# Patient Record
Sex: Female | Born: 1940
Health system: Southern US, Community
[De-identification: ages and names within clinical notes are randomized; demographics above are authoritative.]

## PROBLEM LIST (undated history)

## (undated) DIAGNOSIS — I10 Essential (primary) hypertension: Secondary | ICD-10-CM

## (undated) DIAGNOSIS — N200 Calculus of kidney: Secondary | ICD-10-CM

## (undated) DIAGNOSIS — E119 Type 2 diabetes mellitus without complications: Secondary | ICD-10-CM

## (undated) HISTORY — PX: APPENDECTOMY: SHX54

## (undated) HISTORY — PX: CHOLECYSTECTOMY: SHX55

## (undated) HISTORY — PX: ABDOMINAL HYSTERECTOMY: SHX81

---

## 2000-04-23 ENCOUNTER — Encounter: Payer: Self-pay | Admitting: Family Medicine

## 2000-04-23 ENCOUNTER — Ambulatory Visit (HOSPITAL_COMMUNITY): Admission: RE | Admit: 2000-04-23 | Discharge: 2000-04-23 | Payer: Self-pay | Admitting: Family Medicine

## 2000-12-10 ENCOUNTER — Encounter: Payer: Self-pay | Admitting: Emergency Medicine

## 2000-12-10 ENCOUNTER — Inpatient Hospital Stay (HOSPITAL_COMMUNITY): Admission: EM | Admit: 2000-12-10 | Discharge: 2000-12-12 | Payer: Self-pay | Admitting: Emergency Medicine

## 2000-12-22 ENCOUNTER — Encounter: Payer: Self-pay | Admitting: General Surgery

## 2000-12-28 ENCOUNTER — Encounter (INDEPENDENT_AMBULATORY_CARE_PROVIDER_SITE_OTHER): Payer: Self-pay

## 2000-12-28 ENCOUNTER — Inpatient Hospital Stay (HOSPITAL_COMMUNITY): Admission: RE | Admit: 2000-12-28 | Discharge: 2001-01-06 | Payer: Self-pay | Admitting: General Surgery

## 2001-02-11 ENCOUNTER — Ambulatory Visit (HOSPITAL_COMMUNITY): Admission: RE | Admit: 2001-02-11 | Discharge: 2001-02-11 | Payer: Self-pay | Admitting: *Deleted

## 2001-02-11 ENCOUNTER — Encounter: Payer: Self-pay | Admitting: *Deleted

## 2001-03-23 ENCOUNTER — Encounter: Payer: Self-pay | Admitting: *Deleted

## 2001-03-23 ENCOUNTER — Ambulatory Visit (HOSPITAL_COMMUNITY): Admission: RE | Admit: 2001-03-23 | Discharge: 2001-03-23 | Payer: Self-pay | Admitting: Oncology

## 2001-05-11 ENCOUNTER — Other Ambulatory Visit: Admission: RE | Admit: 2001-05-11 | Discharge: 2001-05-11 | Payer: Self-pay | Admitting: Family Medicine

## 2001-06-01 ENCOUNTER — Encounter: Payer: Self-pay | Admitting: Family Medicine

## 2001-06-01 ENCOUNTER — Ambulatory Visit (HOSPITAL_COMMUNITY): Admission: RE | Admit: 2001-06-01 | Discharge: 2001-06-01 | Payer: Self-pay | Admitting: Family Medicine

## 2001-07-09 ENCOUNTER — Ambulatory Visit (HOSPITAL_COMMUNITY): Admission: RE | Admit: 2001-07-09 | Discharge: 2001-07-09 | Payer: Self-pay | Admitting: *Deleted

## 2001-07-09 ENCOUNTER — Encounter: Payer: Self-pay | Admitting: *Deleted

## 2001-08-31 ENCOUNTER — Encounter: Payer: Self-pay | Admitting: *Deleted

## 2001-08-31 ENCOUNTER — Ambulatory Visit (HOSPITAL_COMMUNITY): Admission: RE | Admit: 2001-08-31 | Discharge: 2001-08-31 | Payer: Self-pay | Admitting: *Deleted

## 2001-10-26 ENCOUNTER — Ambulatory Visit (HOSPITAL_COMMUNITY): Admission: RE | Admit: 2001-10-26 | Discharge: 2001-10-26 | Payer: Self-pay | Admitting: *Deleted

## 2001-10-26 ENCOUNTER — Encounter: Payer: Self-pay | Admitting: *Deleted

## 2001-11-22 ENCOUNTER — Ambulatory Visit (HOSPITAL_COMMUNITY): Admission: RE | Admit: 2001-11-22 | Discharge: 2001-11-22 | Payer: Self-pay | Admitting: Family Medicine

## 2001-11-22 ENCOUNTER — Encounter: Payer: Self-pay | Admitting: Family Medicine

## 2002-01-20 ENCOUNTER — Encounter: Payer: Self-pay | Admitting: General Surgery

## 2002-01-28 ENCOUNTER — Inpatient Hospital Stay (HOSPITAL_COMMUNITY): Admission: RE | Admit: 2002-01-28 | Discharge: 2002-01-30 | Payer: Self-pay | Admitting: General Surgery

## 2002-02-22 ENCOUNTER — Encounter: Payer: Self-pay | Admitting: General Surgery

## 2002-02-22 ENCOUNTER — Ambulatory Visit (HOSPITAL_COMMUNITY): Admission: RE | Admit: 2002-02-22 | Discharge: 2002-02-22 | Payer: Self-pay | Admitting: General Surgery

## 2002-03-28 ENCOUNTER — Encounter: Payer: Self-pay | Admitting: Thoracic Surgery

## 2002-03-30 ENCOUNTER — Ambulatory Visit (HOSPITAL_COMMUNITY): Admission: RE | Admit: 2002-03-30 | Discharge: 2002-03-30 | Payer: Self-pay | Admitting: Thoracic Surgery

## 2002-03-30 ENCOUNTER — Encounter (INDEPENDENT_AMBULATORY_CARE_PROVIDER_SITE_OTHER): Payer: Self-pay | Admitting: Specialist

## 2002-03-30 ENCOUNTER — Encounter (INDEPENDENT_AMBULATORY_CARE_PROVIDER_SITE_OTHER): Payer: Self-pay | Admitting: *Deleted

## 2002-08-17 ENCOUNTER — Encounter: Payer: Self-pay | Admitting: *Deleted

## 2002-08-17 ENCOUNTER — Encounter: Payer: Self-pay | Admitting: Emergency Medicine

## 2002-08-17 ENCOUNTER — Inpatient Hospital Stay (HOSPITAL_COMMUNITY): Admission: EM | Admit: 2002-08-17 | Discharge: 2002-08-19 | Payer: Self-pay | Admitting: Emergency Medicine

## 2002-11-10 ENCOUNTER — Ambulatory Visit (HOSPITAL_BASED_OUTPATIENT_CLINIC_OR_DEPARTMENT_OTHER): Admission: RE | Admit: 2002-11-10 | Discharge: 2002-11-10 | Payer: Self-pay | Admitting: Family Medicine

## 2003-02-23 ENCOUNTER — Ambulatory Visit (HOSPITAL_COMMUNITY): Admission: RE | Admit: 2003-02-23 | Discharge: 2003-02-23 | Payer: Self-pay | Admitting: Oncology

## 2003-02-23 ENCOUNTER — Encounter: Payer: Self-pay | Admitting: Oncology

## 2003-04-18 ENCOUNTER — Ambulatory Visit (HOSPITAL_COMMUNITY): Admission: RE | Admit: 2003-04-18 | Discharge: 2003-04-18 | Payer: Self-pay | Admitting: Family Medicine

## 2003-05-09 ENCOUNTER — Ambulatory Visit (HOSPITAL_COMMUNITY): Admission: RE | Admit: 2003-05-09 | Discharge: 2003-05-09 | Payer: Self-pay | Admitting: Unknown Physician Specialty

## 2003-08-05 ENCOUNTER — Inpatient Hospital Stay (HOSPITAL_COMMUNITY): Admission: RE | Admit: 2003-08-05 | Discharge: 2003-08-07 | Payer: Self-pay | Admitting: General Surgery

## 2003-09-28 ENCOUNTER — Ambulatory Visit (HOSPITAL_COMMUNITY): Admission: RE | Admit: 2003-09-28 | Discharge: 2003-09-28 | Payer: Self-pay | Admitting: Oncology

## 2004-03-28 ENCOUNTER — Ambulatory Visit (HOSPITAL_COMMUNITY): Admission: RE | Admit: 2004-03-28 | Discharge: 2004-03-28 | Payer: Self-pay | Admitting: Oncology

## 2004-08-09 ENCOUNTER — Ambulatory Visit (HOSPITAL_COMMUNITY): Admission: RE | Admit: 2004-08-09 | Discharge: 2004-08-09 | Payer: Self-pay | Admitting: Neurology

## 2004-09-25 ENCOUNTER — Ambulatory Visit: Payer: Self-pay | Admitting: Oncology

## 2004-09-30 ENCOUNTER — Ambulatory Visit (HOSPITAL_COMMUNITY): Admission: RE | Admit: 2004-09-30 | Discharge: 2004-09-30 | Payer: Self-pay | Admitting: Oncology

## 2005-02-02 IMAGING — CT CT ABDOMEN W/ CM
2 of 3 series · 12 of 32 positions shown, 17 images · IV contrast (agent unspecified)
Comparison: 10/26/01.

FINDINGS
CLINICAL DATA: EPIGASTRIC PAIN.
CT ABDOMEN AND PELVIS WITH CONTRAST

[Series 2: chest/pe 3.0 b10f · axial · 0.61mm/px · z∈[-89,+79]mm · 8 of 70 slices shown]
[im 7/70  soft-tissue]
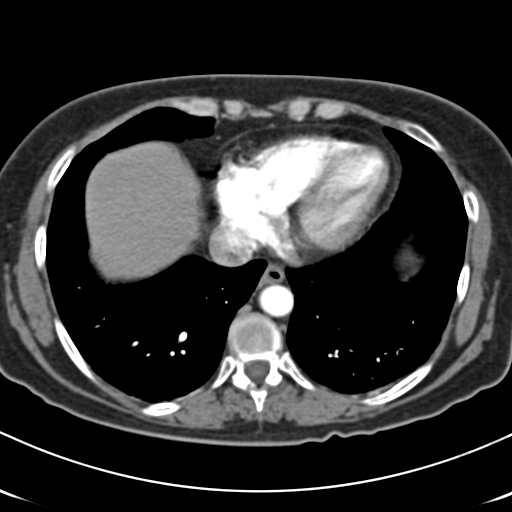
[im 13/70  soft-tissue]
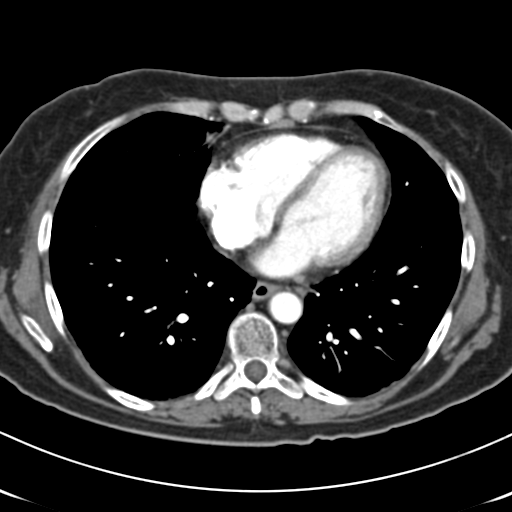
[im 22/70  soft-tissue]
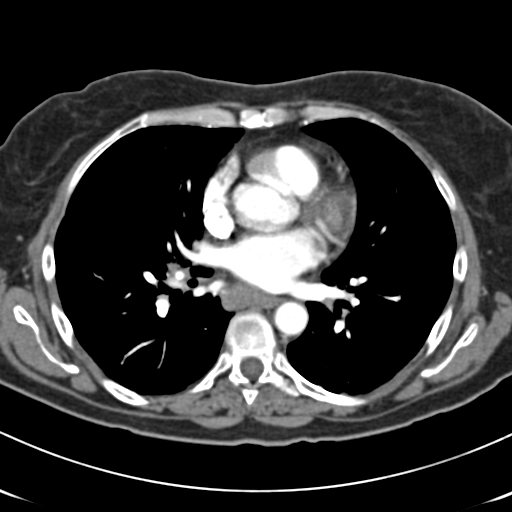
[im 32/70  soft-tissue]
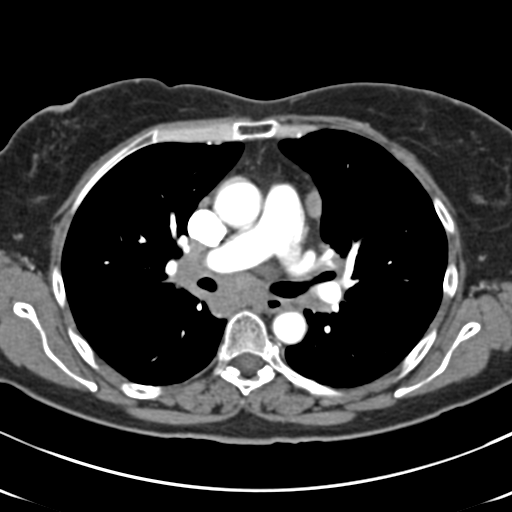
[im 38/70  soft-tissue]
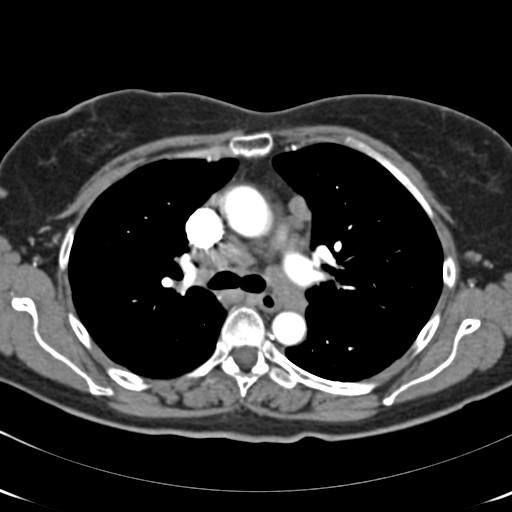
[im 48/70  soft-tissue]
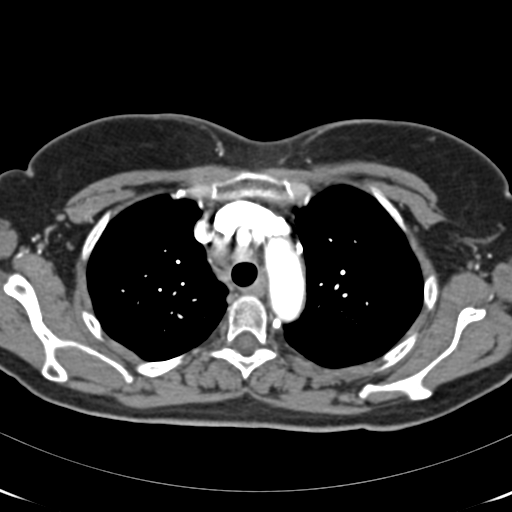
[im 57/70  soft-tissue]
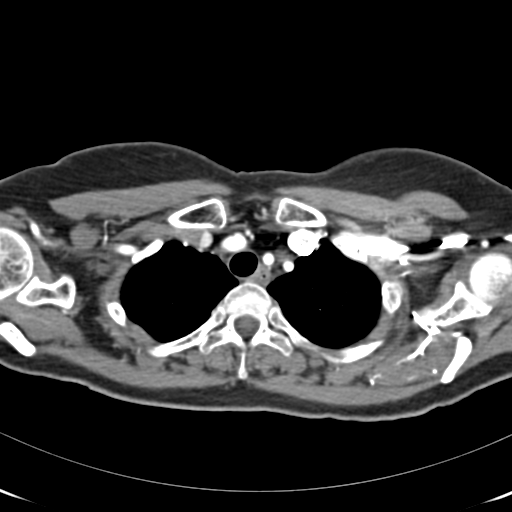
[im 63/70  soft-tissue]
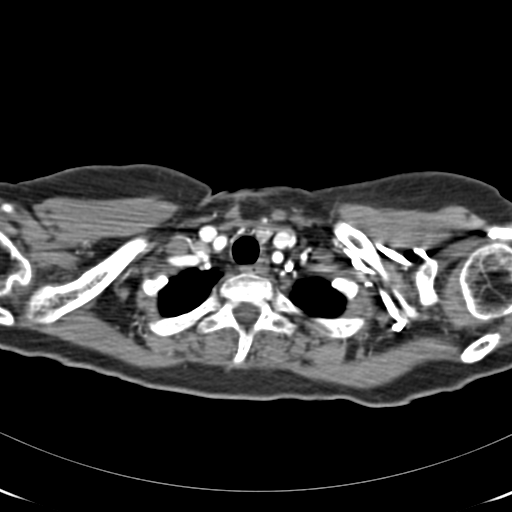

[Series 5: liv to kidn 5.0 b10f · axial · 0.61mm/px · z∈[-182,-127]mm · 4 of 19 slices shown, 9 images]
[im 4/19  soft-tissue]
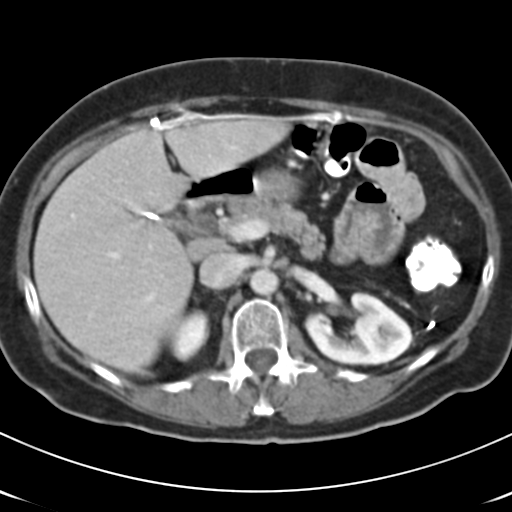
[im 4/19  lung]
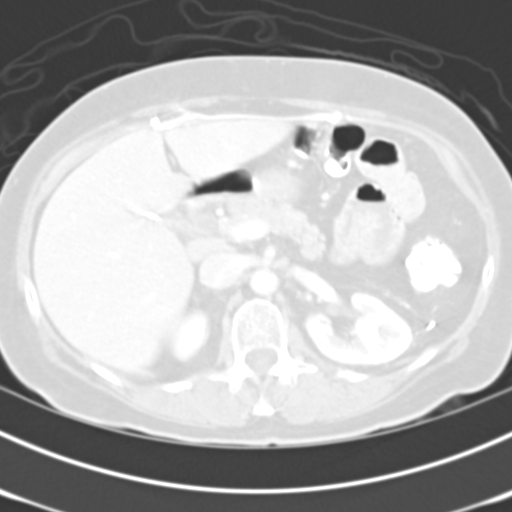
[im 4/19  bone]
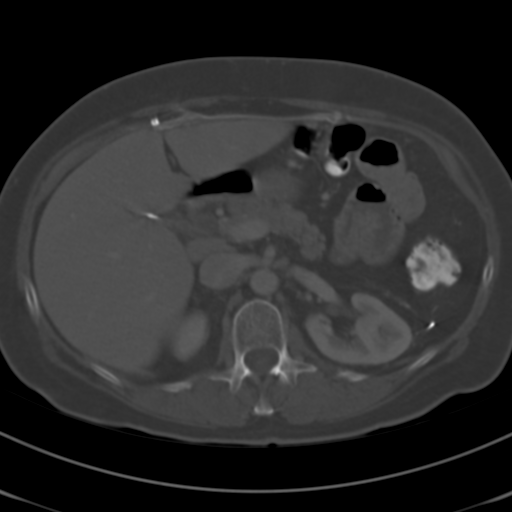
[im 8/19  soft-tissue]
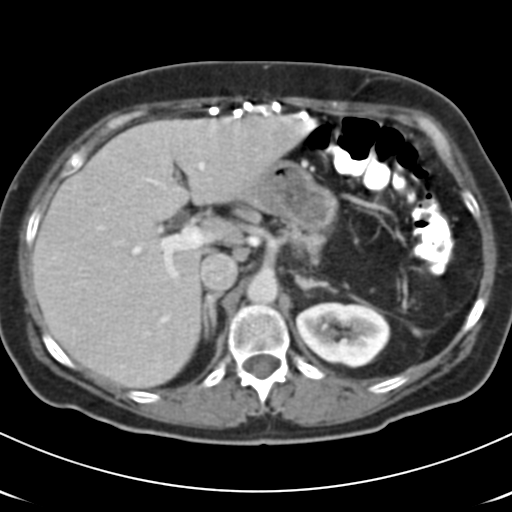
[im 8/19  lung]
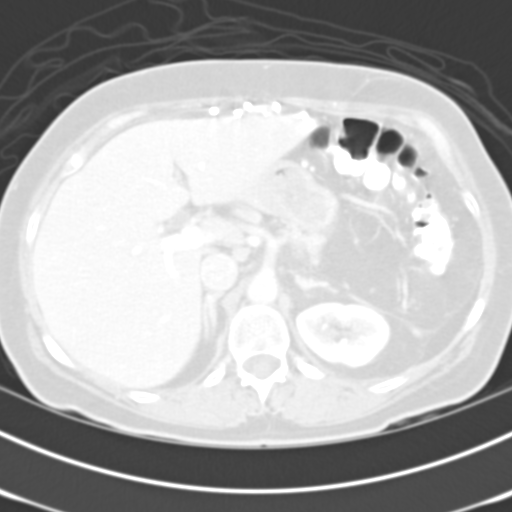
[im 11/19  soft-tissue]
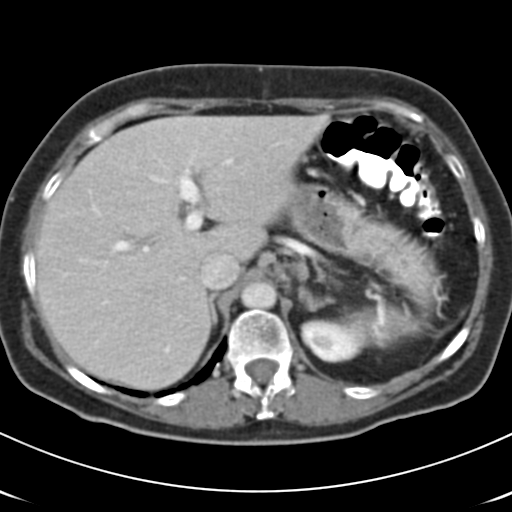
[im 11/19  lung]
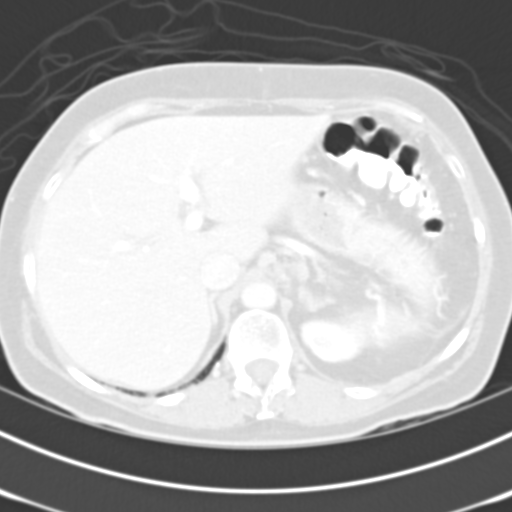
[im 15/19  soft-tissue]
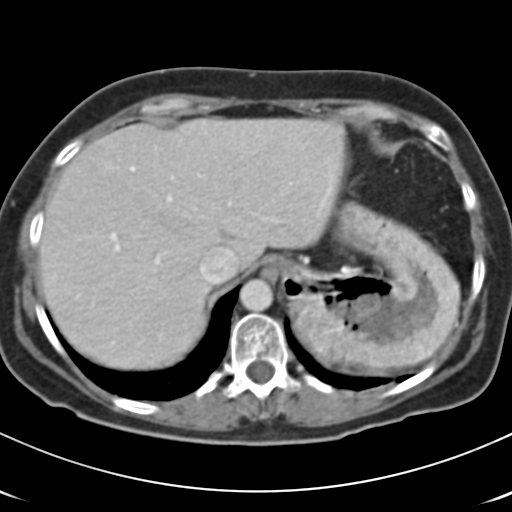
[im 15/19  lung]
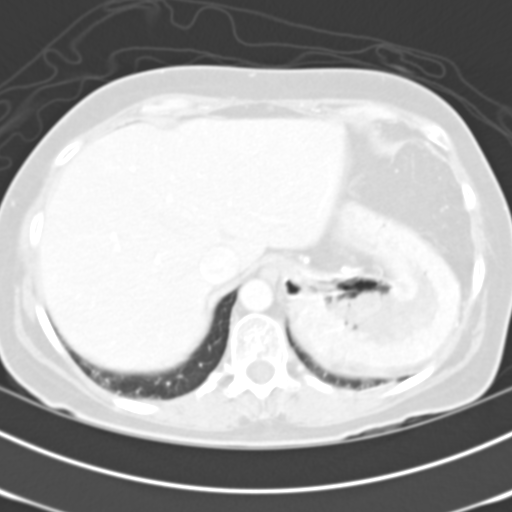

[12 of 32 positions shown; findings below may reference images not displayed]

AFTER THE INTRAVENOUS INJECTION OF 150 CC OMNIPAQUE 300, 5 MM SPIRAL IMAGES WERE OBTAINED THROUGH
THE ABDOMEN AND PELVIS.
CT ABDOMEN
ONCE AGAIN, THE GALLBLADDER, PANCREATIC TAIL AND SPLEEN ARE SURGICALLY ABSENT.  PERIPORTAL
ADENOPATHY IS STABLE.  ON IMAGE 21 THERE IS A 9 MM SHORT-AXIS DIAMETER LYMPH NODE IN THE PERIPORTAL
REGION.  ON IMAGE 24 THERE IS A 1.2 CM SHORT-AXIS DIAMETER PORTOCAVAL LYMPH NODE.  PARA-AORTIC VERY
SMALL LYMPH NODES ARE NOTED, AND ARE ALSO STABLE.  THE LARGEST IS ON IMAGE 32 AND MEASURES
APPROXIMATELY 8 MM IN SHORT-AXIS DIAMETER.  THE GALLBLADDER IS AGAIN NOTED TO BE SURGICALLY ABSENT,
WITH PROMINENCE OF THE COMMON BILE DUCT SECONDARY TO CHOLECYSTECTOMY.  THE ANTERIOR ABDOMINAL WALL
HERNIA HAS BEEN SURGICALLY FIXED WITH MESH.  THE KIDNEYS AND ADRENAL GLANDS ARE WITHIN NORMAL
LIMITS.  THERE IS NO EVIDENCE OF FREE FLUID.
IMPRESSION
1.  POST-SURGICAL CHANGE, AS DESCRIBED, WITH RESECTION OF THE PANCREATIC TAIL AND SPLEEN.
2.  STABLE PERIPORTAL ADENOPATHY.
3.  VENTRAL ABDOMINAL WALL HERNIORRHAPHY.
4.  EXAM OTHERWISE STABLE.
CT PELVIS
IN THE PELVIS THE BLADDER IS DISTENDED.  THERE IS NO EVIDENCE OF FLUID.  THERE IS NO EVIDENCE OF
ADENOPATHY. THE UTERUS IS SURGICALLY ABSENT.
IMPRESSION
NO EVIDENCE OF ACUTE INTRAPELVIC DISEASE.

## 2005-03-18 ENCOUNTER — Observation Stay (HOSPITAL_COMMUNITY): Admission: EM | Admit: 2005-03-18 | Discharge: 2005-03-19 | Payer: Self-pay | Admitting: Emergency Medicine

## 2005-04-04 ENCOUNTER — Ambulatory Visit: Payer: Self-pay | Admitting: Oncology

## 2005-04-09 ENCOUNTER — Ambulatory Visit (HOSPITAL_COMMUNITY): Admission: RE | Admit: 2005-04-09 | Discharge: 2005-04-09 | Payer: Self-pay | Admitting: Pediatrics

## 2005-06-02 ENCOUNTER — Ambulatory Visit (HOSPITAL_COMMUNITY): Admission: RE | Admit: 2005-06-02 | Discharge: 2005-06-02 | Payer: Self-pay | Admitting: Oncology

## 2005-10-14 ENCOUNTER — Ambulatory Visit: Payer: Self-pay | Admitting: Oncology

## 2005-10-15 LAB — COMPREHENSIVE METABOLIC PANEL
ALT: 19 U/L (ref 0–40)
AST: 31 U/L (ref 0–37)
Albumin: 4.4 g/dL (ref 3.5–5.2)
Alkaline Phosphatase: 39 U/L (ref 39–117)
Potassium: 4.7 mEq/L (ref 3.5–5.3)
Sodium: 141 mEq/L (ref 135–145)
Total Protein: 7 g/dL (ref 6.0–8.3)

## 2005-10-15 LAB — CBC WITH DIFFERENTIAL/PLATELET
BASO%: 1.6 % (ref 0.0–2.0)
EOS%: 1.6 % (ref 0.0–7.0)
MCH: 32.3 pg (ref 26.0–34.0)
MCV: 94.5 fL (ref 81.0–101.0)
MONO%: 7.5 % (ref 0.0–13.0)
RBC: 4.64 10*6/uL (ref 3.70–5.32)
RDW: 12.8 % (ref 11.3–14.5)
lymph#: 2.2 10*3/uL (ref 0.9–3.3)

## 2005-10-17 ENCOUNTER — Ambulatory Visit (HOSPITAL_COMMUNITY): Admission: RE | Admit: 2005-10-17 | Discharge: 2005-10-17 | Payer: Self-pay | Admitting: Oncology

## 2006-10-09 ENCOUNTER — Ambulatory Visit: Payer: Self-pay | Admitting: Oncology

## 2006-10-14 LAB — CBC WITH DIFFERENTIAL/PLATELET
BASO%: 1.3 % (ref 0.0–2.0)
HCT: 46.5 % (ref 34.8–46.6)
LYMPH%: 33.1 % (ref 14.0–48.0)
MCH: 30.7 pg (ref 26.0–34.0)
MCHC: 33.6 g/dL (ref 32.0–36.0)
MCV: 91.6 fL (ref 81.0–101.0)
MONO#: 0.5 10*3/uL (ref 0.1–0.9)
NEUT%: 57.3 % (ref 39.6–76.8)
Platelets: 409 10*3/uL — ABNORMAL HIGH (ref 145–400)
WBC: 8.6 10*3/uL (ref 3.9–10.0)

## 2006-10-14 LAB — COMPREHENSIVE METABOLIC PANEL
ALT: 20 U/L (ref 0–35)
CO2: 22 mEq/L (ref 19–32)
Creatinine, Ser: 0.69 mg/dL (ref 0.40–1.20)
Glucose, Bld: 134 mg/dL — ABNORMAL HIGH (ref 70–99)
Total Bilirubin: 0.4 mg/dL (ref 0.3–1.2)

## 2006-10-14 LAB — CANCER ANTIGEN 19-9: CA 19-9: 3.7 U/mL (ref <35.0)

## 2006-10-14 LAB — LACTATE DEHYDROGENASE: LDH: 140 U/L (ref 94–250)

## 2006-10-15 ENCOUNTER — Ambulatory Visit (HOSPITAL_COMMUNITY): Admission: RE | Admit: 2006-10-15 | Discharge: 2006-10-15 | Payer: Self-pay | Admitting: Oncology

## 2007-10-23 ENCOUNTER — Inpatient Hospital Stay (HOSPITAL_COMMUNITY): Admission: EM | Admit: 2007-10-23 | Discharge: 2007-10-25 | Payer: Self-pay | Admitting: Emergency Medicine

## 2007-10-25 ENCOUNTER — Encounter (INDEPENDENT_AMBULATORY_CARE_PROVIDER_SITE_OTHER): Payer: Self-pay | Admitting: Internal Medicine

## 2007-10-25 ENCOUNTER — Ambulatory Visit: Payer: Self-pay | Admitting: Vascular Surgery

## 2007-11-12 ENCOUNTER — Ambulatory Visit (HOSPITAL_COMMUNITY): Admission: RE | Admit: 2007-11-12 | Discharge: 2007-11-12 | Payer: Self-pay | Admitting: Family Medicine

## 2008-12-21 ENCOUNTER — Encounter: Admission: RE | Admit: 2008-12-21 | Discharge: 2008-12-21 | Payer: Self-pay | Admitting: Family Medicine

## 2009-06-20 ENCOUNTER — Encounter: Admission: RE | Admit: 2009-06-20 | Discharge: 2009-06-20 | Payer: Self-pay | Admitting: Gastroenterology

## 2009-07-10 ENCOUNTER — Encounter: Admission: RE | Admit: 2009-07-10 | Discharge: 2009-07-10 | Payer: Self-pay | Admitting: Gastroenterology

## 2009-08-13 ENCOUNTER — Ambulatory Visit (HOSPITAL_COMMUNITY): Admission: RE | Admit: 2009-08-13 | Discharge: 2009-08-13 | Payer: Self-pay | Admitting: Gastroenterology

## 2009-08-27 ENCOUNTER — Telehealth: Payer: Self-pay | Admitting: Gastroenterology

## 2010-02-19 ENCOUNTER — Encounter: Payer: Self-pay | Admitting: Gastroenterology

## 2010-06-16 DIAGNOSIS — D869 Sarcoidosis, unspecified: Secondary | ICD-10-CM

## 2010-06-16 DIAGNOSIS — C259 Malignant neoplasm of pancreas, unspecified: Secondary | ICD-10-CM

## 2010-06-16 DIAGNOSIS — Z9081 Acquired absence of spleen: Secondary | ICD-10-CM

## 2010-06-16 HISTORY — DX: Malignant neoplasm of pancreas, unspecified: C25.9

## 2010-06-16 HISTORY — PX: SPLENECTOMY: SUR1306

## 2010-06-16 HISTORY — DX: Acquired absence of spleen: Z90.81

## 2010-06-16 HISTORY — PX: PARTIAL COLECTOMY: SHX5273

## 2010-06-16 HISTORY — PX: PANCREATECTOMY: SHX1019

## 2010-06-16 HISTORY — DX: Sarcoidosis, unspecified: D86.9

## 2010-07-06 ENCOUNTER — Encounter: Payer: Self-pay | Admitting: Oncology

## 2010-07-07 ENCOUNTER — Encounter: Payer: Self-pay | Admitting: Gastroenterology

## 2010-07-07 ENCOUNTER — Encounter: Payer: Self-pay | Admitting: Oncology

## 2010-07-18 NOTE — Progress Notes (Signed)
Summary: triage  Phone Note From Other Clinic Call back at [phone]   Caller: Talbert Forest from [place] at [facility] [address] Laur[facility] Montana Call For: Nicole Montgomery Reason for Call: Schedule Patient Appt Summary of Call: Dr Cliffton Asters would like this patient seen before firat available appt 4-12 do to dysphagia Initial call taken by: Tawni Levy,  August 27, 2009 4:38 PM  Follow-up for Phone Call        Per Talbert Forest,  Dr. Cliffton Asters would like pt to be seen this week.   Appt sch with Rozetta Nunnery, NP for 08/30/09.  Records will be faxed to [place]. Follow-up by: Ashok Cordia RN,  August 27, 2009 4:52 PM     Appended Document: triage After reviewing records, pt does not need OV with NP.  Pt notified.  Will have Dr. Jarold Montgomery review records and advise pt of his opinion.  Appt with Rozetta Nunnery cancelled.  Appended Document: triage Per Dr. Jarold Montgomery.  No need for OV here.  Pt has had complete work up.  Pt notified and Talbert Forest at [place] notified.

## 2010-07-18 NOTE — Miscellaneous (Signed)
Summary: Ikran Patman DECLINED TO ACCEPT  Dr. Jarold Motto reviewed the patients records and declined to see that patient, I found the referring records on my desk.

## 2010-08-28 ENCOUNTER — Other Ambulatory Visit: Payer: Self-pay | Admitting: Family Medicine

## 2010-08-28 DIAGNOSIS — M545 Low back pain, unspecified: Secondary | ICD-10-CM

## 2010-08-28 DIAGNOSIS — M79604 Pain in right leg: Secondary | ICD-10-CM

## 2010-08-30 ENCOUNTER — Ambulatory Visit
Admission: RE | Admit: 2010-08-30 | Discharge: 2010-08-30 | Disposition: A | Discharge status: Home / Self Care | Payer: Medicare Other | Source: Ambulatory Visit | Attending: Family Medicine | Admitting: Family Medicine

## 2010-08-30 DIAGNOSIS — M545 Low back pain, unspecified: Secondary | ICD-10-CM

## 2010-08-30 DIAGNOSIS — M79604 Pain in right leg: Secondary | ICD-10-CM

## 2010-10-17 ENCOUNTER — Other Ambulatory Visit: Payer: Self-pay | Admitting: Family Medicine

## 2010-10-18 ENCOUNTER — Ambulatory Visit
Admission: RE | Admit: 2010-10-18 | Discharge: 2010-10-18 | Disposition: A | Discharge status: Home / Self Care | Payer: Medicare Other | Source: Ambulatory Visit | Attending: Family Medicine | Admitting: Family Medicine

## 2010-10-18 MED ORDER — IOHEXOL 300 MG/ML  SOLN
100.0000 mL | Freq: Once | INTRAMUSCULAR | Status: AC | PRN
  Administered 2010-10-18: 100 mL via INTRAVENOUS

## 2010-10-23 ENCOUNTER — Other Ambulatory Visit: Payer: Self-pay | Admitting: Neurosurgery

## 2010-10-23 DIAGNOSIS — M533 Sacrococcygeal disorders, not elsewhere classified: Secondary | ICD-10-CM

## 2010-10-24 ENCOUNTER — Ambulatory Visit
Admission: RE | Admit: 2010-10-24 | Discharge: 2010-10-24 | Disposition: A | Discharge status: Home / Self Care | Payer: MEDICARE | Source: Ambulatory Visit | Attending: Neurosurgery | Admitting: Neurosurgery

## 2010-10-24 DIAGNOSIS — M533 Sacrococcygeal disorders, not elsewhere classified: Secondary | ICD-10-CM

## 2010-10-29 NOTE — H&P (Signed)
NAME:  Nicole Montgomery, Nicole Montgomery                ACCOUNT NO.:  [id number]   MEDICAL RECORD NO.:  [id number]          PATIENT TYPE:  EMS   LOCATION:  ED                           FACILITY:  [facility]   PHYSICIAN:  Kela Millin, M.D.DATE OF BIRTH:  1940-07-02   DATE OF ADMISSION:  10/23/2007  DATE OF DISCHARGE:                              HISTORY & PHYSICAL   PRIMARY CARE PHYSICIAN:  Dr. Laurann Montana.   CHIEF COMPLAINT:  Slurred speech with lack of coordination on right  side/right-sided weakness.   HISTORY OF PRESENT ILLNESS:  The patient is a 70 year old white female  with a past medical history significant for hypertension, sarcoidosis,  recurrent migraine headaches, multiple DRUG ALLERGIES and a history of  adenocarcinoma of the pancreas status post splenectomy, pancreatectomy  and partial colectomy who presents with the above complaints.  She  reports that she was in her usual state of health until yesterday at  3:30 p.m. when she woke up from her nap and noticed that she was having  difficulty with coordination in her right arm and leg with difficulty  walking and she felt weak on the right side.  She also states that her  speech was slurred and her husband presents with her and states that she  still has some slurred speech present, but it is improved from  yesterday.  She admits to sinus headaches, felt a headache over her  frontal sinus area.  She denies blurry vision, dysphagia, fevers,  dysuria, diarrhea, melena and no hematochezia.  She also denies chest  pain.  Ms. Suen states that she was previously on blood pressure  medications, as well as Tricor, but stopped the medications about a year  ago and has not been taking any because she was having a choking  feeling and so she proceeded to stop one medication after the other  until she had stopped all of them and her symptoms resolved and so she  has just not taken any of them since then.   She was seen in the ER and a CT scan of  her brain was done which was  negative for bleed and no other acute intracranial process noted.  She  is admitted for further evaluation and management.   PAST MEDICAL HISTORY:  1. As above.  2. History of nephrolithiasis.   PAST MEDICAL HISTORY:  History of ventral hernia repair, status post  hysterectomy, a history of cholecystectomy, a history of appendectomy.   MEDICATIONS:  None.  (Per her last discharge summary in 2006, she was on  Procardia XL, Tricor, Maxzide and baclofen.)   ALLERGIES:  CODEINE, DILANTIN, LYRICA, NEURONTIN, PERCOCET, TOPAMAX AND  TEGRETOL.   SOCIAL HISTORY:  Denies tobacco, also denies alcohol.   FAMILY HISTORY:  Her brother and sister have had strokes and she has 2  sisters with hypertension.   REVIEW OF SYSTEMS:  As per HPI, other review of systems negative.   PHYSICAL EXAM:  GENERAL:  The patient is an elderly white female in no  apparent distress.  No facial asymmetry noted.  VITAL SIGNS:  Her temperature is  98 with a blood pressure of 141/85,  pulse of 88, respiratory rate of 16, O2 sat of 96%.  HEENT:  PERRL, EOMI, moist mucous membranes and no facial asymmetry.  NECK:  Supple, no adenopathy, no thyromegaly, no JVD and no carotid  bruits appreciated.  LUNGS:  Clear to auscultation bilaterally.  No crackles or wheezes.  CARDIOVASCULAR:  Regular rate and rhythm.  Normal S1, S2.  No gallops  and no murmurs appreciated.  ABDOMEN:  Soft, bowel sounds present, nontender, nondistended.  No  organomegaly and no masses palpable.  EXTREMITIES:  No cyanosis and no edema.  NEURO:  She is alert and oriented x3.  Cranial nerves II-XII are grossly  intact.  Her strength is 4-5/5 and symmetric.  Plantar reflexes are  equivocal.  Her finger-to-nose is within normal limits bilaterally.  No  pronator drift present.   LABORATORY DATA:  White cell count is 9.1 with a hemoglobin of 16.4,  hematocrit 49.9, platelet count 293 with a neutrophil count of 69%.  Sodium  141, potassium 4.2, chloride 105 with a CO2 of 27, glucose 120,  BUN 18, creatinine 0.8, calcium of 9.8.  Her INR is 0.9.  Urinalysis  negative for infection.  CT scan of head as per HPI.   ASSESSMENT AND PLAN:  1. Slurred speech/lack of coordination/right-sided weakness - as      discussed above, no significant weakness noted on exam.  Will admit      for further evaluation of possible cerebrovascular accident.  Will      obtain an MRI/MRA of head and neck, 2D echocardiogram and also      homocysteine and fasting lipid profile.  Will start the patient on      aspirin.  Follow and consult neurology pending above further      recommendations.  2. Hypertension - as discussed above, has not been on any medications      for about a year.  Will monitor blood pressures and manage as      appropriate.  3. History of adenocarcinoma of the pancreas - status post surgery.  4. History of recurrent migraines.  5. History of sarcoidosis.  6. MULTIPLE DRUG ALLERGIES.      Kela Millin, M.D.  Electronically Signed     ACV/MEDQ  D:  10/23/2007  T:  10/23/2007  Job:  [id number]   cc:   Stacie Acres. Cliffton Asters, M.D.  Fax: [phone]

## 2010-11-01 NOTE — Discharge Summary (Signed)
   NAME:  Nicole Montgomery, Nicole Montgomery                     ACCOUNT NO.:  [id number]   MEDICAL RECORD NO.:  [id number]                   PATIENT TYPE:  INP   LOCATION:  0377                                 FACILITY:  [facility]   PHYSICIAN:  Adolph Pollack, M.D.            DATE OF BIRTH:  1941/04/17   DATE OF ADMISSION:  01/27/2002  DATE OF DISCHARGE:  01/30/2002                                 DISCHARGE SUMMARY   PRINCIPAL DISCHARGE DIAGNOSES:  Ventral incisional hernia.   SECONDARY DIAGNOSES:  1. Cystadenocarcinoma of the pancreas.  2. Gastroesophageal reflux disease.  3. Migraine headaches.  4. Raynaud's disease.   PROCEDURE:  Laparoscopic repair of ventral incisional hernia with mesh  January 27, 2002.   REASON FOR ADMISSION:  The patient is a 70 year old female who underwent a  distal pancreatectomy and splenectomy for a cystadenocarcinoma of the  pancreas.  She was noticing an increasing bulge around the mid portion of  her incision and upon examination she had an incisional hernia and was  admitted for repair.   HOSPITAL COURSE:  She underwent the above procedure.  Postoperatively she  had quite a bit of soreness and was having intermittent hypotensive  episodes, but no tachycardia.  She had been taking Procardia for migraine  headaches and this was held and blood pressure improved.  She was instructed  to hold this at home and by her third postoperative day her blood pressure  was much improved.  She looked better and was able to be discharged.   DISPOSITION:  Discharged to home January 30, 2002 in satisfactory condition.  She was told to hold her Procardia and Maxzide for two days.  She was given  specific activity restrictions and Tylox for pain.  She will come back to  see me in the office in three weeks and call sooner if she has any problems.                                               Adolph Pollack, M.D.    Kari Baars  D:  02/11/2002  T:  02/11/2002  Job:  [id number]

## 2010-11-01 NOTE — Op Note (Signed)
NAME:  Nicole Montgomery, Nicole Montgomery                          ACCOUNT NO.:  [id number]   MEDICAL RECORD NO.:  [id number]                   PATIENT TYPE:  AMB   LOCATION:  DAY                                  FACILITY:  [facility]   PHYSICIAN:  Adolph Pollack, M.D.            DATE OF BIRTH:  December 22, 1940   DATE OF PROCEDURE:  DATE OF DISCHARGE:                                 OPERATIVE REPORT   PREOPERATIVE DIAGNOSIS:  Macular refractory gastroesophageal reflux disease  with a hiatal hernia.   POSTOPERATIVE DIAGNOSIS:  Macular refractory gastroesophageal reflux disease  with a hiatal hernia.   PROCEDURE:  1. Laparoscopic hiatal hernia and Nissen fundoplication.  2. Extensive laparoscopic lysis of adhesions (taking at least one hour).   SURGEON:  Adolph Pollack, M.D.   ASSISTANT:  Thornton Park. Daphine Deutscher, M.D.   ANESTHESIA:  General.   INDICATIONS:  Nicole Montgomery is a 70 year old female who has had multiple upper  abdominal surgeries.  She has medically refractory gastroesophageal reflux  disease as well as hiatal hernia.  She now presents for attempted  transabdominal repair via laparoscopic open technique.   TECHNIQUE:  She was seen in the holding area, then brought to the operating  room.  Placed supine on the operating room table where general anesthetic  was administered.  A Foley catheter was placed in the bladder.  Her  abdominal wall was sterilely prepped and draped.  A previous subumbilical  incision was re-incised sharply through the skin and subcutaneous tissue.  A  small incision made in the fascia of the peritoneum until the peritoneal  cavity was entered under direct vision.  A purse-string suture of 0 Vicryl  was placed around the fascial edges.  A Hasson trocar was introduced into  the peritoneal cavity, and pneumoperitoneum was created by insufflation by  CO2 gas.   Next, a 0 degree laparoscope was introduced.  Omental adhesions were noted  fairly significantly to the anterior  abdominal wall.  In the right lower  quadrant, a small incision was made, and a 5 mm trocar introduced through  this under laparoscopic vision.  Using the harmonic scalpel, I spent time  taking down the omental adhesions very carefully and the entire abdominal  wall above the umbilicus.  The previous mesh was noted.  The adhesions were  adherent to it, but they did come off with the harmonic scalpel.  This took  approximately 45 minutes.  I was then able to place a trocar in the left  upper quadrant lateral to the mesh of 5 mm size, and also a 5 mm trocar in  the right upper quadrant lateral to the mesh.  In the left mid abdomen, I  placed a 1011 trocar inferior to the mesh as well, so the mesh was not  violated with any of the trocars.   I then identified the stomach.  I incised a thin area of the gastrohepatic  ligament up towards the right crus.  I was then able to carefully identify  the right crus, and using blunt dissection, separate the esophagus from the  right crus.  Some of the pharyngoesophageal tissue was then incised  anteriorly over the esophagus.  Using careful blunt dissection, I made a  retroesophageal window.  I was not able to get all the way through to the  left side.  I subsequently approached the left side of the esophagus and  identified the left crus and used a harmonic scalpel with careful blunt  dissection.  I freed up adhesions between the esophagus and the crus.  I  then began mobilizing the stomach, which was adherent to the splenic bed  posteriorly and to the lateral abdominal wall and diaphragm laterally and  superiorly.  This took another 15-20 minutes of adhesiolysis before I could  completely free the stomach up.  Then I got it to be fairly mobile.  I also  was able to complete the retroesophageal window and passed the Penrose drain  inferior around this and retracted the esophagus anteriorly, exposing the  hiatal hernia.  I closed the hiatal hernia with  interrupted sutures of  nonabsorbable type.   Following this, I attempted to grasp the fundus of the stomach and retract  it through the retroesophageal window, but there are still some posterior  adhesions, which took another 5-10 minutes to take down; however, once we  did this, I was able to pull part of the fundus of the stomach through the  retroesophageal window and approximate a left side of the wrap to the right  side of the wrap.  Under laparoscopic vision, a size 50 _________ bougie was  passed into the esophagus.  I then created a 360 degree fundoplication using  interrupted 0 nonabsorbable sutures.  The first two bites of the wrap  included the left leaf of the wrap and part of the esophagus and the right  leaf of the wrap.  The last bite of the wrap, included just the left and  right leafs of the wrap measured approximately 2 to 2.5 cm.  The bougie was  removed intact.  Fundoplication was noted to be floppy, under no tension.   I then inspected the hiatus, and then the closure was intact and adequate.  I evacuated thin, bloody-type fluid but no active bleeding was noted.  I  then removed all of the instruments.  I subsequently removed all of the  trocars and released the pneumoperitoneum.  The subumbilical fascial defect  was closed by tightening up and tying down the purse-string suture.  The  skin incisions were closed with 4-0 Monocryl subcuticular stitches.  Steri-  Strips and sterile dressings were applied.  She tolerated the procedure well  without any apparent complications.  She subsequently was extubated and  taken to the recovery room in satisfactory condition.                                               Adolph Pollack, M.D.    Kari Baars  D:  08/04/2003  T:  08/04/2003  Job:  [id number]   cc:   Stacie Acres. White, M.D.  [address]  [place]  [place] [address]  Fax: [phone]   Vania Rea. Jarold Motto, M.D. [facility]   Pierce Crane, M.D. [address]  [address]  [facility]  [place]  [place] [address]  Fax: [phone]

## 2010-11-01 NOTE — H&P (Signed)
NAME:  Nicole Montgomery, Nicole Montgomery                          ACCOUNT NO.:  [id number]   MEDICAL RECORD NO.:  [id number]                   PATIENT TYPE:  OBV   LOCATION:  0366                                 FACILITY:  [facility]   PHYSICIAN:  Adolph Pollack, M.D.            DATE OF BIRTH:  1940/06/27   DATE OF ADMISSION:  08/04/2003  DATE OF DISCHARGE:                                HISTORY & PHYSICAL   HISTORY OF PRESENT ILLNESS:  Ms. Bieri is a 70 year old female who has been  having progressively increasing heartburn.  It started about 15 years ago.  She is currently now on prescription medication (Prevacid) twice a day, but  continues to have breakthrough symptoms.  She has no supraesophageal  manifestations of her disease.  She has undergone fairly extensive  evaluation, including a 24 hour pH study which was positive.  Upper  endoscopy demonstrated a hiatal hernia, but no severe esophagitis or  stricture.  Upper GI was done, no reflux was demonstrated.  Manometry  demonstrated normal peristalsis and slightly decreased LES pressure.  She  had multiple previous operations before, briefly thought about endoluminal  treatment with her, although the results on this are mixed.  We discussed  attempting a laparoscopic or transabdominal hiatal hernia repair and Nissen  fundoplication.  There is a potential, I told her we could not do this way  given her previous operations, and she may have to go for a thoracoscopic  approach and she is understanding of that.  She now presents for this  procedure.   PAST MEDICAL HISTORY:  1. Distal pancreatic cancer.  2. Gastroesophageal reflux disease.  3. Hiatal hernia.  4. Osteoporosis.  5. Hypercholesterolemia.  6. Hypertension.  7. Sarcoidosis.  8. Raynaud's syndrome.  9. Nephrolithiasis.  10.      Migraine headaches.  11.      Squamous cell carcinoma of the skin.   PAST SURGICAL HISTORY:  1. Excision of skin cancers.  2. Distal pancreatectomy and  splenectomy.  3. Hysterectomy.  4. Cholecystectomy and appendectomy.  5. Left cervical lymph node biopsy.  6. Laparoscopic ventral hernia repair with mesh.   ALLERGIES:  1. PERCOCET causes nausea.  2. TEGRETOL.  3. ASPIRIN.   CURRENT MEDICATIONS:  1. Procardia XL 60 mg daily.  2. Prevacid 30 mg b.i.d.  3. Tricor 160 mg daily.  4. Maxzide/HCTZ 37.5/25 mg daily.  5. Hyoscyamine 20 mg p.r.n.  6. Duragesic Plus.   SOCIAL HISTORY:  She is married.  No tobacco or alcohol use.   REVIEW OF SYSTEMS:  Attached to the chart and evaluated and examined by me.   PHYSICAL EXAMINATION:  GENERAL:  A thin female in no acute distress.  Pleasant and cooperative.  VITAL SIGNS:  Her temperature is 98, blood pressure is 98/72, pulse of 80.  SKIN:  Warm and dry without rash, no jaundice present.  HEENT:  Extraocular movements were intact.  No icterus.  NECK:  Supple.  No obvious masses.  Well-healed left-sided scar.  CARDIOVASCULAR:  Regular rate and rhythm without murmur.  RESPIRATORY:  Breath sounds equal and clear, respirations non-labored.  ABDOMEN:  Soft, well-healed right upper quadrant incision, also bucket  handle type incision is well-healed.  A small incision anterior to this, and  one subumbilical incision well-healed.  No hernias or palpable masses.  EXTREMITIES:  Some reddish discoloration of the hands and fingers, no edema,  full range of motion.   IMPRESSION:  1. Medically refractory gastroesophageal reflux disease.  2. History of pancreatic cancer.  3. Hypercholesterolemia.  4. Sarcoidosis.  5. Hypertension.  6. Raynaud's syndrome.   PLAN:  We will proceed with laparoscopic, possible open, hiatal hernia  repair and Nissen fundoplication.  We have gone over the procedure and the  risks fairly extensively in the office prior to this.                                               Adolph Pollack, M.D.    Kari Baars  D:  08/04/2003  T:  08/04/2003  Job:  [id number]

## 2010-11-01 NOTE — Consult Note (Signed)
NAME:  Nicole Montgomery, Nicole Montgomery                ACCOUNT NO.:  [id number]   MEDICAL RECORD NO.:  [id number]          PATIENT TYPE:  INP   LOCATION:  1332                         FACILITY:  [facility]   PHYSICIAN:  Lorre Munroe., M.D.DATE OF BIRTH:  May 25, 1941   DATE OF CONSULTATION:  03/18/2005  DATE OF DISCHARGE:                                   CONSULTATION   CHIEF COMPLAINT:  Abdominal pain.   PRESENT ILLNESS:  Nicole Montgomery is a 70 year old white female who was admitted  to the hospital today because of nausea and severe abdominal pain.  She felt  that this pain was similar in nature to pain which she had before she was  diagnosed with cancer of the pancreas several years ago.  X-rays in the  emergency department suggested small intestinal obstruction.  A CT scan  showed that there were small and dilated small-bowel loops although the  contrast passed to the colon. The patient is not vomiting.  The pain abated  a good deal by the time seen I am seeing her.  There is no fever though she  felt chilly the night before.  The pain has been band-like, upper abdominal  and confined to the abdomen.  The patient's bowel movements were decreased.  She had been able to continue to pass flatus. She has had limited oral  intake since being in the hospital but is now free of pain.   PAST MEDICAL/SURGICAL HISTORY:  In 2002, she underwent a distal  pancreatectomy and splenectomy for a well-differentiated cystic neoplasm of  the tail of pancreas. There has been no evidence  of recurrent cancer. Dr.  Donnie Montgomery follows her.  She had an admission for similar pain in 2004.  Other  operations are a hysterectomy, cholecystectomy, appendectomy, laparoscopic  ventral hernia repair and laparoscopic Nissen fundoplication for reflux.  She additionally has had a kidney stone removal and skin cancers removed.   Medical illnesses:  The  patient no longer has reflux problems. She has  hyperlipidemia, hypertension and is felt to  have sarcoidosis based on lymph  node biopsy in the chest. She suffers from migraine headaches.   MEDICATIONS:  1.  She is on Procardia XL.  2.  Tricor.  3.  Maxzide.  4.  Medicine for migraines.  5.  Baclofen.   ALLERGIES:  She has allergies to Tegretol, Percocet, Neurontin, Dilantin and  Topamax.   SOCIAL HISTORY:  She does not smoke.  She does not drink alcoholic  beverages.   REVIEW OF SYSTEMS:  The patient denies any chronic GI symptoms and no real  change in her bowel habits recently.  No shortness of breath.  No chest  pain.  Generally negative review.   PHYSICAL EXAMINATION:  Temperature and vital signs as recorded by the nurse.  No acute distress.  Very pleasant, joking with me.  HEENT/NECK:  Exam is normal.  No thyroid enlargement.  No neck masses.  CHEST:  Clear to auscultation.  HEART:  Regular rhythm and rate.  No murmur or gallop.  ABDOMEN:  Bowel sounds were normal.  There was no  tinkles or rushes.  Belly  was soft.  There was no hernia.  No mass, no organomegaly noted.  GENITALIA:  Not examined.  EXTREMITIES:  No edema.  Good pulses. No ulcerations.  NEUROLOGICAL:  Grossly normal.   IMPRESSION:  1.  Abdominal pain and distention, probable resolving partial small bowel      obstruction.  2.  History of adenocarcinoma of the pancreas with no current evidence of      disease.  3.  Migraine headaches.  4.  Sarcoidosis.   RECOMMENDATIONS:  1.  Continued expectant management with the limitation of oral intake.  2.  Followup x-rays and exam in the morning.  3.  If recurrent episodes of this occurs, she may eventually require      operative intervention.  4.  She will need to be continued to have monitoring for recurrent cancer as      is being done.      Lorre Munroe., M.D.  Electronically Signed     WB/MEDQ  D:  03/18/2005  T:  03/19/2005  Job:  [id number]   cc:   Nicole Quarry, MD  [address]  [place]  [place] [address]

## 2010-11-01 NOTE — H&P (Signed)
[facility]  Patient:    Nicole Montgomery, Nicole Montgomery                  MRN: [id number] Adm. Date:  [id number] Attending:  Arlis Porta CC:         Viviana Simpler, M.D.  Stacie Acres Cliffton Asters, M.D.   History and Physical  REASON FOR ADMISSION:  Elective distal pancreatectomy, splenectomy, and possible partial colectomy.  HISTORY OF PRESENT ILLNESS:  Nicole Montgomery is a 70 year old female who has a known history of left nephrolithiasis.  She was admitted in late June with left upper quadrant and left midabdominal cramping pain radiating toward her left flank associated with urinary frequency.  At that point, she went to the emergency department and underwent a CT scan which demonstrated a 2 mm left ureteral stone, but a complex cystic mass which appeared to be emanating from tail of the pancreas.  Oral contrast confirmed this, and there was some concern about involvement of the colon and small bowel.  She improved basically pain-wise and was discharged from the hospital and set up for elective resection as above.  She denies any weight loss or change in appetite or change in her bowel habits.  PAST MEDICAL HISTORY: 1. Nephrolithiasis. 2. Sarcoidosis. 3. Raynauds syndrome. 4. Peptic ulcer disease. 5. Gastroesophageal reflux disease. 6. Migraine headaches. 7. Temporal arteritis. 8. Colonic polyps.  PAST SURGICAL HISTORY:  Hysterectomy; cholecystectomy and appendectomy; excision of a nonmelanoma skin cancer from shoulder and nose; left cervical lymph node biopsy.  ALLERGIES:  None reported.  CURRENT MEDICATIONS: 1. Prevacid 30 mg q.d. 2. Levsin 0.125 mg p.r.n. 3. Nifedipine ER 30 mg q.d. 4. Esgic-Plus p.r.n. migraines. 5. Roxicet. 6. Evista 60 mg q.d.  SOCIAL HISTORY:  She is married and denies tobacco or alcohol use.  FAMILY HISTORY:  No pancreatic neoplasms or endocrine tumors noted.  REVIEW OF SYSTEMS:  CARDIOVASCULAR:  No known heart  disease or hypertension. PULMONARY:  No asthma, pneumonia, or COPD.  GI:  No hepatitis, diverticulitis. HEMATOLOGIC:  No bleeding disorders or deep venous thrombosis.  She did have a transfusion many years ago.  NEUROLOGIC:  No strokes or seizures.  ENDOCRINE: No diabetes.  PHYSICAL EXAMINATION:  GENERAL:  Well-developed, well-nourished female in no acute distress, very pleasant, and cooperative.  VITAL SIGNS:  Temperature is 98.2, blood pressure 128/78, pulse 92.  She is 5 feet 2 inches tall and weighs approximately 141 pounds.  HEENT:  Eyes:  Extraocular movements intact, and sclerae are not icteric.  SKIN:  Warm and dry without jaundice.  NODES:  No palpable cervical or supraclavicular adenopathy.  CARDIOVASCULAR:  Heart demonstrates regular rate and rhythm without murmur.  RESPIRATORY:  Breath sounds equal and clear.  Respirations nonlabored.  ABDOMEN:  Soft with a right upper quadrant transverse scar.  There is palpable fullness in the left upper quadrant area which extends to the left midabdomen. No peritoneal signs noted.  No hernias.  Active bowel sounds noted.  MUSCULOSKELETAL:  Full range of motion with no cyanosis or edema.  IMPRESSION:  Complex cystic neoplasm of the tail of the pancreas which appears to be involving the spleen and possible the transverse colon.  PLAN:  Exploratory laparotomy, distal pancreatectomy, splenectomy, and possible partial colectomy.  I have explained the indication, options, rationale, and risks including, but not limited to, bleeding, infection, accidental damage to intra-abdominal organs including kidney, ureter, or intestine, accidental injury to the stomach, pancreatic leak and pancreatic fistula, possibility of having  endocrine and ectocrine pancreatic insufficiency, and the risk of anesthesia.  She and her husband seem to understand and agree to proceed. DD:  12/28/00 TD:  12/28/00 Job: [id number]

## 2010-11-01 NOTE — Discharge Summary (Signed)
[facility]  Patient:    Nicole Montgomery, Nicole Montgomery                  MRN: [id number] Adm. Date:  [id number] Disc. Date: 12/12/00 Attending:  Arlis Porta CC:         Stacie Acres. Cliffton Asters, M.D.  Adolph Pollack, M.D.   Discharge Summary  DATE OF BIRTH:  28-Dec-1940  DISCHARGE DIAGNOSES:  1. Complex pancreatic mass.  2. History of sarcoidosis greater than 20 years (diagnosis by liver, lymph     node biopsy).  3. History of Raynauds syndrome of greater than 20 years.  4. History of peptic ulcer disease (remote) diagnosed by upper GI.  5. History of gastroesophageal reflux disease.  6. History of cholecystectomy, appendectomy, vaginal hysterectomy.  7. History of migraines.  8. History of nephrolithiasis with basket extraction.  9. History of temporal arteritis. 10. History of colon polyps.  DISCHARGE MEDICATIONS:  1. Evista 60 mg p.o. q.d.  2. Nifedipine 30 mg p.o. q.d. (for headaches).  3. Hyoscyamine p.r.n.  4. Prevacid 30 mg p.o. q.d.  5. Percocet one to two p.o. q.4-6h. p.r.n. abdominal pain.  6. Esgic-Plus p.r.n. headache.  DISCHARGE FOLLOW-UP:  Patient will be contacted by Dr. Abbey Chatters the Monday following discharge to schedule outpatient follow-up and plan of the surgery. She is to follow up with Dr. Charlott Rakes for Pneumovax if we are unable to provide this for her on the day of discharge.  HISTORY OF PRESENT ILLNESS:  Nicole Montgomery is a 70 year old woman who was in her usual state of health until the night prior to admission when she developed the acute onset of left-sided/mid abdominal pain of a cramping nature associated with nausea but no vomiting.  She initially felt that the pain was consistent with prior kidney stones, but, when it intensified, she sought help in the emergency department.  She had had no significant weight loss and had maintained a good appetite.  No history of pancreatitis.  She had had a recent history of  episodic low back pain which she presumed was secondary to fall and which had improved somewhat.  She recalled no unusual symptoms.  Evaluation in the emergency department initially settled on possibility of ureteral stone.  Ultrasound questioned the presence of a 2 mm stone, which was followed up by an abdominopelvic CT.  This excluded ureteral stone as a diagnosis (an adjacent calcified vessel was noted), but did unfortunately document the presence of a large complex pancreatic mass with inflammatory change and a small amount of free fluid.  Amylase and lipase were normal, platelets were low at 119,000, white blood cells 6300, hemoglobin 13.3.  HOSPITAL COURSE:  Drs. Gerkin and Rosenbower provided surgical consultation. Pain was controlled with Percocet.  Given the high likelihood of carcinoma, plan was for discharge from the hospital to reschedule en bloc surgical excision of the cyst, tail of the pancreas, and spleen with or without hemicolectomy.  Prior to discharge, she was administered Neisseria and Haemophilus vaccinations, and Pneumococcus has been ordered (attempting to get this from [facility] pharmacy, as [facility] supplies are gone).  If this is not possible, she will obtain her Pneumovax in Dr. Foye Spurling office at the beginning of the week.  Thrombocytopenia (platelets dropping to 87), and leukopenia (2.5) were felt secondary to splenic sequestration, as Dr. Abbey Chatters noted that the spleen was enlarged on CT.  Question of portal hypertension, as no other explanation is obvious at this time.  Nicole Montgomery was discharged in stable and improved condition, her pain being well controlled on Percocet.  Surgery is planned for two to three weeks. DD:  01/06/01 TD:  01/06/01 Job: [id number]

## 2010-11-01 NOTE — H&P (Signed)
NAME:  Nicole Montgomery, Nicole Montgomery                          ACCOUNT NO.:  [id number]   MEDICAL RECORD NO.:  [id number]                   PATIENT TYPE:  INP   LOCATION:  0251                                 FACILITY:  [facility]   PHYSICIAN:  Merilynn Finland, M.D.                DATE OF BIRTH:  [birth year]   DATE OF ADMISSION:  08/17/2002  DATE OF DISCHARGE:                                HISTORY & PHYSICAL   CHIEF COMPLAINT:  Sudden onset right upper quadrant/right rib cage pain.   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  She underwent exploratory laparotomy, distal  splenectomy, and pancreatectomy, partial colectomy December 28, 2000.  She was  followed by Merilynn Finland, M.D.  She has not received any chemo or  radiation therapy.  Recent CA 19-9 was normal and per Merilynn Finland, M.D.  CTs done last month were stable.  The patient also has a history of  sarcoidosis with diagnosis established by mediastinoscopy March 30, 2002.   The patient presented to the [facility] Emergency Room on August 16, 2002  with report of sudden onset of right upper quadrant/right rib cage pain  earlier that morning which she rated at a level of 10/10.  She states that  she became clammy and sweaty.  Her pain eased to 5/10, but again worsened  during the evening.  She reports that the pain increased with movement and  deep inspiration.  She reports that she also experienced an episode of mid  abdominal pain which radiated laterally and up through her shoulders on the  day of admission similar to pain she was having at the time of her diagnosis  of pancreatic cancer in July 2002.  She denies any fever, shortness of  breath, cough, chest pain, lower extremity edema or pain.  She reports she  has had a cholecystectomy.  Her bowels have been moving normally.  She  denies any urinary symptoms.  Her  weight has been stable and oral intake has  been good.   PAST MEDICAL HISTORY:  1. Pancreatic cancer as above.  2. Sarcoidosis.  3. GERD.  4. PUD.  5. Raynaud's.  6. Tinnitus.  7. History of skin cancer (basal cell and squamous cell).  8. History of nephrolithiasis.  9. History of vertigo.  10.      Status post hysterectomy.  11.      Migraines.  12.      History of herpes zoster.  13.      Status post cholecystectomy.  14.      Status post appendectomy.  15.      Status post central hernia repair August 2003.   CURRENT MEDICATIONS:  1. Procardia XL 60 mg daily.  2. Fosamax 70 mg weekly.  3. Prevacid  30 mg daily.  4. Lovastatin 40 mg daily.  5. Esgic Plus p.r.n.  6. Maxzide/hydrochlorothiazide 37.5/25 daily.   ALLERGIES:  TEGRETOL, PERCOCET.   FAMILY HISTORY:  Noncontributory.   SOCIAL HISTORY:  The patient lives in [place].  She is married.  She has  two children.   REVIEW OF SYSTEMS:  See HPI.   PHYSICAL EXAMINATION:  VITAL SIGNS:  Temperature 98.2, heart rate 83,  respirations 24, blood pressure 137/87, oxygen saturation 98% on room air.  GENERAL:  Pleasant Caucasian female in no acute distress.  HEENT:  Normocephalic, atraumatic.  Pupils are equal, round, and reactive to  light.  Extraocular movements are intact.  Sclerae anicteric.  Oropharynx is  clear.  CHEST:  Lungs are clear bilaterally.  CARDIOVASCULAR:  Regular rate and rhythm.  ABDOMEN:  Soft, tender over the right upper quadrant/right lower rib cage.  EXTREMITIES:  No clubbing, cyanosis, edema.  Calves are soft and nontender.  NEUROLOGIC:  Alert and oriented.  Motor strength is 5/5.   LABORATORY DATA:  Hemoglobin 13.7, white count 9.5, platelets 431,000.  Sodium 139, potassium 3.5, BUN 21, creatinine 1.1, glucose 117, total  bilirubin 0.3, alkaline phosphatase 58, SGOT 27, SGPT 19, total protein 7.1,  albumin 3.9, calcium 10.1.  Lipase 25, amylase 66.   Radiology:  CT of the abdomen and pelvis:   Stable periportal adenopathy,  post surgical changes.   IMPRESSION AND PLAN:  The patient is a 70 year old woman with a history of  pancreatic cancer status post resection July 2002 with recent normal CA 19-9  and stable CTs who presents with sudden onset of right upper quadrant/right  rib cage pain.  We will admit her for pain control and further evaluation.  1. Right upper quadrant/right rib cage pain, sudden onset.  Abdominal/pelvic     CTs are stable.  Will check a spiral CT of the chest to rule out     pulmonary embolism.  We will also begin Lovenox at a prophylactic dose.     Will begin morphine for pain control.  We will consider abdominal MRI     pending the above work-up.  2. Pancreatic cancer.  No evidence of recurrence.  3. History of sarcoidosis.  4. Gastroesophageal reflux disease.  5. History of peptic ulcer disease.   The patient seen and examined by Merilynn Finland, M.D.  Chart reviewed.     Lonna Cobb, N.P.                         Merilynn Finland, M.D.    LT/MEDQ  D:  08/18/2002  T:  08/18/2002  Job:  [id number]

## 2010-11-01 NOTE — Discharge Summary (Signed)
NAME:  Nicole, Montgomery NO.:  [id number]   MEDICAL RECORD NO.:  [id number]          PATIENT TYPE:  INP   LOCATION:  1332                         FACILITY:  [facility]   PHYSICIAN:  Sherin Quarry, MD      DATE OF BIRTH:  1941-05-07   DATE OF ADMISSION:  03/17/2005  DATE OF DISCHARGE:  03/19/2005                                 DISCHARGE SUMMARY   HOSPITAL COURSE:  Nicole Montgomery is a 70 year old lady who initially presented  to [facility] on March 17, 2005, with increased epigastric pain  which seemed to radiate to the shoulder, associated with nausea. There was  no associated vomiting. Since the problem began, i.e. Oct 04, 2024the patient  had passed flatus on one occasion but had no bowel function. She had  presented to the [facility] emergency room where a CT scan of the abdomen  was done and was consistent with an ileus pattern. Omental scarring was  identified of uncertain significance. Her past history was remarkable for an  adenocarcinoma of the pancreas which had been resected in July 2002. On  physical exam, HEENT exam was within normal limits. The chest was clear.  Examination of back revealed no CVA or point tenderness. Cardiovascular exam  revealed normal S1 and S2. There were no rubs, murmurs or gallops. On  abdominal exam there were vigorous bowel sounds which did not seem to be  high-pitched. The abdomen was benign. There was no guarding or rebound.  There was mild tenderness in the midepigastric area. In light of this  presentation, the patient was admitted for conservative therapy. On  admission she was placed on IV of normal saline with 10 mEq of KCl at 125  mL/hour, Protonix 40 mg IV every 12 hours was given. She was given p.r.n.  Zofran and morphine. Procardia 30 mg daily was continued. Dr. Lebron Conners  saw the patient in consultation for the surgical service and agreed with  conservative management, recommended a follow-up x-ray the next  day. By  October 4 the patient felt a lot better. The abdominal x-ray showed  decreased small bowel distension. The patient was seen by Dr. Abbey Chatters who  is very familiar with her case and has followed her over a long period of  time. He reviewed the CAT scan and noted the ill-defined omental thickening.  He agreed that this was of uncertain significance. He noted that the patient  was scheduled for a follow-up CT scan on October 25 and an appointment with  Dr. Donnie Coffin at that time. He felt that the patient should go ahead and have  this follow-up CT scan. Therefore, on October 4 the patient was discharged.  At the time of her discharge she was advised to continue her usual  medicines, i.e. Procardia, TriCor, Maxzide, baclofen, and Esgic-Plus. She  was also given a prescription for Phenergan 25 mg with instructions to use  this medication p.r.n. for nausea. She was given Protonix 40 mg daily with  instruction to take one tablet daily for 2 weeks. She was advised to keep  her appointment  with Dr. Donnie Coffin as described and also to follow up with Dr.  Abbey Chatters on a regular basis.   DISCHARGE DIAGNOSES:  1.  Acute abdominal pain and ileus, resolved.  2.  Hypokalemia, resolved.  3.  History of adenocarcinoma of the pancreas status post splenectomy,      pancreatectomy, and partial colectomy.  4.  History of gastroesophageal reflux.  5.  Status post hysterectomy, cholecystectomy and appendectomy.  6.  History of ventral hernia repair.  7.  Multiple drug allergies.  8.  History of hypertension.  9.  History of sarcoidosis.  10. History of nephrolithiasis.  11. Recurrent migraine headaches.   CONDITION AT THE TIME OF DISCHARGE:  Good.           ______________________________  Sherin Quarry, MD     SY/MEDQ  D:  03/24/2005  T:  03/25/2005  Job:  [id number]   cc:   Adolph Pollack, M.D.  [address], [address]  [place]  [place] [address]   Pierce Crane, M.D.  Fax: [phone]    Stacie Acres. Cliffton Asters, M.D.  Fax: [phone]

## 2010-11-01 NOTE — Op Note (Signed)
NAME:  Nicole Montgomery, Nicole Montgomery                          ACCOUNT NO.:  [id number]   MEDICAL RECORD NO.:  [id number]                   PATIENT TYPE:  AMB   LOCATION:  ENDO                                 FACILITY:  [facility]   PHYSICIAN:  Vania Rea. Jarold Motto, M.D. [facility]        DATE OF BIRTH:  [birth year]   DATE OF PROCEDURE:  05/09/2003  DATE OF DISCHARGE:  05/09/2003                                 OPERATIVE REPORT   PROCEDURE:  Esophageal manometry.   RESULTS:  1. Upper  esophageal sphincter appeared  to be normal coordination between     pharyngeal contraction and cricopharyngeal relaxation.  2. Lower esophageal sphincter. The lower esophageal sphincter is borderline     incompetent with a mean pressure of 15 mmHg. There appears to be normal     relaxation with swallowing.  3. Motility pattern. There are normally propagated peristaltic waves of     normal amplitude and duration throughout the length of the esophagus,     both wet and dry swallows. Mean amplitude of contractions are 100 mmHg.   ASSESSMENT:  This is a normal manometry except borderline lower esophageal  and sphincter incontinence which would correlate with her acid reflux  problems.                                               Vania Rea. Jarold Motto, M.D. [facility]    DRP/MEDQ  D:  05/16/2003  T:  05/16/2003  Job:  [id number]

## 2010-11-01 NOTE — Discharge Summary (Signed)
   NAME:  Nicole Montgomery, Nicole Montgomery                          ACCOUNT NO.:  [id number]   MEDICAL RECORD NO.:  [id number]                   PATIENT TYPE:  INP   LOCATION:  0251                                 FACILITY:  [facility]   PHYSICIAN:  Merilynn Finland, M.D.                DATE OF BIRTH:  10-Sep-1940   DATE OF ADMISSION:  08/16/2002  DATE OF DISCHARGE:  08/19/2002                                 DISCHARGE SUMMARY   Nicole Montgomery is a 70 year old female with a history of pseudomucinous cyst,  adenocarcinoma of the pancreas 7/02, who was status post exploratory  laparotomy, a distal splenectomy and pancreatectomy, partial colectomy  12/28/00.  She has been followed with active surveillance with scans.  She  has required no adjuvant therapy.  She also has a history of sarcoidosis  with mediastinoscopy biopsy proven disease in the chest 03/30/02 with a  lymph node possibly involved in the periaortic region in the abdomen as  well.   HOSPITAL COURSE:  She was admitted for sudden onset of right upper quadrant  pain that radiated around to her back.  She was placed on comfort measures  and scans were performed.  They essentially showed no change and no  explanation for her pain.  We consulted pulmonary medicine regarding her  sarcoid.  Recommendations were made.  Pain was controlled within the next  two days.  She was discharged home on 08/19/02 in stable condition.  She will  follow up in the pulmonary clinic as well as with Dr. Katrinka Blazing two to three  weeks after discharge.   DISCHARGE MEDICATIONS:  Procardia 30 mg daily, Prevacid 30 mg daily,  Lovastatin, Maxzide.                                                Merilynn Finland, M.D.    JSS/MEDQ  D:  09/08/2002  T:  09/08/2002  Job:  [id number]

## 2010-11-01 NOTE — Consult Note (Signed)
[facility]  Patient:    ARIHANNA, ESTABROOK                  MRN: [id number] Attending:  Marnee Guarneri, M.D. Dictator:   Rosemarie Ax, N.P. CC:         Adolph Pollack, M.D.  Miguel Aschoff, M.D.  Stacie Acres Cliffton Asters, M.D.  [facility]   Consultation Report  DATE OF BIRTH:  Feb 20, 1941  ATTENDING/REQUESTING PHYSICIAN:  Dr. Adolph Pollack.  REASON FOR CONSULTATION:  Adenocarcinoma of the pancreas.  HISTORY OF PRESENT ILLNESS:  Mrs. Weldon is a pleasant 70 year old female who experienced left upper quadrant and left midabdominal pain that radiated horizontally across her abdomen toward her right side.  She was seen in the ER and had a known history of nephrolithiasis.  She underwent a CT on December 10, 2000 that showed a mildly complex cystic mass on the distal aspect of the pancreas measuring 9.5 x 8.7 x 12.0 cm.  Dr. Avel Peace was consulted for surgery and elective surgical date was set for December 28, 2000.  Her pain improved and she was discharged home.  Exploratory laparotomy on December 28, 2000 was performed for a distal splenectomy, pancreatectomy and partial colectomy.  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  Colonic segment with fibrous adhesions.  Spleen and accessory spleen showed no pathologic abnormalities. There were no lymph nodes identified.  PAST MEDICAL HISTORY:  1. Sarcoidosis, 1970.  2. Nephrolithiasis.  3. Raynauds syndrome in 1970.  4. PUD, 1970.  5. Colonic polyps in 1991.  6. Osteoarthritis, left wrist.  7. Temporal arteritis, 1998.  8. Gastroesophageal reflux disease.  9. Migraine headaches. 10. Irritable bowel syndrome in the 1970s. 11. Status post herpes zoster, left facial nerve.  PAST SURGICAL HISTORY:  1. Polypectomy.  2. Hysterectomy.  3. Cholecystectomy.  4. Appendectomy.  5.  Non-melanoma skin cancer of the shoulder and nose.  6. Left cervical lymph node biopsy in 1970 positive for sarcoidosis.  7. Cystoscopic removal of kidney stone.  ALLERGIES:  There are no known drug allergies.  MEDICATIONS:  1. Prevacid 30 mg q.d.  2. Levsin 0.125 mg p.r.n.  3. Nifedipine ER 30 mg one p.o. q.d.  4. Roxicet p.r.n. pain.  5. Evista 60 mg q.d.  6. Esgic-Plus p.r.n. migraines.  SOCIAL HISTORY:  Mrs. Tarrant is married to Olegario Shearer for the past 10 years. She has one son and one daughter by a previous marriage.  They live in [place].  Both of her children live in [place], [place].  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  FAMILY HISTORY:  Mother is alive at 48 and healthy.  Father died at age 10 with emphysema.  She has six sisters.  One has had a CVA.  She has two brothers.  One brother has had a CVA.  Maternal grandfather died of cancer, type unknown.  Paternal aunt died with breast cancer.  REVIEW OF SYSTEMS:  She denies any shortness of breath or chest pain.  She has had no chest pressure.  She denies headaches.  She did have temporary nausea prior to surgery.  Her bowels have been regular.  She has had no extreme weight loss or gain.  She denies any urinary symptoms such as frequency, urgency or dysuria.  She has not had any edema of her extremities.  She has had decreased sensation in her left cheek and decreased hearing in her right ear since having herpes zoster.  PHYSICAL EXAMINATION:  GENERAL:  This is a 70 year old white female, status post pancreatectomy, distal splenectomy, mobilization of the splenic flexure and partial colectomy.  HEENT:  Normocephalic.  PERRLA.  EOMs intact.  Decreased sensation, left cheek.  Decreased hearing, right ear, per whisper test.  NODES:  There are no cervical, axillary or inguinal nodes appreciated.  CHEST:  Clear to  auscultation.  CARDIOVASCULAR:  Regular rate and rhythm.  No murmur.  No gallop.  ABDOMEN:  There is slight distention, a well-healing transverse surgical wound with staples intact, positive for tenderness along surgical line.  There is no hepatomegaly appreciated.  EXTREMITIES:  Without cyanosis, clubbing or edema.  NEUROLOGIC:  Cranial nerves II-XII grossly intact, alert and oriented.  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  The patient was seen and examined by Dr. Aliene Altes, who reviewed the pathology and diagnosis with the patient that this is certainly a carcinoma based on the atypia of the cells.  It had very few other features to suggest increased aggression or a highly aggressive tumor.  There was no stromal invasion.  Dr. Lorin Picket reviewed the data on adjunctive treatment with her and in light of no definite survival advantage nor any increase in the long-term decreased recurrence, he does not recommend adjunctive chemotherapy or radiation in this setting.  Dr. Lorin Picket would like to see her at approximately three weeks after discharge home to continue followup with her closely.  Thank you very much for this consultation. DD:  01/01/01 TD:  01/01/01 Job: [id number]

## 2010-11-01 NOTE — Consult Note (Signed)
[facility]  Patient:    Nicole Montgomery, Nicole Montgomery                       MRN: [id number] Proc. Date: 12/10/00 Adm. Date:  [id number] Attending:  Miguel Aschoff CC:         Miguel Aschoff, M.D.  Stacie Acres Cliffton Asters, M.D.   Consultation Report  REASON FOR CONSULTATION:  Complex pancreatic mass.  HISTORY OF PRESENT ILLNESS:  Ms. Jasmin is a 70 year old female with a known history of nephrolithiasis previously.  She began having the same type of pain she has that is left upper quadrant and left mid-abdominal cramping pain radiating back toward her left flank and associated with urinary frequency. The pain persisted and led her to come to the emergency department.  It basically settled down in her lower pelvis.  She denies any weight loss or appetite change or change in her bowel habits.  In the emergency department, she underwent a CT scan, which demonstrated a 2 mm left ureteral stone but also what appeared to be a complex cystic mass at the tail of the pancreas. Further imaging was done with IV contrast, which demonstrated septations at the cystic mass and also appeared to be soft tissue enhancement.  It is for this reason that I was asked to see her.  She has not had fevers or chills. She has not had a previous history of pancreatitis.  PAST MEDICAL HISTORY: 1. Nephrolithiasis. 2. Sarcoidosis. 3. Raynauds syndrome. 4. Peptic ulcer disease. 5. Gastroesophageal reflux disease. 6. Migraine headaches. 7. Temporal arteritis. 8. Colonic polyps.  PAST SURGICAL HISTORY: 1. Hysterectomy. 2. Cholecystectomy and appendectomy. 3. Excision of nonmelanoma skin cancer from shoulder and nose. 4. Left cervical lymph node biopsy to verify diagnosis of sarcoidosis.  ALLERGIES:  None.  HOME MEDICATIONS: 1. Evista. 2. Nifedipine. 3. ______. 4. Prevacid. 5. Hydrocodone. 6. Hyoscyamine.  SOCIAL HISTORY:  She denies use of tobacco or alcohol heavily.  FAMILY  HISTORY:  No pancreatic neoplasms or endocrine tumors noted.  REVIEW OF SYSTEMS:  CARDIOVASCULAR:  No known heart disease or hypertension. PULMONARY:  No asthma, pneumonia, or COPD.  GI:  No hepatitis or diverticulitis.  HEMATOLOGIC:  No bleeding disorders or deep venous thrombosis.  She had a transfusion with childbirth many years ago. NEUROLOGIC:  No strokes or seizures.  PHYSICAL EXAMINATION:  GENERAL:  An uncomfortable-appearing female but who is very pleasant and cooperative.  She is afebrile.  SKIN:  Warm and dry without jaundice.  HEENT:  Eyes:  Extraocular motions intact.  Sclerae clear.  LYMPH NODES:  No palpable cervical or supraclavicular adenopathy.  CARDIOVASCULAR:  Heart demonstrates regular rate and rhythm without murmur.  RESPIRATORY:  Breath sounds equal and clear.  Respirations unlabored.  ABDOMEN:  Soft.  There is a right upper quadrant transverse scar.  There is a tender fullness in the left upper quadrant and left mid abdomen.  No peritoneal signs.  Active bowel sounds are noted.  MUSCULOSKELETAL:  Her extremities full range of motion.  There is no cyanosis or edema noted.  LABORATORY DATA:  CBC is abnormal for a thrombocytopenia with a platelet count of 119,000.  Her liver function tests are normal.  She has an elevated blood glucose of 150.  Urinalysis negative.  Amylase and lipase pending.  CT scan demonstrates a complex, large, cystic mass that appears to be originating from the tail of the pancreas with some soft tissue foci in the mass, as  well as septations.  In the inferior portion of the mass, there is a small amount of free fluid and inflammatory change.  There is a 1.5 cm enlarged pericaval lymph node.  Splenomegaly is also present.  There is a 2 mm left ureteral calculus.  IMPRESSION: 1. Complex cystic mass at the tail of the pancreas with splenomegaly.  It also    has a slightly enlarged porta caval node.  Differential diagnosis includes     cyst adenoma versus pseudocyst versus cyst adenocarcinoma.  I feel    pseudocyst is less likely, given the fact that she has not had pancreatitis    in the past. 2. Left ureterolithiasis.  Her pain seems to be characteristic of this but it    is hard to tell whether she is having pain truly from this alone or whether    she is having pain from the pseudocyst and a small inflammatory change from    it. 3. Splenomegaly with thrombocytopenia.  RECOMMENDATIONS: 1. We agree with pain control and clear-liquid diet. 2. We will check a CA 19-9 level. 3. We would consult hematology regarding the thrombocytopenia and    splenomegaly. 4. Because of the complex nature of the cystic lesion, I think eventually she    will need a distal pancreatectomy and splenectomy to clarify the diagnosis.    I have gone over this with her and her husband.  I did explain the    procedure and its risks including but not limited to bleeding, infection,    pancreatic leak leading to a fistula, accidental injury to intra-abdominal    organs, and the risk of general anesthesia.  I also told her that this was    not a case to be done urgently but rather to be done electively.  We will observe her in the hospital and I will be absent tomorrow but will have Dr. Gerrit Friends see her in my absence. DD:  12/10/00 TD:  12/10/00 Job: [id number]

## 2010-11-01 NOTE — Discharge Summary (Signed)
[facility]  Patient:    Nicole Montgomery, Nicole Montgomery                  MRN: [id number] Adm. Date:  [id number] Disc. Date: [id number] Attending:  Arlis Porta CC:         Viviana Simpler, M.D.  Stacie Acres Cliffton Asters, M.D.   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  SECONDARY DIAGNOSES: 1. Sarcoidosis. 2. Raynaud syndrome. 3. Osteoarthritis. 4. Gastroesophageal reflux disease. 5. Migraine headaches. 6. Nephrolithiasis.  PROCEDURE:  Distal pancreatectomy with splenectomy, partial transverse colectomy on December 28, 2000.  REASON FOR ADMISSION:  Nicole Montgomery is a 70 year old female with a history of nephrolithiasis who presented to the emergency department prior to this admission complaining of left flank and abdominal pain.  A CT scan was performed and it demonstrated a lesion in the tail of the pancreas as well as an enlarged spleen.  A CA 99 and other workup was performed.  No kidney stone that was significant enough to cause the pain was noted.  She now is administered for a above elective procedure.  HOSPITAL COURSE:  Postoperatively, she received a two-unit blood transfusion and was mildly hyponatremic.  Her thrombocytopenia improved overall.  Her anemia remained stable.  A consultation was obtained with Dr. Aliene Altes of hematology oncology and he did not recommend adjuvant chemotherapy as the pathology came back mucinous cystadenocarcinoma of the pancreas.  She had a slow, progressive hospital course.  The Jackson-Pratt drainage declined and was clear.  She subsequently was started on a diet and was tolerating this, had bowel function, and was able to be discharged on January 06, 2001.  DISPOSITION:  Discharged to home in satisfactory condition on January 06, 2001. She was given discharge instructions and activity limitations.  Her staples were removed prior to discharge.  She  was going to call the office and arrange to see me in follow-up.  She was given an oral analgesic (Vicodin) for pain and told to continue her other medications. DD:  01/14/01 TD:  01/14/01 Job: [id number]

## 2010-11-01 NOTE — Op Note (Signed)
TNAMEALLISEN, PIDGEON                    ACCOUNT NO.:  [id number]   MEDICAL RECORD NO.:  [id number]                   PATIENT TYPE:  INP   LOCATION:  0377                                 FACILITY[facility]  [facility]   PHYSICIAN:  Adolph Pollack, M.D.            DATE OF BIRTH:  [birth year]   DATE OF PROCEDURE:  01/27/2002  DATE OF DISCHARGE:                                 OPERATIVE REPORT   PREOPERATIVE DIAGNOSIS:  Ventral incisional hernia.   POSTOPERATIVE DIAGNOSIS:  Ventral incisional hernia.   PROCEDURE:  Laparoscopic repair of ventral incisional hernia with Gore-Tex  dual mesh.   SURGEON:  Adolph Pollack, M.D.   ANESTHESIA:  General.   INDICATION:  The patient is a 70 year old female, who underwent distal  pancreatectomy for cyst adenocarcinoma of the pancreas.  She also had a  splenectomy and partial transverse colectomy.  She was doing well, then  began noticing some bulging and pain in her incision and presented to the  office with an obvious incisional hernia.  She is more and more symptomatic  from it.  She now presents for elective repair.   TECHNIQUE:  She was brought to the operating room, placed supine on the  operating table, and a general anesthetic was administered.  The abdomen was  sterilely prepped and draped.  A subumbilical incision is made, incising the  skin and subcutaneous tissue sharply.  Next, a small incision was made in  the midline fascia and in the peritoneum, and the peritoneal cavity was  entered under direct vision.  A pursestring suture of 0 Vicryl was placed  around the fascial edges, and a Hasson trocar was introduced into the  peritoneal cavity.  A pneumoperitoneum was created by insufflation of CO2  gas.   Next, a 30 degree laparoscope was introduced, and the hernia was identified  with some omentum up in it.  There were also omental adhesions around the  hernia.  Under direct vision two 10 mm trocars were placed in the abdominal  cavity, one in the right lower quadrant and one in the left lower quadrant.  Using sharp dissection and no cautery, the thin adhesions were taken down  around the hernia repair, and the omentum was reduced from the hernia  repair.  I then used harmonic scalpel to reduce more of the omentum that was  stuck to the abdominal wall, making sure no bowel was near it.  Once I  cleared all the adhesions off the abdominal wall, I placed four spinal  needles around the four quadrants of the hernia.  The hernia measured  approximately 9 x 6 cm in size.  Next, I marked 3-4 cm out from these spinal  needles and drew a large oval.  I removed the spinal needles and a 15 x 19  cm piece of Gore-Tex dual mesh was brought onto the field and four anchoring  of 0 Novofil placed in four quadrants of the  mesh.  The mesh was then placed  in the abdominal cavity and unfurled with the rough side facing up and the  smooth side facing down.  Four incisions are made at the four quadrants  externally, and the anchoring sutures are pulled up through these and tied  down to the fascia, anchoring the mesh at the anterior abdominal wall.  I  then further anchored the mesh at the anterior abdominal wall with spiral  tacks.  Following this, four more incisions were made in different quadrant  areas, and I used further anchoring sutures passing them through with the  suture passer to anchor with the mesh.  This allowed for more than adequate  coverage of the defect with 4 cm overlapped in all directions.   Next, I inspected the repair area.  I once again inspected the stomach and  the bowel and noted no injury.  There was no bleeding.  I removed the Hasson  trocar under direct vision, closed the fascial defect by tightening up and  tying down the pursestring suture and also using an interrupted 0 Vicryl  suture.  I then released the pneumoperitoneum and then removed the remaining  trocars.   All skin incisions were then  closed with 4-0 Monocryl subcuticular stitches.  Steri-Strips and sterile dressings were applied.   She tolerated the procedure well without any apparent complications, and she  was taken to the recovery room in satisfactory condition.                                               Adolph Pollack, M.D.    Kari Baars  D:  01/27/2002  T:  01/29/2002  Job:  [id number]   cc:   Stacie Acres. Cliffton Asters, M.D.   Merilynn Finland, M.D.  [address] [facility]  [place]  [place] [address]  Fax: [phone]

## 2010-11-01 NOTE — H&P (Signed)
NAME:  Nicole, Montgomery                ACCOUNT NO.:  [id number]   MEDICAL RECORD NO.:  [id number]          PATIENT TYPE:  INP   LOCATION:  0101                         FACILITY:  [facility]   PHYSICIAN:  Sherin Quarry, MD      DATE OF BIRTH:  05/01/1941   DATE OF ADMISSION:  03/17/2005  DATE OF DISCHARGE:                                HISTORY & PHYSICAL   HISTORY OF PRESENT ILLNESS:  Nicole Montgomery is a 70 year old lady who  indicates that yesterday in the evening she noted the acute onset of  increased gas as well as mid epigastric pain which seemed to radiate to her  shoulder. This was associated with nausea. There was no associated vomiting.  Since that time she has passed gas on several occasions but has had no  further bowel function. Her last bowel movement was yesterday afternoon and  was described as normal. She says that last night she had some chills  associated with the pain. There has been no associated shortness of breath,  cough, chest discomfort, dysuria, or hematuria. When the symptoms failed to  resolve, she presented to the [facility] emergency room where she was noted  to have a blood pressure of 137/91, temperature 97.3, pulse 90, respirations  20, O2 saturation was 99%. She was sent for a CT scan of the abdomen which  was interpreted as showing an ileus pattern rather than obstruction.  Contrast seemed to go through the colon. Scarring in the omental area was  noted which seemed to be a new finding from April 2006; the significance of  this was uncertain. The patient received morphine at about 0200. At this  point she says that her pain has somewhat improved. She will be admitted at  this time for further evaluation and treatment.   Her past medical history is very significant in that the patient underwent  an extensive operative procedure in July 2002. At that time she had a distal  splenectomy and pancreatectomy as well as partial colectomy for  adenocarcinoma of the  pancreas. She has been followed since that time with  serial scans and has been thought to be doing well. She has not received any  adjuvant chemotherapy. I note that in March 2004 she was admitted with  abdominal pain and nausea and at that time her symptoms resolved  spontaneously. Her last CAT scan prior to this one was done in April 2006.   PAST MEDICAL HISTORY:  Allergies:  The patient states that she is allergic  to:  1.  TEGRETOL.  2.  PERCOCET.  3.  NEURONTIN.  4.  DILANTIN.  5.  TOPAMAX.   Current medications are:  1.  Procardia XL 60 mg daily.  2.  Tricor 160 mg daily.  3.  Maxzide 37.5/25 one daily.  4.  She also takes Esgic-Plus p.r.n. for migraine headaches.  5.  Baclofen 10 mg t.i.d.   Operations:  She has had previous excision of multiple skin cancer. She has  had the pancreatectomy as described above. She has also had a hysterectomy,  cholecystectomy, appendectomy,  left cervical lymph node biopsy, a  laparoscopic ventral hernia repair, laparoscopic fundoplication for  esophageal reflux, and a kidney stone basketing procedure.   Medical illnesses are:  1.  Pancreatic cancer as described above.  2.  Gastroesophageal reflux status post fundoplication.  3.  Hyperlipidemia.  4.  Hypertension.  5.  Sarcoidosis.  6.  History of kidney stones.  7.  Migraine headaches.  8.  Squamous cell cancer of the skin.   FAMILY HISTORY:  Noncontributory.   SOCIAL HISTORY:  The patient is married. She does not smoke. She does not  abuse alcohol or drugs.   REVIEW OF SYSTEMS:  HEAD:  She denies headache or dizziness. EYES:  She  denies visual blurring or diplopia. EAR, NOSE AND THROAT:  Denies earache,  sinus pain or sore throat. CHEST:  Denies coughing, wheezing or chest  congestion. There is no shortness of breath. CARDIOVASCULAR:  Denies  orthopnea, PND or ankle edema. GI: See above. GU: See above. NEURO:  No  history of seizure or stroke. ENDO:  Denies excessive thirst,  urinary  frequency or nocturia.   PHYSICAL EXAMINATION:  VITAL SIGNS:  Are as described above.  HEENT: Exam is within normal limits.  CHEST:  Clear.  BACK:  Reveals no CVA or point tenderness.  CARDIOVASCULAR:  Reveals normal S1 and S2. There are no rubs, murmurs or  gallops.  ABDOMEN:  There are vigorous bowel sounds. They do not seem to be high-  pitched. The abdomen is benign to palpation. There is no guarding or  rebound. The patient describes some tenderness in the mid epigastric area.  NEUROLOGIC TESTING AND EXAMINATION OF EXTREMITIES:  Normal.   IMPRESSION:  1.  Acute abdominal pain and ileus.  2.  CT scan finding of change in omental area of uncertain significance.  3.  Hypokalemia.  4.  History of adenocarcinoma of the pancreas.  5.  History of gastroesophageal reflux.  6.  Status post hysterectomy, cholecystectomy, and appendectomy.  7.  History of ventral hernia repair.  8.  History of multiple drug allergies.  9.  Hypertension.  10. Sarcoidosis.  11. History of nephrolithiasis.  12. History of migraine headache.   At this point will follow a conservative approach. Will give the patient  clear liquids, intravenous fluids, pain medicine. Will follow her status  closely. Will have Dr. Abbey Chatters and colleagues to see her in light of her  previous extensive surgery, and will review her CAT scan carefully. Will  give her some potassium supplementation in her IVs in light of her potassium  of 3.4.           ______________________________  Sherin Quarry, MD     SY/MEDQ  D:  03/18/2005  T:  03/18/2005  Job:  [id number]   cc:   Stacie Acres. Cliffton Asters, M.D.  Fax: [phone]   Pierce Crane, M.D.  Fax: [phone]   Adolph Pollack, M.D.  [address], [address]  [place]  [place] [address]

## 2010-11-01 NOTE — Op Note (Signed)
[facility]  Patient:    Nicole Montgomery, Nicole Montgomery                  MRN: [id number] Proc. Date: 12/28/00 Adm. Date:  [id number] Attending:  Arlis Porta CC:         Stacie Acres. Cliffton Asters, M.D.  Miguel Aschoff, M.D.   Operative Report  PREOPERATIVE DIAGNOSIS;  Cystic neoplasm of the tail of the pancreas.  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  PROCEDURE:  Exploratory laparotomy, distal splenectomy and pancreatectomy, mobilization of splenic flexure, and partial colectomy.  SURGEON:  Adolph Pollack, M.D.  ASSISTANT:  Ollen Gross. Vernell Morgans, M.D.  ANESTHESIA:  General.  INDICATION:  This 70 year old female was found to have a complex cystic mass of the distal pancreas that appeared to be involving the spleen and the distaL transverse colon.  It was symptomatic, and now she presents for elective resection.  TECHNIQUE:  She was placed supine on the operating table, and general anesthetic was administered.  The abdomen was sterilely prepped and draped.  A left subcostal incision was extended over to the right side incising the skin sharply.  The subcutaneous tissue, fascial layers, and musculature were divided with the cautery.  The abdomen was explored.  The liver was smooth without lesions.  I did not notice any celiac adenopathy.  I could palpate the mass in the left upper quadrant area.  I began by entering the lesser sac and dividing the short gastric vessels as far superiorly as I could.  It should be noted that the short gastric vessels were variceal in nature and under a lot of pressure.  I subsequently noted the adherence of the mass to the transverse colon and mobilized the splenic flexure and the descending colon.  I then divided the splenophrenic, splenocolic, and splenorenal ligaments and rotated the spleen and medialized it. I used the harmonic scalpel to incise the peritoneal  attachments.  Once I had medialized the spleen, I divided the remaining two short gastric vessels.  I then mobilized the distal pancreas bluntly and to a point proximal to where the mass was.  I was then able to divide branches of the splenic artery and splenic vein.  Using the linear noncutting stapler, I then transected the pancreas at least 2 cm proximal to the lesion.  I oversewed the pancreatic stump with interrupted 3-0 silk horizontal mattress sutures.  Next, I divided the colon with the linear cutting stapler, the distal two-thirds of the transverse colon, and the proximal and third of the descending colon.  I then divided the omentum that was involved with this, and the specimen was sent to pathology.  Frozen section analysis demonstrated a mucinous ______ irregularity that was potentially suspicious for malignancy.  No definitive malignant diagnosis could be made on frozen section.  I subsequently mobilized more of the descending colon and placed the transverse colon stump and the descending colon stump together in a side-to-side fashion and performed a side-to-side stapled anastomosis.  The remaining enterotomy of the anastomosis was closed with the nonlinear cutting stapler.  The anastomosis was patent, viable, under no tension.  The mesenteric defect was closed with interrupted silk sutures.  At this point, the gloves were changed.  I then irrigated out the abdominal cavity, and some bleeding points of the splenic area were controlled with the cautery.  Once hemostasis was adequate, I made a stab wound in the left lower quadrant  and placed a drain in the left upper quadrant with part of the drain near the distal pancreatic stump.  The drain was then anchored to the skin with a 3-0 nylon suture.  At this point, sponge, needle, and instrument counts were reported to be correct.  I subsequently closed the fascia in two layers, closing the posterior layer and the anterior  layer with a running #1 PDS suture.  The subcutaneous tissue and skin were irrigated, and the skin was closed with staples.  A sterile dressing was applied, and the drain was placed to closed suction.  She tolerated the procedure well without any apparent complications.  She did lose approximately 1800 cc of blood.  She was taken to the recovery room in satisfactory condition and will be taken to the intensive care unit from the recovery room. DD:  12/28/00 TD:  12/28/00 Job: [id number]

## 2010-11-01 NOTE — Op Note (Signed)
NAME:  Nicole Montgomery, Nicole Montgomery                            ACCOUNT NO.:  [id number]   MEDICAL RECORD NO.:  [id number]                   PATIENT TYPE:   LOCATION:                                       FACILITY:   PHYSICIAN:  Ines Bloomer, MD                DATE OF BIRTH:  December 25, 1940   DATE OF PROCEDURE:  03/30/2002  DATE OF DISCHARGE:                                 OPERATIVE REPORT   PREOPERATIVE DIAGNOSIS:  Status post resection of cancer of the pancreas,  mediastinal adenopathy.   POSTOPERATIVE DIAGNOSIS:  Sarcoidosis.   OPERATION PERFORMED:  Fiberoptic bronchoscopy and mediastinoscopy.   SURGEON:  Dr. Ines Bloomer.   ANESTHESIA:  General.   INDICATION FOR SURGERY:  This patient 30 years ago had a biopsy of the left  supraclavicular node and was told it had to be sarcoid.  She has underwent  no treatment but then was found to have a distal pancreatic tumor and  underwent resection by Dr. Abbey Chatters which revealed a pancreatic cancer.  She has done well but now in her staging was again found to have mediastinal  adenopathy.  Although sarcoid was suspected, we needed to rule out any type  of metastatic pancreatic cancer.   DESCRIPTION OF PROCEDURE:  After general anesthesia a fiberoptic  bronchoscope was passed through the endotracheal tube.  The right carina was  in the midline.  The right upper lobe, right middle lobe, and right lower  lobe orifices were normal.  There was some exudate and tracheitis and  bronchitis which was evident and this was sent for culture.  The left  mainstem, left upper lobe, and left lower lobe orifices were normal.  The  fiberoptic bronchoscope was removed.  Cytologies were also taken from the  tracheobronchial tree.   The anterior neck was prepped and draped in the usual sterile manner.  A  transverse incision was made and was carried down with electrocautery to the  subcutaneous tissue and fascia.  The pretracheal fascia was entered and  several nodes were palpated.  A video mediastinoscope was inserted, pictures  were taken, and biopsies of several 2R  nodes were done which revealed on frozen section non-necrotizing  granulomatous disease not consistent with sarcoid.  The strap muscles were  closed with 2-0 Vicryl subcutaneous tissue with 3-0 Vicryl.  Dermabond for  the skin.  The patient returned to the recovery room in stable condition.                                               Ines Bloomer, MD    DPB/MEDQ  D:  03/30/2002  T:  03/31/2002  Job:  [id number]   cc:   Merilynn Finland, M.D.  [address] [facility]  [place]  [place] [address]  Fax: [phone]   Adolph Pollack, M.D.  Fax: [phone]

## 2011-03-12 LAB — BASIC METABOLIC PANEL
GFR calc non Af Amer: >60
Potassium: 4.2
Sodium: 141

## 2011-03-12 LAB — URINALYSIS, ROUTINE W REFLEX MICROSCOPIC
Glucose, UA: NEGATIVE
Protein, ur: NEGATIVE
pH: 6

## 2011-03-12 LAB — DIFFERENTIAL
Eosinophils Relative: 1
Lymphocytes Relative: 25
Lymphs Abs: 2.2
Monocytes Absolute: 0.5

## 2011-03-12 LAB — CBC
HCT: 49.9 — ABNORMAL HIGH
Hemoglobin: 16.4 — ABNORMAL HIGH
RBC: 5.31 — ABNORMAL HIGH
WBC: 9.1

## 2011-03-12 LAB — LIPID PANEL
LDL Cholesterol: 131 — ABNORMAL HIGH
Total CHOL/HDL Ratio: 5.6
VLDL: 38

## 2011-03-12 LAB — HOMOCYSTEINE: Homocysteine: 8.6

## 2013-01-13 DIAGNOSIS — B0222 Postherpetic trigeminal neuralgia: Secondary | ICD-10-CM | POA: Insufficient documentation

## 2015-04-12 DIAGNOSIS — K219 Gastro-esophageal reflux disease without esophagitis: Secondary | ICD-10-CM | POA: Insufficient documentation

## 2015-04-12 DIAGNOSIS — I10 Essential (primary) hypertension: Secondary | ICD-10-CM | POA: Insufficient documentation

## 2015-04-12 DIAGNOSIS — E785 Hyperlipidemia, unspecified: Secondary | ICD-10-CM | POA: Insufficient documentation

## 2017-01-26 ENCOUNTER — Ambulatory Visit (INDEPENDENT_AMBULATORY_CARE_PROVIDER_SITE_OTHER): Payer: Self-pay | Admitting: Orthopedic Surgery

## 2017-01-28 ENCOUNTER — Encounter (INDEPENDENT_AMBULATORY_CARE_PROVIDER_SITE_OTHER): Payer: Self-pay | Admitting: Orthopedic Surgery

## 2017-01-28 ENCOUNTER — Ambulatory Visit (INDEPENDENT_AMBULATORY_CARE_PROVIDER_SITE_OTHER): Payer: Medicare Other | Admitting: Orthopedic Surgery

## 2017-01-28 ENCOUNTER — Ambulatory Visit (INDEPENDENT_AMBULATORY_CARE_PROVIDER_SITE_OTHER): Payer: Self-pay

## 2017-01-28 DIAGNOSIS — M79672 Pain in left foot: Secondary | ICD-10-CM | POA: Diagnosis not present

## 2017-01-28 DIAGNOSIS — M722 Plantar fascial fibromatosis: Secondary | ICD-10-CM | POA: Diagnosis not present

## 2017-01-28 NOTE — Progress Notes (Signed)
Office Visit Note   Patient: Nicole Montgomery           Date of Birth: 25-Aug-1940           MRN: [id number] Visit Date: 01/28/2017              Requested by: No referring provider defined for this encounter. PCP: No primary care provider on file.  Chief Complaint  Patient presents with  . Left Foot - Pain      HPI: Patient is a 76 year old woman who presents complaining of 2 year history of plantar fasciitis on the left. She states she feels a nail is in her heel. She states it's worse in the morning worse with start up. She states she's done her stretches with rolling her foot on a frozen bottle she has had an injection with lasted 2 weeks. Patient states she had to take care of a daughter with terminal cancer for a year. Past medical history is updated review of systems positive for high cholesterol neuropathy and hypertension. Patient states she cannot take nonsteroidals due to peptic ulcer disease.  Assessment & Plan: Visit Diagnoses:  1. Pain in left foot   2. Plantar fasciitis, left     Plan: Recommended heel cord stretching this was demonstrated to her recommended a stiff soled sneaker with a over-the-counter orthotic to provide more support plantar fascial she is currently wearing flip-flops. Discussed that she cannot resolve the plantar fasciitis with heel cord stretching that we would need to proceed with a plantar fascial release. Heel cord stretches were demonstrated and she will do this 5 times a day millimeters time.  Follow-Up Instructions: Return if symptoms worsen or fail to improve.   Ortho Exam  Patient is alert, oriented, no adenopathy, well-dressed, normal affect, normal respiratory effort. Examination she has a normal gait. She has good pulses she has good ankle good subtalar motion dorsiflexion to neutral with her knee extended. She has no pain to palpation over the posterior tibial tendon or peroneal tendons. Lateral compression of the calcaneus is minimally  tender to palpation the tarsal tunnel is nontender to palpation she is maximally tender to palpation in the origin of plantar fascia. There is no skin color changes.  Imaging: Xr Foot Complete Left  Result Date: 01/28/2017 2 view radiographs of the left foot shows a small calcaneal spur no evidence of stress fractures.  No images are attached to the encounter.  Labs: No results found for: HGBA1C, ESRSEDRATE, CRP, LABURIC, REPTSTATUS, GRAMSTAIN, CULT, LABORGA  Orders:  Orders Placed This Encounter  Procedures  . XR Foot Complete Left   No orders of the defined types were placed in this encounter.    Procedures: No procedures performed  Clinical Data: No additional findings.  ROS:  All other systems negative, except as noted in the HPI. Review of Systems  Objective: Vital Signs: There were no vitals taken for this visit.  Specialty Comments:  No specialty comments available.  PMFS History: Patient Active Problem List   Diagnosis Date Noted  . Pain in left foot 01/28/2017  . Plantar fasciitis, left 01/28/2017   No past medical history on file.  No family history on file.  No past surgical history on file. Social History   Occupational History  . Not on file.   Social History Main Topics  . Smoking status: Never Smoker  . Smokeless tobacco: Never Used  . Alcohol use Not on file  . Drug use: Unknown  . Sexual  activity: Not on file

## 2017-10-09 DIAGNOSIS — E785 Hyperlipidemia, unspecified: Secondary | ICD-10-CM | POA: Diagnosis not present

## 2017-10-09 DIAGNOSIS — K219 Gastro-esophageal reflux disease without esophagitis: Secondary | ICD-10-CM | POA: Diagnosis not present

## 2017-10-09 DIAGNOSIS — R55 Syncope and collapse: Secondary | ICD-10-CM | POA: Diagnosis not present

## 2017-10-09 DIAGNOSIS — T50905A Adverse effect of unspecified drugs, medicaments and biological substances, initial encounter: Secondary | ICD-10-CM | POA: Diagnosis not present

## 2017-10-09 DIAGNOSIS — I959 Hypotension, unspecified: Secondary | ICD-10-CM | POA: Diagnosis not present

## 2017-10-10 DIAGNOSIS — K219 Gastro-esophageal reflux disease without esophagitis: Secondary | ICD-10-CM | POA: Diagnosis not present

## 2017-10-10 DIAGNOSIS — I959 Hypotension, unspecified: Secondary | ICD-10-CM | POA: Diagnosis not present

## 2017-10-10 DIAGNOSIS — E785 Hyperlipidemia, unspecified: Secondary | ICD-10-CM | POA: Diagnosis not present

## 2017-10-10 DIAGNOSIS — T50905A Adverse effect of unspecified drugs, medicaments and biological substances, initial encounter: Secondary | ICD-10-CM | POA: Diagnosis not present

## 2018-03-08 ENCOUNTER — Telehealth: Payer: Self-pay | Admitting: Pharmacist

## 2018-03-08 ENCOUNTER — Other Ambulatory Visit: Payer: Self-pay

## 2018-03-08 NOTE — Patient Outreach (Signed)
[facility]) Care Management  [place]   03/08/2018  Nicole Montgomery 03/04/1941 [id number]  Subjective: Patient was called regarding medication assistance. HIPAA identifiers were obtained. Patient is a 77 year old female with multiple medical conditions including but not limited to:  Hypertension, GERD, hyperlipidemia, Plantar fascitis, and postherpetic trigeminal neuralgia.  Patient reported having issues affording Ozempic.  She has [employer].    Objective:   Encounter Medications: Outpatient Encounter Medications as of 03/08/2018  Medication Sig  . acetaminophen (TYLENOL) 650 MG CR tablet Take 1 tablet by mouth every 8 (eight) hours as needed.  Marland Kitchen amitriptyline (ELAVIL) 50 MG tablet Take 1 tablet by mouth at bedtime.  Marland Kitchen aspirin 81 MG chewable tablet Chew 81 mg by mouth daily.   . celecoxib (CELEBREX) 100 MG capsule Take 100 mg by mouth daily.  Marland Kitchen gabapentin (NEURONTIN) 300 MG capsule Take 300 mg by mouth 3 (three) times daily.   . ondansetron (ZOFRAN) 4 MG tablet Take 4 mg by mouth every 8 (eight) hours as needed.  Marland Kitchen OZEMPIC, 0.25 OR 0.5 MG/DOSE, 2 MG/1.5ML SOPN Inject 0.5 mg into the skin once a week.  . polyethylene glycol powder (GLYCOLAX/MIRALAX) powder Take 17 g by mouth daily.  . pravastatin (PRAVACHOL) 80 MG tablet   . ranitidine (ZANTAC) 300 MG tablet Take 300 mg by mouth daily.   . [DISCONTINUED] lisinopril (PRINIVIL,ZESTRIL) 5 MG tablet   . [DISCONTINUED] pioglitazone (ACTOS) 45 MG tablet   . [DISCONTINUED] triamterene-hydrochlorothiazide (YKDXIPJ-82) 37.5-25 MG tablet    No facility-administered encounter medications on file as of 03/08/2018.     Assessment: Patient's medications were reviewed via telephone:   Drugs sorted by system:  Neurologic/Psychologic: Amitriptyline, Gabapentin,   Cardiovascular: Aspirin, Pravastatin,   Gastrointestinal: Ondansetron, Polyethylene Glycol, Ranitidine  Pain: Acetaminophen, Celecoxib,    Miscellaneous: Ozempic   Medication Assistance Findings:  -Patient has [employer] -She is over income for the Extra Help Program. (An application was completed because the patient forgot to mention her husband's retirement pension.)  -Patient may qualify for [employer] program for [place]' program require's patients to spend at least $1100 in out-of-pocket medication expenses and requires proof of income.  -Patient communicated understanding about the necessary financial paper work and will send the [place] letters for herself and her husband as well as the 1099 for his retirement.  -Patient has shredded all of her EOB's from [place]. She will keep the one for September that should be arriving in 2 weeks.  -Patient will also get a print-out from [place]: Route letter to Nicole Montgomery to send a Novo application to the patient.  Follow up with the patient in 2 weeks to see if she received her app and found her documents.  Nicole Montgomery, PharmD, [place] Clinical Pharmacist [phone]

## 2018-03-08 NOTE — Patient Outreach (Signed)
[facility]) Care Management  03/08/2018  OMAYA NIELAND 23-Nov-1940 [id number]   Telephone Screen  Referral Date: 03/08/18 Referral Source: MD office Referral Reason: " DM,HTN, care mgmt and pharmacy help to get meds" Insurance: [facility]   Outreach attempt # 1 to patient. Spoke with patient and screening completed.  Social:Patient resides in her home. She voices that she is independent with ADLs/IADLs. She denies any recent falls and states she does not use any assistive devices. Patient drives herself to medical appts.    Conditions: Per chart review, patient has PMH of plantar fasciitis, HTN, HDL, GERD and DM. Patient voices that she is monitoring cbgs once a day. She states that her blood sugars have been bette controlled since starting her new diabetic med. Patient voices cbg this morning was 120. She denies needing any further educational and support in managing her chronic conditions at this time.    Medications: Patient voices that she just went into the donut hole. She has been advised that her insulin injection will now cost $200/month. Patient voices that she will be unable to afford her medication anymore. She has spoken with PCP regarding this and they have a few samples in the office for her to pick up.    Appointments: Patient followed by PCP-last seen on 01/11/18.   Consent: [facility] services reviewed and discussed with patient. Verbal consent for pharmacy assistance given. Patient denies needing any other assistance at this time.    Plan: RN CM will send [facility] pharmacy referral for possible med assistance.   Enzo Montgomery, RN,BSN,CCM [place] Management Telephonic Care Management Coordinator Direct Phone: [phone] Toll Free: [phone] Fax: [phone]

## 2018-03-09 ENCOUNTER — Other Ambulatory Visit: Payer: Self-pay | Admitting: Pharmacy Technician

## 2018-03-09 NOTE — Patient Outreach (Signed)
[facility]) Care Management  03/09/2018  Nicole Montgomery 17-Aug-1940 [id number]   Received Novo Nordisk patient assistance referral from [place] for [employer]. Prepared patient portion to be mailed. Faxed provider portion to Dr Marco Collie.  Will followup with patient in 7-10 business days to confirm application has been received.  [name] P. [name], [place] Management [phone]

## 2018-03-18 ENCOUNTER — Other Ambulatory Visit: Payer: Self-pay | Admitting: Pharmacy Technician

## 2018-03-18 NOTE — Patient Outreach (Signed)
[facility] [facility]) Care [place]  03/18/2018  Nicole Montgomery Nov 07, 1940 [id number]    Successful outreach call to patient regarding status of Medication Assistance paperwork. HIPAA identifiers obtained. Patient stated that she had not mailed in application yet but was going to make it a priority today.   Will followup with patient in 14-21 business days to confirm application was completed and mailed.  Nicole Montgomery P. Nicole Montgomery, Nicole Montgomery [phone]

## 2018-03-19 ENCOUNTER — Telehealth: Payer: Self-pay | Admitting: Pharmacist

## 2018-03-19 NOTE — Patient Outreach (Signed)
[place] [facility]) Care Management  03/19/2018  SHELSIE TIJERINO Nov 06, 1940 [id number]   Patient was called regarding medication assistance. HIPAA identifiers were obtained. Patient left a message on [employer] stating she had only spent $900 in out of pocket medication expenses.  When asked about other refills, patient said she has several medications to fill in the next few days/weeks. She was informed that [employer] closes their program to new applications by 68/03/21.  Patient communicated understanding and said she would call back with her new TROOP by the end of October.  Plan: Route note to Certified Pharmacy Technician, Sharee Pimple [place] for follow up.   Elayne Guerin, PharmD, [place] Clinical Pharmacist [phone]

## 2018-03-22 ENCOUNTER — Ambulatory Visit: Payer: Self-pay | Admitting: Pharmacist

## 2018-03-25 ENCOUNTER — Other Ambulatory Visit: Payer: Self-pay | Admitting: Pharmacy Technician

## 2018-03-25 NOTE — Patient Outreach (Signed)
[facility] [facility]) Care Management  03/25/2018  Nicole Montgomery 1940-12-19 [id number]   Follow up call to Dr. Marco Collie office in regards to [employer] patient assistance application for clarification of Ozempic dose. Spoke to [employer] who said the Ozempic is 0.5mg  sq once a week.  Once application has been received from the patient will submit completed application to [employer] patient assistance.  Nicole Montgomery P. [name], [place] Management [phone]

## 2018-04-13 ENCOUNTER — Other Ambulatory Visit: Payer: Self-pay | Admitting: Pharmacy Technician

## 2018-04-13 NOTE — Patient Outreach (Signed)
[employer] [facility]) Care Management  04/13/2018  Nicole Montgomery 07-Nov-1940 [id number]  Successful followup call to patient, HIPAA identifiers verifed, in regards to her OOP.  Spoke to [name] and she said she still has not met the out of pocket requirement of $1000.00 and she does not foresee herself meeting it by the end of November when this program ends. She stated she received  samples from her dr yesterday. She states next year she will probably be in the doughnut hole around May and will reach out to [place] for help if that occurs. Confirmed with patient our phone numbers.  Will route note to [place] for followup/case closure.  Sherrise Liberto P. Mikalyn Hermida, [place] Management [phone]

## 2018-04-15 ENCOUNTER — Telehealth: Payer: Self-pay | Admitting: Pharmacist

## 2018-04-15 NOTE — Patient Outreach (Signed)
[facility]) Care Management  04/15/2018  Nicole Montgomery 28-Oct-1940 [id number]   Case closure due to the patient not meeting the required out-of-pocket spend. Patient reported her provider gave her samples that will last her for the rest of the year.   Plan: Close pharmacy case.  Elayne Guerin, PharmD, [place] Clinical Pharmacist [phone]

## 2018-05-03 ENCOUNTER — Encounter: Payer: Self-pay | Admitting: Pharmacist

## 2018-07-08 DIAGNOSIS — E1159 Type 2 diabetes mellitus with other circulatory complications: Secondary | ICD-10-CM | POA: Diagnosis not present

## 2018-07-08 DIAGNOSIS — E1140 Type 2 diabetes mellitus with diabetic neuropathy, unspecified: Secondary | ICD-10-CM | POA: Diagnosis not present

## 2018-07-08 DIAGNOSIS — E782 Mixed hyperlipidemia: Secondary | ICD-10-CM | POA: Diagnosis not present

## 2018-07-15 DIAGNOSIS — I10 Essential (primary) hypertension: Secondary | ICD-10-CM | POA: Diagnosis not present

## 2018-07-15 DIAGNOSIS — Z1331 Encounter for screening for depression: Secondary | ICD-10-CM | POA: Diagnosis not present

## 2018-07-15 DIAGNOSIS — Z0000 Encounter for general adult medical examination without abnormal findings: Secondary | ICD-10-CM | POA: Diagnosis not present

## 2018-07-15 DIAGNOSIS — E782 Mixed hyperlipidemia: Secondary | ICD-10-CM | POA: Diagnosis not present

## 2018-07-15 DIAGNOSIS — E1159 Type 2 diabetes mellitus with other circulatory complications: Secondary | ICD-10-CM | POA: Diagnosis not present

## 2018-07-15 DIAGNOSIS — E1140 Type 2 diabetes mellitus with diabetic neuropathy, unspecified: Secondary | ICD-10-CM | POA: Diagnosis not present

## 2018-07-15 DIAGNOSIS — Z139 Encounter for screening, unspecified: Secondary | ICD-10-CM | POA: Diagnosis not present

## 2018-11-17 ENCOUNTER — Other Ambulatory Visit: Payer: Self-pay

## 2018-11-17 NOTE — Patient Outreach (Signed)
[place]) Care Management  11/17/2018  SHARMAIN LASTRA [birth year] [id number]    Late Entry   Successful outreach to the patient on 10/01/2018 for HTA/HRA engagement screening.  HIPAA verified.  The patient states that she is active with her physician.  She states she has diabetes and at one time it was out of control but she has her blood sugars under control now.  She has all of the equipment that she needs and she checks her blood sugar daily.  She declines education for diabetes at this time. She has an advanced directive in place and did not express any problems with medications or transportation.  Discussed [facility] services and she agrees to a six month follow up.  Plan:  Patient will be placed in a [place] with a six month follow up.  RN Health Coach will send [facility] Welcome Letter.  RN Health Coach will contact patient in the month of October and patient agrees to next outreach.   [name] RN, BSN, [place] Direct Dial:  [phone]  Fax: [phone]

## 2018-11-23 DIAGNOSIS — E782 Mixed hyperlipidemia: Secondary | ICD-10-CM | POA: Diagnosis not present

## 2018-11-23 DIAGNOSIS — E1159 Type 2 diabetes mellitus with other circulatory complications: Secondary | ICD-10-CM | POA: Diagnosis not present

## 2018-11-23 DIAGNOSIS — E1140 Type 2 diabetes mellitus with diabetic neuropathy, unspecified: Secondary | ICD-10-CM | POA: Diagnosis not present

## 2018-11-29 DIAGNOSIS — E782 Mixed hyperlipidemia: Secondary | ICD-10-CM | POA: Diagnosis not present

## 2018-11-29 DIAGNOSIS — I10 Essential (primary) hypertension: Secondary | ICD-10-CM | POA: Diagnosis not present

## 2018-11-29 DIAGNOSIS — E1159 Type 2 diabetes mellitus with other circulatory complications: Secondary | ICD-10-CM | POA: Diagnosis not present

## 2018-11-29 DIAGNOSIS — E1140 Type 2 diabetes mellitus with diabetic neuropathy, unspecified: Secondary | ICD-10-CM | POA: Diagnosis not present

## 2019-01-04 DIAGNOSIS — S20212A Contusion of left front wall of thorax, initial encounter: Secondary | ICD-10-CM | POA: Diagnosis not present

## 2019-01-18 ENCOUNTER — Other Ambulatory Visit: Payer: Self-pay

## 2019-01-18 NOTE — Patient Outreach (Signed)
[name]) Care Management  01/18/2019  Nicole Montgomery [birth year] [id number]    Late entry  [place] closing the program.  Patient is transitioning to external program [employer] for continued case management.  [name] RN, BSN, [place] Direct Dial:  [phone]  Fax: [phone]

## 2019-03-24 DIAGNOSIS — E1159 Type 2 diabetes mellitus with other circulatory complications: Secondary | ICD-10-CM | POA: Diagnosis not present

## 2019-03-24 DIAGNOSIS — E1140 Type 2 diabetes mellitus with diabetic neuropathy, unspecified: Secondary | ICD-10-CM | POA: Diagnosis not present

## 2019-03-24 DIAGNOSIS — E782 Mixed hyperlipidemia: Secondary | ICD-10-CM | POA: Diagnosis not present

## 2019-03-31 ENCOUNTER — Ambulatory Visit: Payer: Medicare Other

## 2019-03-31 DIAGNOSIS — E782 Mixed hyperlipidemia: Secondary | ICD-10-CM | POA: Diagnosis not present

## 2019-03-31 DIAGNOSIS — I10 Essential (primary) hypertension: Secondary | ICD-10-CM | POA: Diagnosis not present

## 2019-03-31 DIAGNOSIS — Z9081 Acquired absence of spleen: Secondary | ICD-10-CM | POA: Diagnosis not present

## 2019-03-31 DIAGNOSIS — Z139 Encounter for screening, unspecified: Secondary | ICD-10-CM | POA: Diagnosis not present

## 2019-03-31 DIAGNOSIS — E1159 Type 2 diabetes mellitus with other circulatory complications: Secondary | ICD-10-CM | POA: Diagnosis not present

## 2019-03-31 DIAGNOSIS — Z23 Encounter for immunization: Secondary | ICD-10-CM | POA: Diagnosis not present

## 2019-08-15 DIAGNOSIS — E782 Mixed hyperlipidemia: Secondary | ICD-10-CM | POA: Diagnosis not present

## 2019-08-15 DIAGNOSIS — E1159 Type 2 diabetes mellitus with other circulatory complications: Secondary | ICD-10-CM | POA: Diagnosis not present

## 2019-08-15 DIAGNOSIS — Z1339 Encounter for screening examination for other mental health and behavioral disorders: Secondary | ICD-10-CM | POA: Diagnosis not present

## 2019-08-15 DIAGNOSIS — Z7189 Other specified counseling: Secondary | ICD-10-CM | POA: Diagnosis not present

## 2019-08-15 DIAGNOSIS — Z0000 Encounter for general adult medical examination without abnormal findings: Secondary | ICD-10-CM | POA: Diagnosis not present

## 2019-08-15 DIAGNOSIS — Z139 Encounter for screening, unspecified: Secondary | ICD-10-CM | POA: Diagnosis not present

## 2019-08-15 DIAGNOSIS — Z1331 Encounter for screening for depression: Secondary | ICD-10-CM | POA: Diagnosis not present

## 2019-08-15 DIAGNOSIS — Z136 Encounter for screening for cardiovascular disorders: Secondary | ICD-10-CM | POA: Diagnosis not present

## 2019-08-22 DIAGNOSIS — N183 Chronic kidney disease, stage 3 unspecified: Secondary | ICD-10-CM | POA: Diagnosis not present

## 2019-08-22 DIAGNOSIS — E782 Mixed hyperlipidemia: Secondary | ICD-10-CM | POA: Diagnosis not present

## 2019-08-22 DIAGNOSIS — Z139 Encounter for screening, unspecified: Secondary | ICD-10-CM | POA: Diagnosis not present

## 2019-08-22 DIAGNOSIS — Z1331 Encounter for screening for depression: Secondary | ICD-10-CM | POA: Diagnosis not present

## 2019-08-22 DIAGNOSIS — I152 Hypertension secondary to endocrine disorders: Secondary | ICD-10-CM | POA: Diagnosis not present

## 2019-08-22 DIAGNOSIS — E1159 Type 2 diabetes mellitus with other circulatory complications: Secondary | ICD-10-CM | POA: Diagnosis not present

## 2019-09-06 DIAGNOSIS — N183 Chronic kidney disease, stage 3 unspecified: Secondary | ICD-10-CM | POA: Diagnosis not present

## 2019-09-06 DIAGNOSIS — E1159 Type 2 diabetes mellitus with other circulatory complications: Secondary | ICD-10-CM | POA: Diagnosis not present

## 2019-09-06 DIAGNOSIS — I152 Hypertension secondary to endocrine disorders: Secondary | ICD-10-CM | POA: Diagnosis not present

## 2019-11-29 DIAGNOSIS — Z789 Other specified health status: Secondary | ICD-10-CM | POA: Diagnosis not present

## 2019-12-05 DIAGNOSIS — B0222 Postherpetic trigeminal neuralgia: Secondary | ICD-10-CM | POA: Diagnosis not present

## 2019-12-05 DIAGNOSIS — Z139 Encounter for screening, unspecified: Secondary | ICD-10-CM | POA: Diagnosis not present

## 2019-12-05 DIAGNOSIS — Z6825 Body mass index (BMI) 25.0-25.9, adult: Secondary | ICD-10-CM | POA: Diagnosis not present

## 2019-12-05 DIAGNOSIS — E1140 Type 2 diabetes mellitus with diabetic neuropathy, unspecified: Secondary | ICD-10-CM | POA: Diagnosis not present

## 2019-12-14 DIAGNOSIS — I152 Hypertension secondary to endocrine disorders: Secondary | ICD-10-CM | POA: Diagnosis not present

## 2019-12-14 DIAGNOSIS — E1159 Type 2 diabetes mellitus with other circulatory complications: Secondary | ICD-10-CM | POA: Diagnosis not present

## 2019-12-14 DIAGNOSIS — E1140 Type 2 diabetes mellitus with diabetic neuropathy, unspecified: Secondary | ICD-10-CM | POA: Diagnosis not present

## 2019-12-14 DIAGNOSIS — N183 Chronic kidney disease, stage 3 unspecified: Secondary | ICD-10-CM | POA: Diagnosis not present

## 2020-01-12 DIAGNOSIS — E782 Mixed hyperlipidemia: Secondary | ICD-10-CM | POA: Diagnosis not present

## 2020-01-12 DIAGNOSIS — E1159 Type 2 diabetes mellitus with other circulatory complications: Secondary | ICD-10-CM | POA: Diagnosis not present

## 2020-01-15 DIAGNOSIS — E1140 Type 2 diabetes mellitus with diabetic neuropathy, unspecified: Secondary | ICD-10-CM | POA: Diagnosis not present

## 2020-01-15 DIAGNOSIS — I152 Hypertension secondary to endocrine disorders: Secondary | ICD-10-CM | POA: Diagnosis not present

## 2020-01-15 DIAGNOSIS — N183 Chronic kidney disease, stage 3 unspecified: Secondary | ICD-10-CM | POA: Diagnosis not present

## 2020-01-15 DIAGNOSIS — E1159 Type 2 diabetes mellitus with other circulatory complications: Secondary | ICD-10-CM | POA: Diagnosis not present

## 2020-02-10 DIAGNOSIS — Z6825 Body mass index (BMI) 25.0-25.9, adult: Secondary | ICD-10-CM | POA: Diagnosis not present

## 2020-02-10 DIAGNOSIS — E1140 Type 2 diabetes mellitus with diabetic neuropathy, unspecified: Secondary | ICD-10-CM | POA: Diagnosis not present

## 2020-02-10 DIAGNOSIS — E1159 Type 2 diabetes mellitus with other circulatory complications: Secondary | ICD-10-CM | POA: Diagnosis not present

## 2020-02-10 DIAGNOSIS — I152 Hypertension secondary to endocrine disorders: Secondary | ICD-10-CM | POA: Diagnosis not present

## 2020-02-14 DIAGNOSIS — I152 Hypertension secondary to endocrine disorders: Secondary | ICD-10-CM | POA: Diagnosis not present

## 2020-02-14 DIAGNOSIS — E1159 Type 2 diabetes mellitus with other circulatory complications: Secondary | ICD-10-CM | POA: Diagnosis not present

## 2020-02-14 DIAGNOSIS — E1140 Type 2 diabetes mellitus with diabetic neuropathy, unspecified: Secondary | ICD-10-CM | POA: Diagnosis not present

## 2020-02-14 DIAGNOSIS — N183 Chronic kidney disease, stage 3 unspecified: Secondary | ICD-10-CM | POA: Diagnosis not present

## 2020-03-16 DIAGNOSIS — E1140 Type 2 diabetes mellitus with diabetic neuropathy, unspecified: Secondary | ICD-10-CM | POA: Diagnosis not present

## 2020-03-16 DIAGNOSIS — E1159 Type 2 diabetes mellitus with other circulatory complications: Secondary | ICD-10-CM | POA: Diagnosis not present

## 2020-03-16 DIAGNOSIS — N183 Chronic kidney disease, stage 3 unspecified: Secondary | ICD-10-CM | POA: Diagnosis not present

## 2020-03-16 DIAGNOSIS — I152 Hypertension secondary to endocrine disorders: Secondary | ICD-10-CM | POA: Diagnosis not present

## 2020-04-16 DIAGNOSIS — E1159 Type 2 diabetes mellitus with other circulatory complications: Secondary | ICD-10-CM | POA: Diagnosis not present

## 2020-04-16 DIAGNOSIS — E1140 Type 2 diabetes mellitus with diabetic neuropathy, unspecified: Secondary | ICD-10-CM | POA: Diagnosis not present

## 2020-04-16 DIAGNOSIS — N183 Chronic kidney disease, stage 3 unspecified: Secondary | ICD-10-CM | POA: Diagnosis not present

## 2020-04-16 DIAGNOSIS — I152 Hypertension secondary to endocrine disorders: Secondary | ICD-10-CM | POA: Diagnosis not present

## 2020-06-16 DIAGNOSIS — E1159 Type 2 diabetes mellitus with other circulatory complications: Secondary | ICD-10-CM | POA: Diagnosis not present

## 2020-06-16 DIAGNOSIS — N183 Chronic kidney disease, stage 3 unspecified: Secondary | ICD-10-CM | POA: Diagnosis not present

## 2020-06-16 DIAGNOSIS — I152 Hypertension secondary to endocrine disorders: Secondary | ICD-10-CM | POA: Diagnosis not present

## 2020-06-16 DIAGNOSIS — E1140 Type 2 diabetes mellitus with diabetic neuropathy, unspecified: Secondary | ICD-10-CM | POA: Diagnosis not present

## 2020-07-04 DIAGNOSIS — E1159 Type 2 diabetes mellitus with other circulatory complications: Secondary | ICD-10-CM | POA: Diagnosis not present

## 2020-07-04 DIAGNOSIS — M533 Sacrococcygeal disorders, not elsewhere classified: Secondary | ICD-10-CM | POA: Diagnosis not present

## 2020-07-04 DIAGNOSIS — I152 Hypertension secondary to endocrine disorders: Secondary | ICD-10-CM | POA: Diagnosis not present

## 2020-07-04 DIAGNOSIS — E782 Mixed hyperlipidemia: Secondary | ICD-10-CM | POA: Diagnosis not present

## 2020-07-10 DIAGNOSIS — Z6825 Body mass index (BMI) 25.0-25.9, adult: Secondary | ICD-10-CM | POA: Diagnosis not present

## 2020-07-10 DIAGNOSIS — M533 Sacrococcygeal disorders, not elsewhere classified: Secondary | ICD-10-CM | POA: Diagnosis not present

## 2020-07-10 DIAGNOSIS — N183 Chronic kidney disease, stage 3 unspecified: Secondary | ICD-10-CM | POA: Diagnosis not present

## 2020-07-10 DIAGNOSIS — G8929 Other chronic pain: Secondary | ICD-10-CM | POA: Diagnosis not present

## 2020-07-17 DIAGNOSIS — N183 Chronic kidney disease, stage 3 unspecified: Secondary | ICD-10-CM | POA: Diagnosis not present

## 2020-07-17 DIAGNOSIS — M533 Sacrococcygeal disorders, not elsewhere classified: Secondary | ICD-10-CM | POA: Diagnosis not present

## 2020-07-17 DIAGNOSIS — E782 Mixed hyperlipidemia: Secondary | ICD-10-CM | POA: Diagnosis not present

## 2020-07-17 DIAGNOSIS — E1140 Type 2 diabetes mellitus with diabetic neuropathy, unspecified: Secondary | ICD-10-CM | POA: Diagnosis not present

## 2020-08-14 DIAGNOSIS — N183 Chronic kidney disease, stage 3 unspecified: Secondary | ICD-10-CM | POA: Diagnosis not present

## 2020-08-14 DIAGNOSIS — M533 Sacrococcygeal disorders, not elsewhere classified: Secondary | ICD-10-CM | POA: Diagnosis not present

## 2020-08-14 DIAGNOSIS — E782 Mixed hyperlipidemia: Secondary | ICD-10-CM | POA: Diagnosis not present

## 2020-08-14 DIAGNOSIS — E1140 Type 2 diabetes mellitus with diabetic neuropathy, unspecified: Secondary | ICD-10-CM | POA: Diagnosis not present

## 2020-08-23 DIAGNOSIS — Z6824 Body mass index (BMI) 24.0-24.9, adult: Secondary | ICD-10-CM | POA: Diagnosis not present

## 2020-08-23 DIAGNOSIS — R600 Localized edema: Secondary | ICD-10-CM | POA: Diagnosis not present

## 2020-09-14 DIAGNOSIS — E1140 Type 2 diabetes mellitus with diabetic neuropathy, unspecified: Secondary | ICD-10-CM | POA: Diagnosis not present

## 2020-09-14 DIAGNOSIS — I129 Hypertensive chronic kidney disease with stage 1 through stage 4 chronic kidney disease, or unspecified chronic kidney disease: Secondary | ICD-10-CM | POA: Diagnosis not present

## 2020-09-14 DIAGNOSIS — N183 Chronic kidney disease, stage 3 unspecified: Secondary | ICD-10-CM | POA: Diagnosis not present

## 2020-10-14 DIAGNOSIS — E1140 Type 2 diabetes mellitus with diabetic neuropathy, unspecified: Secondary | ICD-10-CM | POA: Diagnosis not present

## 2020-10-14 DIAGNOSIS — I129 Hypertensive chronic kidney disease with stage 1 through stage 4 chronic kidney disease, or unspecified chronic kidney disease: Secondary | ICD-10-CM | POA: Diagnosis not present

## 2020-10-14 DIAGNOSIS — N183 Chronic kidney disease, stage 3 unspecified: Secondary | ICD-10-CM | POA: Diagnosis not present

## 2020-10-22 DIAGNOSIS — E782 Mixed hyperlipidemia: Secondary | ICD-10-CM | POA: Diagnosis not present

## 2020-10-22 DIAGNOSIS — E1159 Type 2 diabetes mellitus with other circulatory complications: Secondary | ICD-10-CM | POA: Diagnosis not present

## 2020-10-22 DIAGNOSIS — E1140 Type 2 diabetes mellitus with diabetic neuropathy, unspecified: Secondary | ICD-10-CM | POA: Diagnosis not present

## 2020-10-22 DIAGNOSIS — I10 Essential (primary) hypertension: Secondary | ICD-10-CM | POA: Diagnosis not present

## 2020-10-29 DIAGNOSIS — Z1331 Encounter for screening for depression: Secondary | ICD-10-CM | POA: Diagnosis not present

## 2020-10-29 DIAGNOSIS — I152 Hypertension secondary to endocrine disorders: Secondary | ICD-10-CM | POA: Diagnosis not present

## 2020-10-29 DIAGNOSIS — Z136 Encounter for screening for cardiovascular disorders: Secondary | ICD-10-CM | POA: Diagnosis not present

## 2020-10-29 DIAGNOSIS — Z1339 Encounter for screening examination for other mental health and behavioral disorders: Secondary | ICD-10-CM | POA: Diagnosis not present

## 2020-10-29 DIAGNOSIS — E1159 Type 2 diabetes mellitus with other circulatory complications: Secondary | ICD-10-CM | POA: Diagnosis not present

## 2020-10-29 DIAGNOSIS — E1140 Type 2 diabetes mellitus with diabetic neuropathy, unspecified: Secondary | ICD-10-CM | POA: Diagnosis not present

## 2020-10-29 DIAGNOSIS — E782 Mixed hyperlipidemia: Secondary | ICD-10-CM | POA: Diagnosis not present

## 2020-10-29 DIAGNOSIS — Z0000 Encounter for general adult medical examination without abnormal findings: Secondary | ICD-10-CM | POA: Diagnosis not present

## 2020-11-14 DIAGNOSIS — N183 Chronic kidney disease, stage 3 unspecified: Secondary | ICD-10-CM | POA: Diagnosis not present

## 2020-11-14 DIAGNOSIS — I129 Hypertensive chronic kidney disease with stage 1 through stage 4 chronic kidney disease, or unspecified chronic kidney disease: Secondary | ICD-10-CM | POA: Diagnosis not present

## 2020-11-14 DIAGNOSIS — E1140 Type 2 diabetes mellitus with diabetic neuropathy, unspecified: Secondary | ICD-10-CM | POA: Diagnosis not present

## 2020-12-14 DIAGNOSIS — N183 Chronic kidney disease, stage 3 unspecified: Secondary | ICD-10-CM | POA: Diagnosis not present

## 2020-12-14 DIAGNOSIS — E1140 Type 2 diabetes mellitus with diabetic neuropathy, unspecified: Secondary | ICD-10-CM | POA: Diagnosis not present

## 2020-12-14 DIAGNOSIS — I129 Hypertensive chronic kidney disease with stage 1 through stage 4 chronic kidney disease, or unspecified chronic kidney disease: Secondary | ICD-10-CM | POA: Diagnosis not present

## 2021-01-08 ENCOUNTER — Telehealth: Payer: Self-pay | Admitting: Pharmacy Technician

## 2021-01-08 DIAGNOSIS — Z596 Low income: Secondary | ICD-10-CM

## 2021-01-08 NOTE — Progress Notes (Signed)
[facility] [facility])                                            [place] Team    01/08/2021  KINDSEY CHESSON 12-03-1940 [id number]                                      Medication Assistance Referral  Referral From: [place]  Medication/Company: [employer] / [employer] Patient application portion:  Education officer, museum portion: Faxed  to Dr. Marco Collie Provider address/fax verified via: Office website    [employer]. Romani Wilbon, [place]  [phone]

## 2021-01-14 DIAGNOSIS — E1140 Type 2 diabetes mellitus with diabetic neuropathy, unspecified: Secondary | ICD-10-CM | POA: Diagnosis not present

## 2021-01-14 DIAGNOSIS — N183 Chronic kidney disease, stage 3 unspecified: Secondary | ICD-10-CM | POA: Diagnosis not present

## 2021-01-14 DIAGNOSIS — I129 Hypertensive chronic kidney disease with stage 1 through stage 4 chronic kidney disease, or unspecified chronic kidney disease: Secondary | ICD-10-CM | POA: Diagnosis not present

## 2021-01-25 ENCOUNTER — Telehealth: Payer: Self-pay | Admitting: Pharmacy Technician

## 2021-01-25 NOTE — Progress Notes (Signed)
[place])                                            [place] Team    01/25/2021  ONYINYE MORALIS 1940-08-07 [id number]  Received both patient and provider portion(s) of patient assistance application(s) for Ozempic. Faxed completed application and required documents into [employer].    Muslima Toppins P. Truong Delcastillo, [place]  [phone]

## 2021-01-28 ENCOUNTER — Telehealth: Payer: Self-pay | Admitting: Pharmacy Technician

## 2021-01-28 DIAGNOSIS — Z596 Low income: Secondary | ICD-10-CM

## 2021-01-28 NOTE — Progress Notes (Signed)
[facility])                                            [place] Team    01/28/2021  Nicole Montgomery Nov 12, 1940 [id number]  Care coordination call placed to [place] in regards to [place] application.  Spoke to Zena who informed patient was APPROVED 01/28/21-06/15/21 She informed a 120 days supply of medication would be delivered to the provider's office in the next 15 business days.  Nicole Montgomery P. Ardelia Wrede, [place]  [phone]

## 2021-01-31 DIAGNOSIS — E1159 Type 2 diabetes mellitus with other circulatory complications: Secondary | ICD-10-CM | POA: Diagnosis not present

## 2021-01-31 DIAGNOSIS — E1140 Type 2 diabetes mellitus with diabetic neuropathy, unspecified: Secondary | ICD-10-CM | POA: Diagnosis not present

## 2021-01-31 DIAGNOSIS — E782 Mixed hyperlipidemia: Secondary | ICD-10-CM | POA: Diagnosis not present

## 2021-02-08 DIAGNOSIS — E1159 Type 2 diabetes mellitus with other circulatory complications: Secondary | ICD-10-CM | POA: Diagnosis not present

## 2021-02-08 DIAGNOSIS — I152 Hypertension secondary to endocrine disorders: Secondary | ICD-10-CM | POA: Diagnosis not present

## 2021-02-08 DIAGNOSIS — E1140 Type 2 diabetes mellitus with diabetic neuropathy, unspecified: Secondary | ICD-10-CM | POA: Diagnosis not present

## 2021-02-08 DIAGNOSIS — N1831 Chronic kidney disease, stage 3a: Secondary | ICD-10-CM | POA: Diagnosis not present

## 2021-02-14 DIAGNOSIS — N183 Chronic kidney disease, stage 3 unspecified: Secondary | ICD-10-CM | POA: Diagnosis not present

## 2021-02-14 DIAGNOSIS — I129 Hypertensive chronic kidney disease with stage 1 through stage 4 chronic kidney disease, or unspecified chronic kidney disease: Secondary | ICD-10-CM | POA: Diagnosis not present

## 2021-02-14 DIAGNOSIS — E1140 Type 2 diabetes mellitus with diabetic neuropathy, unspecified: Secondary | ICD-10-CM | POA: Diagnosis not present

## 2021-03-16 DIAGNOSIS — E1140 Type 2 diabetes mellitus with diabetic neuropathy, unspecified: Secondary | ICD-10-CM | POA: Diagnosis not present

## 2021-03-16 DIAGNOSIS — N183 Chronic kidney disease, stage 3 unspecified: Secondary | ICD-10-CM | POA: Diagnosis not present

## 2021-03-16 DIAGNOSIS — I129 Hypertensive chronic kidney disease with stage 1 through stage 4 chronic kidney disease, or unspecified chronic kidney disease: Secondary | ICD-10-CM | POA: Diagnosis not present

## 2021-04-16 DIAGNOSIS — N183 Chronic kidney disease, stage 3 unspecified: Secondary | ICD-10-CM | POA: Diagnosis not present

## 2021-04-16 DIAGNOSIS — E1140 Type 2 diabetes mellitus with diabetic neuropathy, unspecified: Secondary | ICD-10-CM | POA: Diagnosis not present

## 2021-04-16 DIAGNOSIS — I129 Hypertensive chronic kidney disease with stage 1 through stage 4 chronic kidney disease, or unspecified chronic kidney disease: Secondary | ICD-10-CM | POA: Diagnosis not present

## 2021-04-24 ENCOUNTER — Telehealth: Payer: Self-pay | Admitting: Pharmacy Technician

## 2021-04-24 DIAGNOSIS — Z596 Low income: Secondary | ICD-10-CM

## 2021-04-24 NOTE — Progress Notes (Signed)
[facility])                                            [place] Team    04/24/2021  Nicole Montgomery May 15, 1941 [id number]  FOR 2023 RE ENROLLMENT                                      Medication Assistance Referral  Referral From: [place]  Medication/Company: [employer] / [employer] Patient application portion:  Education officer, museum portion: Faxed  to Dr. Marco Collie Provider address/fax verified via: Office website  [employer]. Nicole Montgomery, [name]  [phone]

## 2021-05-07 ENCOUNTER — Telehealth: Payer: Self-pay | Admitting: Pharmacy Technician

## 2021-05-07 DIAGNOSIS — Z596 Low income: Secondary | ICD-10-CM

## 2021-05-07 NOTE — Progress Notes (Signed)
[place] [facility])                                            [place] Team    05/07/2021  DAIELLE MELCHER [birth year] [id number]  Received both patient and providder portion(s) of patient assistance application(s) for Ozempic. Faxed completed application and required documents into [employer].    Sophiamarie Nease P. Leoda Smithhart, [place]  [phone]

## 2021-05-16 DIAGNOSIS — I129 Hypertensive chronic kidney disease with stage 1 through stage 4 chronic kidney disease, or unspecified chronic kidney disease: Secondary | ICD-10-CM | POA: Diagnosis not present

## 2021-05-16 DIAGNOSIS — E1140 Type 2 diabetes mellitus with diabetic neuropathy, unspecified: Secondary | ICD-10-CM | POA: Diagnosis not present

## 2021-05-16 DIAGNOSIS — N183 Chronic kidney disease, stage 3 unspecified: Secondary | ICD-10-CM | POA: Diagnosis not present

## 2021-07-05 ENCOUNTER — Telehealth: Payer: Self-pay | Admitting: Pharmacy Technician

## 2021-07-05 DIAGNOSIS — Z596 Low income: Secondary | ICD-10-CM

## 2021-07-05 NOTE — Progress Notes (Signed)
[place] [facility])                                            [place] Team    07/05/2021  Nicole Montgomery 10-20-1940 [id number]  Care coordination call placed to [place] in regard to [place] application.  Spoke to [place] who informs patient is APPROVED 06/16/21-06/15/22. Refills will auto process and ship to provider's office based on last refill in 2022 and going forward with delivery to the provider's office.  Malissa Slay P. Ajee Heasley, [place]  [phone]

## 2021-07-17 DIAGNOSIS — N183 Chronic kidney disease, stage 3 unspecified: Secondary | ICD-10-CM | POA: Diagnosis not present

## 2021-07-17 DIAGNOSIS — E1140 Type 2 diabetes mellitus with diabetic neuropathy, unspecified: Secondary | ICD-10-CM | POA: Diagnosis not present

## 2021-07-17 DIAGNOSIS — I129 Hypertensive chronic kidney disease with stage 1 through stage 4 chronic kidney disease, or unspecified chronic kidney disease: Secondary | ICD-10-CM | POA: Diagnosis not present

## 2021-09-14 DIAGNOSIS — E782 Mixed hyperlipidemia: Secondary | ICD-10-CM | POA: Diagnosis not present

## 2021-09-14 DIAGNOSIS — E1140 Type 2 diabetes mellitus with diabetic neuropathy, unspecified: Secondary | ICD-10-CM | POA: Diagnosis not present

## 2021-10-14 DIAGNOSIS — E1140 Type 2 diabetes mellitus with diabetic neuropathy, unspecified: Secondary | ICD-10-CM | POA: Diagnosis not present

## 2021-10-14 DIAGNOSIS — E782 Mixed hyperlipidemia: Secondary | ICD-10-CM | POA: Diagnosis not present

## 2021-10-14 DIAGNOSIS — E1159 Type 2 diabetes mellitus with other circulatory complications: Secondary | ICD-10-CM | POA: Diagnosis not present

## 2021-10-22 DIAGNOSIS — Z136 Encounter for screening for cardiovascular disorders: Secondary | ICD-10-CM | POA: Diagnosis not present

## 2021-10-22 DIAGNOSIS — E1159 Type 2 diabetes mellitus with other circulatory complications: Secondary | ICD-10-CM | POA: Diagnosis not present

## 2021-10-22 DIAGNOSIS — Z1389 Encounter for screening for other disorder: Secondary | ICD-10-CM | POA: Diagnosis not present

## 2021-10-22 DIAGNOSIS — Z0000 Encounter for general adult medical examination without abnormal findings: Secondary | ICD-10-CM | POA: Diagnosis not present

## 2021-10-22 DIAGNOSIS — Z139 Encounter for screening, unspecified: Secondary | ICD-10-CM | POA: Diagnosis not present

## 2021-10-22 DIAGNOSIS — I152 Hypertension secondary to endocrine disorders: Secondary | ICD-10-CM | POA: Diagnosis not present

## 2021-10-22 DIAGNOSIS — Z1331 Encounter for screening for depression: Secondary | ICD-10-CM | POA: Diagnosis not present

## 2021-10-22 DIAGNOSIS — Z0189 Encounter for other specified special examinations: Secondary | ICD-10-CM | POA: Diagnosis not present

## 2021-10-22 DIAGNOSIS — E1140 Type 2 diabetes mellitus with diabetic neuropathy, unspecified: Secondary | ICD-10-CM | POA: Diagnosis not present

## 2021-10-22 DIAGNOSIS — Z1339 Encounter for screening examination for other mental health and behavioral disorders: Secondary | ICD-10-CM | POA: Diagnosis not present

## 2021-10-22 DIAGNOSIS — N1831 Chronic kidney disease, stage 3a: Secondary | ICD-10-CM | POA: Diagnosis not present

## 2021-11-14 DIAGNOSIS — E782 Mixed hyperlipidemia: Secondary | ICD-10-CM | POA: Diagnosis not present

## 2021-11-14 DIAGNOSIS — E1140 Type 2 diabetes mellitus with diabetic neuropathy, unspecified: Secondary | ICD-10-CM | POA: Diagnosis not present

## 2021-12-14 DIAGNOSIS — E1140 Type 2 diabetes mellitus with diabetic neuropathy, unspecified: Secondary | ICD-10-CM | POA: Diagnosis not present

## 2021-12-14 DIAGNOSIS — E782 Mixed hyperlipidemia: Secondary | ICD-10-CM | POA: Diagnosis not present

## 2022-01-14 DIAGNOSIS — E1159 Type 2 diabetes mellitus with other circulatory complications: Secondary | ICD-10-CM | POA: Diagnosis not present

## 2022-01-14 DIAGNOSIS — I129 Hypertensive chronic kidney disease with stage 1 through stage 4 chronic kidney disease, or unspecified chronic kidney disease: Secondary | ICD-10-CM | POA: Diagnosis not present

## 2022-01-15 DIAGNOSIS — E1140 Type 2 diabetes mellitus with diabetic neuropathy, unspecified: Secondary | ICD-10-CM | POA: Diagnosis not present

## 2022-01-27 DIAGNOSIS — E1140 Type 2 diabetes mellitus with diabetic neuropathy, unspecified: Secondary | ICD-10-CM | POA: Diagnosis not present

## 2022-01-27 DIAGNOSIS — E1159 Type 2 diabetes mellitus with other circulatory complications: Secondary | ICD-10-CM | POA: Diagnosis not present

## 2022-01-27 DIAGNOSIS — E785 Hyperlipidemia, unspecified: Secondary | ICD-10-CM | POA: Diagnosis not present

## 2022-02-03 DIAGNOSIS — E1159 Type 2 diabetes mellitus with other circulatory complications: Secondary | ICD-10-CM | POA: Diagnosis not present

## 2022-02-03 DIAGNOSIS — I152 Hypertension secondary to endocrine disorders: Secondary | ICD-10-CM | POA: Diagnosis not present

## 2022-02-03 DIAGNOSIS — E782 Mixed hyperlipidemia: Secondary | ICD-10-CM | POA: Diagnosis not present

## 2022-02-03 DIAGNOSIS — F419 Anxiety disorder, unspecified: Secondary | ICD-10-CM | POA: Diagnosis not present

## 2022-02-03 DIAGNOSIS — E1140 Type 2 diabetes mellitus with diabetic neuropathy, unspecified: Secondary | ICD-10-CM | POA: Diagnosis not present

## 2022-02-03 DIAGNOSIS — Z6822 Body mass index (BMI) 22.0-22.9, adult: Secondary | ICD-10-CM | POA: Diagnosis not present

## 2022-02-03 DIAGNOSIS — Z23 Encounter for immunization: Secondary | ICD-10-CM | POA: Diagnosis not present

## 2022-02-14 DIAGNOSIS — I129 Hypertensive chronic kidney disease with stage 1 through stage 4 chronic kidney disease, or unspecified chronic kidney disease: Secondary | ICD-10-CM | POA: Diagnosis not present

## 2022-02-14 DIAGNOSIS — E1140 Type 2 diabetes mellitus with diabetic neuropathy, unspecified: Secondary | ICD-10-CM | POA: Diagnosis not present

## 2022-03-16 DIAGNOSIS — E1140 Type 2 diabetes mellitus with diabetic neuropathy, unspecified: Secondary | ICD-10-CM | POA: Diagnosis not present

## 2022-03-16 DIAGNOSIS — I129 Hypertensive chronic kidney disease with stage 1 through stage 4 chronic kidney disease, or unspecified chronic kidney disease: Secondary | ICD-10-CM | POA: Diagnosis not present

## 2022-03-25 ENCOUNTER — Telehealth: Payer: Self-pay | Admitting: Pharmacy Technician

## 2022-03-25 DIAGNOSIS — Z596 Low income: Secondary | ICD-10-CM

## 2022-03-25 NOTE — Progress Notes (Signed)
[place] [facility])                                            [place] Team    03/25/2022  PAKOU RAINBOW February 23, 1941 [id number]                                      Medication Assistance Referral-FOR 2024 RE ENROLLMENT  Referral From: [place]  Medication/Company: [employer] / [employer] Patient application portion:  Education officer, museum portion: Faxed  to Dr. Marco Collie Provider address/fax verified via: Office website    [employer]. Charnetta Wulff, [place]  [phone]

## 2022-04-30 ENCOUNTER — Telehealth: Payer: Self-pay | Admitting: Pharmacy Technician

## 2022-04-30 DIAGNOSIS — Z596 Low income: Secondary | ICD-10-CM

## 2022-04-30 NOTE — Progress Notes (Signed)
[facility])                                            [place] Team    04/30/2022  [name] 1941/02/05 [id number]  Received both patient and provider portion(s) of patient assistance application(s) for Ozempic. Faxed completed application and required documents into [employer].    Nicole Montgomery, [place]  [phone]

## 2022-06-16 DIAGNOSIS — E1140 Type 2 diabetes mellitus with diabetic neuropathy, unspecified: Secondary | ICD-10-CM | POA: Diagnosis not present

## 2022-06-16 DIAGNOSIS — E782 Mixed hyperlipidemia: Secondary | ICD-10-CM | POA: Diagnosis not present

## 2022-06-17 ENCOUNTER — Emergency Department (HOSPITAL_COMMUNITY): Payer: PPO

## 2022-06-17 ENCOUNTER — Inpatient Hospital Stay (HOSPITAL_COMMUNITY)
Admission: EM | Admit: 2022-06-17 | Discharge: 2022-07-04 | DRG: 637 | Disposition: A | Discharge status: Skilled Nursing Facility | Payer: PPO | Attending: Internal Medicine | Admitting: Internal Medicine

## 2022-06-17 DIAGNOSIS — J159 Unspecified bacterial pneumonia: Secondary | ICD-10-CM | POA: Diagnosis not present

## 2022-06-17 DIAGNOSIS — J9612 Chronic respiratory failure with hypercapnia: Secondary | ICD-10-CM | POA: Diagnosis not present

## 2022-06-17 DIAGNOSIS — J1001 Influenza due to other identified influenza virus with the same other identified influenza virus pneumonia: Secondary | ICD-10-CM | POA: Diagnosis not present

## 2022-06-17 DIAGNOSIS — G319 Degenerative disease of nervous system, unspecified: Secondary | ICD-10-CM | POA: Diagnosis not present

## 2022-06-17 DIAGNOSIS — E861 Hypovolemia: Secondary | ICD-10-CM | POA: Diagnosis not present

## 2022-06-17 DIAGNOSIS — G9341 Metabolic encephalopathy: Secondary | ICD-10-CM | POA: Diagnosis not present

## 2022-06-17 DIAGNOSIS — R339 Retention of urine, unspecified: Secondary | ICD-10-CM | POA: Diagnosis not present

## 2022-06-17 DIAGNOSIS — Z79899 Other long term (current) drug therapy: Secondary | ICD-10-CM | POA: Diagnosis not present

## 2022-06-17 DIAGNOSIS — E162 Hypoglycemia, unspecified: Secondary | ICD-10-CM | POA: Diagnosis not present

## 2022-06-17 DIAGNOSIS — R112 Nausea with vomiting, unspecified: Secondary | ICD-10-CM | POA: Diagnosis not present

## 2022-06-17 DIAGNOSIS — J189 Pneumonia, unspecified organism: Secondary | ICD-10-CM

## 2022-06-17 DIAGNOSIS — D649 Anemia, unspecified: Secondary | ICD-10-CM | POA: Diagnosis not present

## 2022-06-17 DIAGNOSIS — E875 Hyperkalemia: Secondary | ICD-10-CM | POA: Diagnosis not present

## 2022-06-17 DIAGNOSIS — R1310 Dysphagia, unspecified: Secondary | ICD-10-CM | POA: Diagnosis not present

## 2022-06-17 DIAGNOSIS — R000 Tachycardia, unspecified: Secondary | ICD-10-CM | POA: Diagnosis not present

## 2022-06-17 DIAGNOSIS — W1830XA Fall on same level, unspecified, initial encounter: Secondary | ICD-10-CM | POA: Diagnosis not present

## 2022-06-17 DIAGNOSIS — R29818 Other symptoms and signs involving the nervous system: Secondary | ICD-10-CM | POA: Diagnosis not present

## 2022-06-17 DIAGNOSIS — R52 Pain, unspecified: Secondary | ICD-10-CM | POA: Diagnosis not present

## 2022-06-17 DIAGNOSIS — J9601 Acute respiratory failure with hypoxia: Secondary | ICD-10-CM | POA: Diagnosis not present

## 2022-06-17 DIAGNOSIS — M792 Neuralgia and neuritis, unspecified: Secondary | ICD-10-CM | POA: Diagnosis not present

## 2022-06-17 DIAGNOSIS — R531 Weakness: Secondary | ICD-10-CM | POA: Diagnosis not present

## 2022-06-17 DIAGNOSIS — I10 Essential (primary) hypertension: Secondary | ICD-10-CM | POA: Diagnosis not present

## 2022-06-17 DIAGNOSIS — E11649 Type 2 diabetes mellitus with hypoglycemia without coma: Secondary | ICD-10-CM | POA: Diagnosis not present

## 2022-06-17 DIAGNOSIS — J9690 Respiratory failure, unspecified, unspecified whether with hypoxia or hypercapnia: Secondary | ICD-10-CM | POA: Diagnosis not present

## 2022-06-17 DIAGNOSIS — J101 Influenza due to other identified influenza virus with other respiratory manifestations: Secondary | ICD-10-CM | POA: Diagnosis not present

## 2022-06-17 DIAGNOSIS — R54 Age-related physical debility: Secondary | ICD-10-CM | POA: Diagnosis not present

## 2022-06-17 DIAGNOSIS — R4182 Altered mental status, unspecified: Secondary | ICD-10-CM | POA: Diagnosis not present

## 2022-06-17 DIAGNOSIS — E870 Hyperosmolality and hypernatremia: Secondary | ICD-10-CM | POA: Diagnosis present

## 2022-06-17 DIAGNOSIS — Z9911 Dependence on respirator [ventilator] status: Secondary | ICD-10-CM

## 2022-06-17 DIAGNOSIS — E869 Volume depletion, unspecified: Secondary | ICD-10-CM | POA: Diagnosis present

## 2022-06-17 DIAGNOSIS — J1008 Influenza due to other identified influenza virus with other specified pneumonia: Secondary | ICD-10-CM | POA: Diagnosis present

## 2022-06-17 DIAGNOSIS — Z4682 Encounter for fitting and adjustment of non-vascular catheter: Secondary | ICD-10-CM | POA: Diagnosis not present

## 2022-06-17 DIAGNOSIS — K6389 Other specified diseases of intestine: Secondary | ICD-10-CM | POA: Diagnosis not present

## 2022-06-17 DIAGNOSIS — L89893 Pressure ulcer of other site, stage 3: Secondary | ICD-10-CM | POA: Diagnosis not present

## 2022-06-17 DIAGNOSIS — Z8507 Personal history of malignant neoplasm of pancreas: Secondary | ICD-10-CM

## 2022-06-17 DIAGNOSIS — L8990 Pressure ulcer of unspecified site, unspecified stage: Secondary | ICD-10-CM | POA: Insufficient documentation

## 2022-06-17 DIAGNOSIS — E440 Moderate protein-calorie malnutrition: Secondary | ICD-10-CM | POA: Insufficient documentation

## 2022-06-17 DIAGNOSIS — E876 Hypokalemia: Secondary | ICD-10-CM | POA: Diagnosis not present

## 2022-06-17 DIAGNOSIS — Z90411 Acquired partial absence of pancreas: Secondary | ICD-10-CM

## 2022-06-17 DIAGNOSIS — L89812 Pressure ulcer of head, stage 2: Secondary | ICD-10-CM | POA: Diagnosis not present

## 2022-06-17 DIAGNOSIS — J168 Pneumonia due to other specified infectious organisms: Secondary | ICD-10-CM | POA: Diagnosis not present

## 2022-06-17 DIAGNOSIS — G500 Trigeminal neuralgia: Secondary | ICD-10-CM | POA: Diagnosis not present

## 2022-06-17 DIAGNOSIS — I6782 Cerebral ischemia: Secondary | ICD-10-CM | POA: Diagnosis not present

## 2022-06-17 DIAGNOSIS — R4781 Slurred speech: Secondary | ICD-10-CM | POA: Diagnosis present

## 2022-06-17 DIAGNOSIS — E1011 Type 1 diabetes mellitus with ketoacidosis with coma: Secondary | ICD-10-CM | POA: Diagnosis not present

## 2022-06-17 DIAGNOSIS — R0603 Acute respiratory distress: Secondary | ICD-10-CM | POA: Diagnosis not present

## 2022-06-17 DIAGNOSIS — E1110 Type 2 diabetes mellitus with ketoacidosis without coma: Secondary | ICD-10-CM | POA: Diagnosis not present

## 2022-06-17 DIAGNOSIS — Z7401 Bed confinement status: Secondary | ICD-10-CM | POA: Diagnosis not present

## 2022-06-17 DIAGNOSIS — R1312 Dysphagia, oropharyngeal phase: Secondary | ICD-10-CM | POA: Diagnosis present

## 2022-06-17 DIAGNOSIS — R0602 Shortness of breath: Secondary | ICD-10-CM | POA: Diagnosis not present

## 2022-06-17 DIAGNOSIS — Z9081 Acquired absence of spleen: Secondary | ICD-10-CM

## 2022-06-17 DIAGNOSIS — N179 Acute kidney failure, unspecified: Secondary | ICD-10-CM | POA: Diagnosis present

## 2022-06-17 DIAGNOSIS — C259 Malignant neoplasm of pancreas, unspecified: Secondary | ICD-10-CM | POA: Diagnosis not present

## 2022-06-17 DIAGNOSIS — Z885 Allergy status to narcotic agent status: Secondary | ICD-10-CM

## 2022-06-17 DIAGNOSIS — G9340 Encephalopathy, unspecified: Secondary | ICD-10-CM

## 2022-06-17 DIAGNOSIS — Z1152 Encounter for screening for COVID-19: Secondary | ICD-10-CM | POA: Diagnosis not present

## 2022-06-17 DIAGNOSIS — L89891 Pressure ulcer of other site, stage 1: Secondary | ICD-10-CM | POA: Diagnosis present

## 2022-06-17 DIAGNOSIS — E8770 Fluid overload, unspecified: Secondary | ICD-10-CM | POA: Diagnosis not present

## 2022-06-17 DIAGNOSIS — J3489 Other specified disorders of nose and nasal sinuses: Secondary | ICD-10-CM | POA: Diagnosis not present

## 2022-06-17 DIAGNOSIS — K5900 Constipation, unspecified: Secondary | ICD-10-CM | POA: Diagnosis not present

## 2022-06-17 DIAGNOSIS — J9600 Acute respiratory failure, unspecified whether with hypoxia or hypercapnia: Secondary | ICD-10-CM | POA: Diagnosis not present

## 2022-06-17 DIAGNOSIS — R0902 Hypoxemia: Secondary | ICD-10-CM | POA: Diagnosis not present

## 2022-06-17 DIAGNOSIS — Z9181 History of falling: Secondary | ICD-10-CM | POA: Diagnosis not present

## 2022-06-17 DIAGNOSIS — E1165 Type 2 diabetes mellitus with hyperglycemia: Secondary | ICD-10-CM | POA: Diagnosis not present

## 2022-06-17 DIAGNOSIS — D75838 Other thrombocytosis: Secondary | ICD-10-CM | POA: Diagnosis not present

## 2022-06-17 DIAGNOSIS — I4510 Unspecified right bundle-branch block: Secondary | ICD-10-CM | POA: Diagnosis not present

## 2022-06-17 DIAGNOSIS — Z87442 Personal history of urinary calculi: Secondary | ICD-10-CM

## 2022-06-17 DIAGNOSIS — R9431 Abnormal electrocardiogram [ECG] [EKG]: Secondary | ICD-10-CM | POA: Diagnosis not present

## 2022-06-17 DIAGNOSIS — R739 Hyperglycemia, unspecified: Secondary | ICD-10-CM | POA: Diagnosis not present

## 2022-06-17 DIAGNOSIS — N3289 Other specified disorders of bladder: Secondary | ICD-10-CM | POA: Diagnosis not present

## 2022-06-17 HISTORY — DX: Type 2 diabetes mellitus without complications: E11.9

## 2022-06-17 HISTORY — DX: Essential (primary) hypertension: I10

## 2022-06-17 HISTORY — DX: Calculus of kidney: N20.0

## 2022-06-17 LAB — RESP PANEL BY RT-PCR (RSV, FLU A&B, COVID)  RVPGX2
Influenza A by PCR: POSITIVE — AB
Influenza B by PCR: NEGATIVE
Resp Syncytial Virus by PCR: NEGATIVE
SARS Coronavirus 2 by RT PCR: NEGATIVE

## 2022-06-17 LAB — CBC
HCT: 48 % — ABNORMAL HIGH (ref 36.0–46.0)
HCT: 39.8 % (ref 36.0–46.0)
Hemoglobin: 13.7 g/dL (ref 12.0–15.0)
Hemoglobin: 11.7 g/dL — ABNORMAL LOW (ref 12.0–15.0)
MCH: 30.6 pg (ref 26.0–34.0)
MCH: 29.8 pg (ref 26.0–34.0)
MCHC: 28.5 g/dL — ABNORMAL LOW (ref 30.0–36.0)
MCHC: 29.4 g/dL — ABNORMAL LOW (ref 30.0–36.0)
MCV: 107.4 fL — ABNORMAL HIGH (ref 80.0–100.0)
MCV: 101.5 fL — ABNORMAL HIGH (ref 80.0–100.0)
Platelets: 264 10*3/uL (ref 150–400)
Platelets: 234 10*3/uL (ref 150–400)
RBC: 4.47 MIL/uL (ref 3.87–5.11)
RBC: 3.92 MIL/uL (ref 3.87–5.11)
RDW: 15.5 % (ref 11.5–15.5)
RDW: 15 % (ref 11.5–15.5)
WBC: 11.3 10*3/uL — ABNORMAL HIGH (ref 4.0–10.5)
WBC: 9 10*3/uL (ref 4.0–10.5)
nRBC: 3.6 % — ABNORMAL HIGH (ref 0.0–0.2)
nRBC: 4.4 % — ABNORMAL HIGH (ref 0.0–0.2)

## 2022-06-17 LAB — ETHANOL: Alcohol, Ethyl (B): <10 mg/dL (ref <10)

## 2022-06-17 LAB — COMPREHENSIVE METABOLIC PANEL
ALT: 40 U/L (ref 0–44)
AST: 63 U/L — ABNORMAL HIGH (ref 15–41)
Albumin: 2.7 g/dL — ABNORMAL LOW (ref 3.5–5.0)
Alkaline Phosphatase: 128 U/L — ABNORMAL HIGH (ref 38–126)
Anion gap: 20 — ABNORMAL HIGH (ref 5–15)
BUN: 34 mg/dL — ABNORMAL HIGH (ref 8–23)
CO2: 12 mmol/L — ABNORMAL LOW (ref 22–32)
Calcium: 8.8 mg/dL — ABNORMAL LOW (ref 8.9–10.3)
Chloride: 96 mmol/L — ABNORMAL LOW (ref 98–111)
Creatinine, Ser: 1.57 mg/dL — ABNORMAL HIGH (ref 0.44–1.00)
GFR, Estimated: 33 mL/min — ABNORMAL LOW (ref >60)
Glucose, Bld: >1200 mg/dL (ref 70–99)
Potassium: 3.4 mmol/L — ABNORMAL LOW (ref 3.5–5.1)
Sodium: 128 mmol/L — ABNORMAL LOW (ref 135–145)
Total Bilirubin: 0.7 mg/dL (ref 0.3–1.2)
Total Protein: 6.8 g/dL (ref 6.5–8.1)

## 2022-06-17 LAB — DIFFERENTIAL
Abs Immature Granulocytes: 0 10*3/uL (ref 0.00–0.07)
Basophils Absolute: 0 10*3/uL (ref 0.0–0.1)
Basophils Relative: 0 %
Eosinophils Absolute: 0 10*3/uL (ref 0.0–0.5)
Eosinophils Relative: 0 %
Lymphocytes Relative: 0 %
Lymphs Abs: 0 10*3/uL — ABNORMAL LOW (ref 0.7–4.0)
Monocytes Absolute: 0.5 10*3/uL (ref 0.1–1.0)
Monocytes Relative: 4 %
Neutro Abs: 10.8 10*3/uL — ABNORMAL HIGH (ref 1.7–7.7)
Neutrophils Relative %: 96 %
nRBC: 4 /100 WBC — ABNORMAL HIGH

## 2022-06-17 LAB — URINALYSIS, ROUTINE W REFLEX MICROSCOPIC
Bilirubin Urine: NEGATIVE
Glucose, UA: >=500 mg/dL — AB
Hgb urine dipstick: NEGATIVE
Ketones, ur: 5 mg/dL — AB
Leukocytes,Ua: NEGATIVE
Nitrite: NEGATIVE
Protein, ur: NEGATIVE mg/dL
Specific Gravity, Urine: 1.029 (ref 1.005–1.030)
pH: 5 (ref 5.0–8.0)

## 2022-06-17 LAB — I-STAT CHEM 8, ED
BUN: 36 mg/dL — ABNORMAL HIGH (ref 8–23)
Calcium, Ion: 1.07 mmol/L — ABNORMAL LOW (ref 1.15–1.40)
Chloride: 100 mmol/L (ref 98–111)
Creatinine, Ser: 1.2 mg/dL — ABNORMAL HIGH (ref 0.44–1.00)
Glucose, Bld: >700 mg/dL (ref 70–99)
HCT: 47 % — ABNORMAL HIGH (ref 36.0–46.0)
Hemoglobin: 16 g/dL — ABNORMAL HIGH (ref 12.0–15.0)
Potassium: 3.4 mmol/L — ABNORMAL LOW (ref 3.5–5.1)
Sodium: 130 mmol/L — ABNORMAL LOW (ref 135–145)
TCO2: 15 mmol/L — ABNORMAL LOW (ref 22–32)

## 2022-06-17 LAB — I-STAT VENOUS BLOOD GAS, ED
Acid-base deficit: 10 mmol/L — ABNORMAL HIGH (ref 0.0–2.0)
Bicarbonate: 14.7 mmol/L — ABNORMAL LOW (ref 20.0–28.0)
Calcium, Ion: 1.04 mmol/L — ABNORMAL LOW (ref 1.15–1.40)
HCT: 47 % — ABNORMAL HIGH (ref 36.0–46.0)
Hemoglobin: 16 g/dL — ABNORMAL HIGH (ref 12.0–15.0)
O2 Saturation: 98 %
Potassium: 4.5 mmol/L (ref 3.5–5.1)
Sodium: 128 mmol/L — ABNORMAL LOW (ref 135–145)
TCO2: 16 mmol/L — ABNORMAL LOW (ref 22–32)
pCO2, Ven: 30.1 mmHg — ABNORMAL LOW (ref 44–60)
pH, Ven: 7.296 (ref 7.25–7.43)
pO2, Ven: 121 mmHg — ABNORMAL HIGH (ref 32–45)

## 2022-06-17 LAB — CBG MONITORING, ED
Glucose-Capillary: >600 mg/dL (ref 70–99)
Glucose-Capillary: >600 mg/dL (ref 70–99)
Glucose-Capillary: >600 mg/dL (ref 70–99)
Glucose-Capillary: >600 mg/dL (ref 70–99)

## 2022-06-17 LAB — RAPID URINE DRUG SCREEN, HOSP PERFORMED
Amphetamines: NOT DETECTED
Barbiturates: NOT DETECTED
Benzodiazepines: NOT DETECTED
Cocaine: NOT DETECTED
Opiates: NOT DETECTED
Tetrahydrocannabinol: NOT DETECTED

## 2022-06-17 LAB — PROTIME-INR
INR: 1.2 (ref 0.8–1.2)
Prothrombin Time: 15.3 seconds — ABNORMAL HIGH (ref 11.4–15.2)

## 2022-06-17 LAB — APTT: aPTT: 30 seconds (ref 24–36)

## 2022-06-17 LAB — LACTIC ACID, PLASMA: Lactic Acid, Venous: 6.3 mmol/L (ref 0.5–1.9)

## 2022-06-17 LAB — BETA-HYDROXYBUTYRIC ACID: Beta-Hydroxybutyric Acid: 1.64 mmol/L — ABNORMAL HIGH (ref 0.05–0.27)

## 2022-06-17 MED ORDER — ALBUTEROL SULFATE (2.5 MG/3ML) 0.083% IN NEBU
2.5000 mg | INHALATION_SOLUTION | RESPIRATORY_TRACT | Status: DC | PRN
  Administered 2022-06-18 – 2022-06-23 (×5): 2.5 mg via RESPIRATORY_TRACT
  Filled 2022-06-17 (×6): qty 3

## 2022-06-17 MED ORDER — LACTATED RINGERS IV SOLN: INTRAVENOUS | Status: DC

## 2022-06-17 MED ORDER — DEXMEDETOMIDINE HCL IN NACL 400 MCG/100ML IV SOLN
0.0000 ug/kg/h | INTRAVENOUS | Status: DC
  Administered 2022-06-17: 0.4 ug/kg/h via INTRAVENOUS
  Administered 2022-06-18: 0.7 ug/kg/h via INTRAVENOUS
  Administered 2022-06-19: 1 ug/kg/h via INTRAVENOUS
  Administered 2022-06-19: 0.6 ug/kg/h via INTRAVENOUS
  Administered 2022-06-19: 1.2 ug/kg/h via INTRAVENOUS
  Administered 2022-06-20: 0.8 ug/kg/h via INTRAVENOUS
  Filled 2022-06-17 (×7): qty 100

## 2022-06-17 MED ORDER — POTASSIUM CHLORIDE 10 MEQ/100ML IV SOLN
10.0000 meq | INTRAVENOUS | Status: AC
  Administered 2022-06-17 – 2022-06-18 (×4): 10 meq via INTRAVENOUS
  Filled 2022-06-17 (×4): qty 100

## 2022-06-17 MED ORDER — POLYETHYLENE GLYCOL 3350 17 G PO PACK: 17.0000 g | PACK | Freq: Every day | ORAL | Status: DC | PRN

## 2022-06-17 MED ORDER — DOCUSATE SODIUM 100 MG PO CAPS: 100.0000 mg | ORAL_CAPSULE | Freq: Two times a day (BID) | ORAL | Status: DC | PRN

## 2022-06-17 MED ORDER — HEPARIN SODIUM (PORCINE) 5000 UNIT/ML IJ SOLN
5000.0000 [IU] | Freq: Three times a day (TID) | INTRAMUSCULAR | Status: DC
  Administered 2022-06-17 – 2022-07-04 (×50): 5000 [IU] via SUBCUTANEOUS
  Filled 2022-06-17 (×50): qty 1

## 2022-06-17 MED ORDER — SODIUM CHLORIDE 0.9 % IV SOLN
500.0000 mg | Freq: Once | INTRAVENOUS | Status: AC
  Administered 2022-06-17: 500 mg via INTRAVENOUS
  Filled 2022-06-17: qty 5

## 2022-06-17 MED ORDER — OSELTAMIVIR PHOSPHATE 30 MG PO CAPS
30.0000 mg | ORAL_CAPSULE | Freq: Every day | ORAL | Status: DC
  Filled 2022-06-17 (×3): qty 1

## 2022-06-17 MED ORDER — SODIUM CHLORIDE 0.9 % IV SOLN
1.0000 g | Freq: Once | INTRAVENOUS | Status: AC
  Administered 2022-06-17: 1 g via INTRAVENOUS
  Filled 2022-06-17: qty 10

## 2022-06-17 MED ORDER — DEXTROSE IN LACTATED RINGERS 5 % IV SOLN: INTRAVENOUS | Status: DC

## 2022-06-17 MED ORDER — MIDAZOLAM HCL 2 MG/2ML IJ SOLN: 0.5000 mg | Freq: Once | INTRAMUSCULAR | Status: DC

## 2022-06-17 MED ORDER — HALOPERIDOL LACTATE 5 MG/ML IJ SOLN
2.0000 mg | Freq: Once | INTRAMUSCULAR | Status: AC
  Administered 2022-06-17: 2 mg via INTRAVENOUS
  Filled 2022-06-17: qty 1

## 2022-06-17 MED ORDER — OSELTAMIVIR PHOSPHATE 75 MG PO CAPS: 75.0000 mg | ORAL_CAPSULE | Freq: Every day | ORAL | Status: DC

## 2022-06-17 MED ORDER — SODIUM CHLORIDE 0.9 % IV SOLN
500.0000 mg | INTRAVENOUS | Status: DC
  Administered 2022-06-18 – 2022-06-22 (×5): 500 mg via INTRAVENOUS
  Filled 2022-06-17 (×6): qty 5

## 2022-06-17 MED ORDER — LACTATED RINGERS IV BOLUS
1000.0000 mL | Freq: Once | INTRAVENOUS | Status: AC
  Administered 2022-06-18: 1000 mL via INTRAVENOUS

## 2022-06-17 MED ORDER — SODIUM CHLORIDE 0.9 % IV SOLN
1.0000 g | INTRAVENOUS | Status: DC
  Administered 2022-06-18: 1 g via INTRAVENOUS
  Filled 2022-06-17: qty 10

## 2022-06-17 MED ORDER — LACTATED RINGERS IV BOLUS
1000.0000 mL | Freq: Once | INTRAVENOUS | Status: AC
  Administered 2022-06-17: 1000 mL via INTRAVENOUS

## 2022-06-17 MED ORDER — DEXTROSE 50 % IV SOLN: 0.0000 mL | INTRAVENOUS | Status: DC | PRN

## 2022-06-17 MED ORDER — INSULIN REGULAR(HUMAN) IN NACL 100-0.9 UT/100ML-% IV SOLN
INTRAVENOUS | Status: DC
  Administered 2022-06-17: 7 [IU]/h via INTRAVENOUS
  Administered 2022-06-18: 3.2 [IU]/h via INTRAVENOUS
  Filled 2022-06-17 (×2): qty 100

## 2022-06-17 NOTE — ED Notes (Signed)
Patient transported to CT 

## 2022-06-17 NOTE — H&P (Cosign Needed Addendum)
NAME:  Nicole Montgomery, MRN:  [id number], DOB:  February 08, 1941, LOS: 0 ADMISSION DATE:  06/17/2022, CONSULTATION DATE:  06/17/22 REFERRING MD:  EDP, CHIEF COMPLAINT:  AMS   History of Present Illness:  Nicole Montgomery is a 81 y.o. F with PMH significant for Type 2 DM on Ozempic, Trigeminal Neuralgia, HL who follows with PCP in [place] who presents with altered mental status.  Her husband is at the bedside and provides history-states that she began showing signs of confusion approximately one week ago.  She has had reduced appetite and barely been eating or drinking.  Her confusion worsened to the point she was not responsive on the toilet and so he called EMS who noted glucose >600.   He notes they cared for grandchildren last week who had the flu.  She also has been coughing and short of breath without fever, though had a fall from standing on 12/31.  She did not lose consciousness at that time and did not want to come to the hospital.   In the ED, she was hypertensive, tachycardic, hypoxic requiring [place] and agitated.  CTH was completed without acute findings, labs significant for glucose >1200, bicarb 12, gap 20, beta hydroxy 1.6, WBC 11.3, pH 7.9 and flu A+ with R-sided opacities on CXR.  Her husband states she may have missed her last ozempic injection secondary to her confusion.  PCCM consulted for admission given her AMS  Pertinent  Medical History   has no past medical history on file.   Significant Hospital Events: Including procedures, antibiotic start and stop dates in addition to other pertinent events   1/2 Presented with AMS, flu + and in DKA with significant agitation requiring precedex  Interim History / Subjective:  As above  Objective   Blood pressure (!) 166/95, pulse (!) 120, temperature 99.3 F (37.4 C), temperature source Rectal, resp. rate (!) 30, height 5' (1.524 m), weight 54.4 kg, SpO2 95 %.       No intake or output data in the 24 hours ending 06/17/22 2219 Filed Weights    06/17/22 2201  Weight: 54.4 kg    General:  thin elderly F agitated trying to get out of bed, acutely ill-appearing HEENT: MM pink/dry, facial abrasions Neuro: extremely agitated, not redirectable, trying to get out bed and pull lines, non-focal CV: s1s2 tachycardic, no m/r/g PULM:  on [place] with mild tachypnea and R-sided wheezing and rhonchi GI: soft, non-distended Extremities: warm/dry, no edema  Skin: abrasions to the upper extremities without active bleeding   Resolved Hospital Problem list     Assessment & Plan:    Type 2 DM with acute DKA Hypokalemia Acute Encephalopathy Glucose >1200 -insulin gtt and IVF per endotool, received 1L bolus will give another L -K repleted, monitor q4hr BMP, check Mag and phos -repeat beta hyroxy butyrate in the AM and check lactic acid -CTH negative, so agitated that it is impeding treatment, start Precedex and admit to ICU - MRI ordered in the ED, if mental status is improving can likely d/c     Acute Hypoxic Respiratory Failure secondary to Influenza A No hx asthma or tobacco use -continue [place] O2 to maintain sats >92% -start Tamiflu -continue empiric CAP coverage and check strep pneumo -prn nebs    AKI Secondary to volume depletion -monitor renal indices and UOP with resuscitation and avoid nephrotoxins    History of Tregeminal Neuralgia  -hold Neurontin for now      Best Practice (right click and "Reselect  all SmartList Selections" daily)   Diet/type: NPO DVT prophylaxis: prophylactic heparin  GI prophylaxis: N/A Lines: N/A Foley:  N/A Code Status:  full code Last date of multidisciplinary goals of care discussion [pending, husband updated at the bedside, confirms full code]  Labs   CBC: Recent Labs  Lab 06/17/22 2024 06/17/22 2035 06/17/22 2101  WBC 11.3*  --   --   NEUTROABS 10.8*  --   --   HGB 13.7 16.0* 16.0*  HCT 48.0* 47.0* 47.0*  MCV 107.4*  --   --   PLT 264  --   --     Basic Metabolic  Panel: Recent Labs  Lab 06/17/22 2024 06/17/22 2035 06/17/22 2101  NA 128* 130* 128*  K 3.4* 3.4* 4.5  CL 96* 100  --   CO2 12*  --   --   GLUCOSE >1,200* >700*  --   BUN 34* 36*  --   CREATININE 1.57* 1.20*  --   CALCIUM 8.8*  --   --    GFR: Estimated Creatinine Clearance: 26.4 mL/min (A) (by C-G formula based on SCr of 1.2 mg/dL (H)). Recent Labs  Lab 06/17/22 2024  WBC 11.3*    Liver Function Tests: Recent Labs  Lab 06/17/22 2024  AST 63*  ALT 40  ALKPHOS 128*  BILITOT 0.7  PROT 6.8  ALBUMIN 2.7*   No results for input(s): "LIPASE", "AMYLASE" in the last 168 hours. No results for input(s): "AMMONIA" in the last 168 hours.  ABG    Component Value Date/Time   HCO3 14.7 (L) 06/17/2022 2101   TCO2 16 (L) 06/17/2022 2101   ACIDBASEDEF 10.0 (H) 06/17/2022 2101   O2SAT 98 06/17/2022 2101     Coagulation Profile: Recent Labs  Lab 06/17/22 2024  INR 1.2    Cardiac Enzymes: No results for input(s): "CKTOTAL", "CKMB", "CKMBINDEX", "TROPONINI" in the last 168 hours.  HbA1C: No results found for: "HGBA1C"  CBG: Recent Labs  Lab 06/17/22 2144 06/17/22 2211  GLUCAP >600* >600*    Review of Systems:   Unable to obtain secondary to encephalopathy  Past Medical History:  She,  has no past medical history on file.   Surgical History:     Social History:   reports that she has never smoked. She has never used smokeless tobacco.   Family History:  Her family history is not on file.   Allergies Allergies  Allergen Reactions   Codeine Other (See Comments)    Pt states she gets really sweaty and light-headed, like she's going to pass out.     Home Medications  Prior to Admission medications   Medication Sig Start Date End Date Taking? Authorizing Provider  acetaminophen (TYLENOL) 650 MG CR tablet Take 1 tablet by mouth every 8 (eight) hours as needed.    [provider]  amitriptyline (ELAVIL) 50 MG tablet Take 1 tablet by mouth at  bedtime. 08/24/13   [provider]  aspirin 81 MG chewable tablet Chew 81 mg by mouth daily.     [provider]  celecoxib (CELEBREX) 100 MG capsule Take 100 mg by mouth daily.    [provider]  gabapentin (NEURONTIN) 300 MG capsule Take 300 mg by mouth 3 (three) times daily.  12/28/14   [provider]  ondansetron (ZOFRAN) 4 MG tablet Take 4 mg by mouth every 8 (eight) hours as needed.    [provider]  OZEMPIC, 0.25 OR 0.5 MG/DOSE, 2 MG/1.5ML SOPN Inject 0.5  mg into the skin once a week. 01/21/18   [provider]  polyethylene glycol powder (GLYCOLAX/MIRALAX) powder Take 17 g by mouth daily.    [provider]  pravastatin (PRAVACHOL) 80 MG tablet  01/13/17   [provider]  ranitidine (ZANTAC) 300 MG tablet Take 300 mg by mouth daily.  01/13/17   [provider]     Critical care time:  42 minutes    CRITICAL CARE Performed by: Otilio Carpen Tavi Gaughran   Total critical care time: 42 minutes  Critical care time was exclusive of separately billable procedures and treating other patients.  Critical care was necessary to treat or prevent imminent or life-threatening deterioration.  Critical care was time spent personally by me on the following activities: development of treatment plan with patient and/or surrogate as well as nursing, discussions with consultants, evaluation of patient's response to treatment, examination of patient, obtaining history from patient or surrogate, ordering and performing treatments and interventions, ordering and review of laboratory studies, ordering and review of radiographic studies, pulse oximetry and re-evaluation of patient's condition.   Otilio Carpen Drury Ardizzone, PA-C [facility] See Amion for pager If no response to pager , please call [phone] until 7pm After 7:00 pm call Elink  [phone]?[place]

## 2022-06-17 NOTE — ED Provider Notes (Signed)
[facility] EMERGENCY DEPARTMENT Provider Note   CSN: [id number] Arrival date & time: 06/17/22  2008     History  Chief Complaint  Patient presents with   Altered Mental Status   Hyperglycemia    Nicole Montgomery is a 82 y.o. female.   Altered Mental Status Hyperglycemia Associated symptoms: altered mental status   Patient presents for altered mental status.  She arrives via EMS from home.  History is provided to EMS by family member.  They report that she is conversant, alert, and oriented at baseline.  She has had confusion and altered mental status for the past 7 days.  She developed slurred speech 2 days ago.  EMS noted hypoxia on room air in the range of 88%.  She was 2 L of supplemental oxygen.  Vital signs were otherwise notable for tachycardia.  Patient was normotensive.  CBG read "high".  IV fluids were started.  Prior to arrival, patient had received 200 cc of a 1 L bag.  Per chart review, her medical history includes HTN, HLD, GERD, T2DM.  Slurred speech and confusion for 3 days. Fall 3 days ago which caused arm wounds. Taking lots of pain medicine. Short of breath for several weeks. Ozempic only for diabetes q week on Saturdays. Flu contacts at home. Hasn't walked in 3 days. Noticed L sided weakness.     Home Medications Prior to Admission medications   Medication Sig Start Date End Date Taking? Authorizing Provider  acetaminophen (TYLENOL) 650 MG CR tablet Take 1 tablet by mouth every 8 (eight) hours as needed.    [provider]  amitriptyline (ELAVIL) 50 MG tablet Take 1 tablet by mouth at bedtime. 08/24/13   [provider]  aspirin 81 MG chewable tablet Chew 81 mg by mouth daily.     [provider]  celecoxib (CELEBREX) 100 MG capsule Take 100 mg by mouth daily.    [provider]  gabapentin (NEURONTIN) 300 MG capsule Take 300 mg by mouth 3 (three) times daily.  12/28/14   [provider]  ondansetron  (ZOFRAN) 4 MG tablet Take 4 mg by mouth every 8 (eight) hours as needed.    [provider]  OZEMPIC, 0.25 OR 0.5 MG/DOSE, 2 MG/1.5ML SOPN Inject 0.5 mg into the skin once a week. 01/21/18   [provider]  polyethylene glycol powder (GLYCOLAX/MIRALAX) powder Take 17 g by mouth daily.    [provider]  pravastatin (PRAVACHOL) 80 MG tablet  01/13/17   [provider]  ranitidine (ZANTAC) 300 MG tablet Take 300 mg by mouth daily.  01/13/17   [provider]      Allergies    Codeine    Review of Systems   Review of Systems  Unable to perform ROS: Mental status change    Physical Exam Updated Vital Signs BP (!) 166/95   Pulse (!) 120   Temp 99.3 F (37.4 C) (Rectal)   Resp (!) 30   Ht 5' (1.524 m)   Wt 54.4 kg   SpO2 95%   BMI 23.44 kg/m  Physical Exam Vitals and nursing note reviewed.  Constitutional:      General: She is not in acute distress.    Appearance: She is well-developed and normal weight. She is ill-appearing. She is not toxic-appearing or diaphoretic.  HENT:     Head: Normocephalic and atraumatic.     Right Ear: External ear normal.     Left Ear: External  ear normal.     Nose: Nose normal.     Comments: Healing wounds to right side of face    Mouth/Throat:     Mouth: Mucous membranes are dry.  Eyes:     Conjunctiva/sclera: Conjunctivae normal.  Neck:     Comments: Lateral flexion of the neck to the right Cardiovascular:     Rate and Rhythm: Regular rhythm. Tachycardia present.     Heart sounds: No murmur heard. Pulmonary:     Effort: Pulmonary effort is normal. No respiratory distress.     Breath sounds: Normal breath sounds. No wheezing or rales.  Chest:     Chest wall: No tenderness.  Abdominal:     General: There is no distension.     Palpations: Abdomen is soft.     Tenderness: There is no abdominal tenderness.  Musculoskeletal:        General: No swelling or deformity.     Cervical back: Neck supple.  No tenderness.     Right lower leg: No edema.     Left lower leg: No edema.  Skin:    General: Skin is warm and dry.     Capillary Refill: Capillary refill takes less than 2 seconds.     Coloration: Skin is not jaundiced or pale.     Comments: Healing wounds to left forearm  Neurological:     Mental Status: She is alert.     GCS: GCS eye subscore is 4. GCS verbal subscore is 4. GCS motor subscore is 6.     Sensory: Sensory deficit (Left hemibody) present.     Motor: Weakness (Left hemibody) present.     Comments: Exam limited by patient's altered mental status  Psychiatric:        Mood and Affect: Mood normal.     ED Results / Procedures / Treatments   Labs (all labs ordered are listed, but only abnormal results are displayed) Labs Reviewed  RESP PANEL BY RT-PCR (RSV, FLU A&B, COVID)  RVPGX2 - Abnormal; Notable for the following components:      Result Value   Influenza A by PCR POSITIVE (*)    All other components within normal limits  PROTIME-INR - Abnormal; Notable for the following components:   Prothrombin Time 15.3 (*)    All other components within normal limits  CBC - Abnormal; Notable for the following components:   WBC 11.3 (*)    HCT 48.0 (*)    MCV 107.4 (*)    MCHC 28.5 (*)    nRBC 3.6 (*)    All other components within normal limits  DIFFERENTIAL - Abnormal; Notable for the following components:   Neutro Abs 10.8 (*)    Lymphs Abs 0.0 (*)    nRBC 4 (*)    All other components within normal limits  COMPREHENSIVE METABOLIC PANEL - Abnormal; Notable for the following components:   Sodium 128 (*)    Potassium 3.4 (*)    Chloride 96 (*)    CO2 12 (*)    Glucose, Bld >1,200 (*)    BUN 34 (*)    Creatinine, Ser 1.57 (*)    Calcium 8.8 (*)    Albumin 2.7 (*)    AST 63 (*)    Alkaline Phosphatase 128 (*)    GFR, Estimated 33 (*)    Anion gap 20 (*)    All other components within normal limits  URINALYSIS, ROUTINE W REFLEX MICROSCOPIC - Abnormal; Notable  for the following components:  Glucose, UA >=500 (*)    Ketones, ur 5 (*)    Bacteria, UA RARE (*)    All other components within normal limits  BETA-HYDROXYBUTYRIC ACID - Abnormal; Notable for the following components:   Beta-Hydroxybutyric Acid 1.64 (*)    All other components within normal limits  I-STAT CHEM 8, ED - Abnormal; Notable for the following components:   Sodium 130 (*)    Potassium 3.4 (*)    BUN 36 (*)    Creatinine, Ser 1.20 (*)    Glucose, Bld >700 (*)    Calcium, Ion 1.07 (*)    TCO2 15 (*)    Hemoglobin 16.0 (*)    HCT 47.0 (*)    All other components within normal limits  I-STAT VENOUS BLOOD GAS, ED - Abnormal; Notable for the following components:   pCO2, Ven 30.1 (*)    pO2, Ven 121 (*)    Bicarbonate 14.7 (*)    TCO2 16 (*)    Acid-base deficit 10.0 (*)    Sodium 128 (*)    Calcium, Ion 1.04 (*)    HCT 47.0 (*)    Hemoglobin 16.0 (*)    All other components within normal limits  CBG MONITORING, ED - Abnormal; Notable for the following components:   Glucose-Capillary >600 (*)    All other components within normal limits  CBG MONITORING, ED - Abnormal; Notable for the following components:   Glucose-Capillary >600 (*)    All other components within normal limits  ETHANOL  APTT  RAPID URINE DRUG SCREEN, HOSP PERFORMED  OSMOLALITY  PATHOLOGIST SMEAR REVIEW  BASIC METABOLIC PANEL  BASIC METABOLIC PANEL  BASIC METABOLIC PANEL  CBC  CBC  MAGNESIUM  PHOSPHORUS  MAGNESIUM  PHOSPHORUS  LACTIC ACID, PLASMA    EKG EKG Interpretation  Date/Time:  Tuesday June 17 2022 20:30:01 EST Ventricular Rate:  115 PR Interval:  121 QRS Duration: 137 QT Interval:  344 QTC Calculation: 476 R Axis:   50 Text Interpretation: Sinus tachycardia Probable left atrial enlargement Right bundle branch block Confirmed by Godfrey Pick [phone]) on 06/17/2022 8:40:39 PM  Radiology DG Abdomen 1 View  Result Date: 06/17/2022 CLINICAL DATA:  Altered mental status. EXAM:  ABDOMEN - 1 VIEW COMPARISON:  None Available. FINDINGS: Mild atelectasis and/or infiltrate is seen within the visualized portions of the bilateral lung bases. The bowel gas pattern is normal. Multiple radiopaque surgical coils are seen overlying the mid and upper abdomen. A distended urinary bladder is noted. No radio-opaque calculi or other significant radiographic abnormality are seen. IMPRESSION: Nonobstructive bowel gas pattern. Electronically Signed   By: Virgina Norfolk M.D.   On: 06/17/2022 21:27   DG Chest Portable 1 View  Result Date: 06/17/2022 CLINICAL DATA:  Hypoxia EXAM: PORTABLE CHEST 1 VIEW COMPARISON:  CT 11/12/2007, chest x-ray 10/08/2017 FINDINGS: Heterogeneous ground-glass opacity in the right mid and lower lung. Linear scarring or atelectasis in the left mid lung. Stable cardiomediastinal silhouette with aortic atherosclerosis. No pneumothorax. Moderate air distended bowel in the upper abdomen. IMPRESSION: Heterogeneous ground-glass opacity in the right mid and lower lung, suspicious for pneumonia. Moderate air distended bowel in the upper abdomen. This may be correlated with dedicated abdominal radiograph. Electronically Signed   By: Donavan Foil M.D.   On: 06/17/2022 21:08   CT HEAD WO CONTRAST  Result Date: 06/17/2022 CLINICAL DATA:  Neuro deficit, acute stroke suspected. Patient brought to ED after being found unconscious. EXAM: CT HEAD WITHOUT CONTRAST TECHNIQUE: Contiguous axial images were  obtained from the base of the skull through the vertex without intravenous contrast. RADIATION DOSE REDUCTION: This exam was performed according to the departmental dose-optimization program which includes automated exposure control, adjustment of the mA and/or kV according to patient size and/or use of iterative reconstruction technique. COMPARISON:  CT examination dated May 22, 2010 FINDINGS: Brain: No evidence of acute infarction, hemorrhage, hydrocephalus, extra-axial collection or mass  lesion/mass effect. Mild cerebral atrophy and chronic microvascular ischemic changes of the white matter. Vascular: No hyperdense vessel or unexpected calcification. Skull: Normal. Negative for fracture or focal lesion. Sinuses/Orbits: No acute finding. Other: None. IMPRESSION: 1. No acute intracranial abnormality. 2. Mild cerebral atrophy and chronic microvascular ischemic changes of the white matter. Electronically Signed   By: Keane Police D.O.   On: 06/17/2022 21:06    Procedures Procedures    Medications Ordered in ED Medications  insulin regular, human (MYXREDLIN) 100 units/ 100 mL infusion (7 Units/hr Intravenous New Bag/Given 06/17/22 2213)  lactated ringers infusion ( Intravenous New Bag/Given 06/17/22 2214)  dextrose 5 % in lactated ringers infusion (has no administration in time range)  dextrose 50 % solution 0-50 mL (has no administration in time range)  potassium chloride 10 mEq in 100 mL IVPB (10 mEq Intravenous New Bag/Given 06/17/22 2215)  azithromycin (ZITHROMAX) 500 mg in sodium chloride 0.9 % 250 mL IVPB (500 mg Intravenous New Bag/Given 06/17/22 2220)  docusate sodium (COLACE) capsule 100 mg (has no administration in time range)  polyethylene glycol (MIRALAX / GLYCOLAX) packet 17 g (has no administration in time range)  heparin injection 5,000 Units (has no administration in time range)  dexmedetomidine (PRECEDEX) 400 MCG/100ML (4 mcg/mL) infusion (has no administration in time range)  lactated ringers bolus 1,000 mL (1,000 mLs Intravenous New Bag/Given 06/17/22 2239)  cefTRIAXone (ROCEPHIN) 1 g in sodium chloride 0.9 % 100 mL IVPB (0 g Intravenous Stopped 06/17/22 2233)  haloperidol lactate (HALDOL) injection 2 mg (2 mg Intravenous Given 06/17/22 2145)    ED Course/ Medical Decision Making/ A&P                           Medical Decision Making Amount and/or Complexity of Data Reviewed Labs: ordered. Radiology: ordered.  Risk Prescription drug management. Decision regarding  hospitalization.   This patient presents to the ED for concern of altered mental status, this involves an extensive number of treatment options, and is a complaint that carries with it a high risk of complications and morbidity.  The differential diagnosis includes CVA, ICH, polypharmacy, medication withdrawal, DKA, HHS, sepsis, metabolic derangements   Co morbidities that complicate the patient evaluation  HTN, HLD, GERD, T2DM   Additional history obtained:  Additional history obtained from EMS, patient's husband External records from outside source obtained and reviewed including EMR   Lab Tests:  I Ordered, and personally interpreted labs.  The pertinent results include: Severe hyperglycemia with evidence of DKA; leukocytosis; pseudohyponatremia; slight hypokalemia; AKI   Imaging Studies ordered:  I ordered imaging studies including x-ray of chest and abdomen, CT head I independently visualized and interpreted imaging which showed right mid and lower lung opacities concerning for pneumonia; no acute findings on CT head I agree with the radiologist interpretation   Cardiac Monitoring: / EKG:  The patient was maintained on a cardiac monitor.  I personally viewed and interpreted the cardiac monitored which showed an underlying rhythm of: Sinus rhythm   Consultations Obtained:  I requested consultation with the  critical care,  and discussed lab and imaging findings as well as pertinent plan - they recommend: Admission to ICU   Problem List / ED Course / Critical interventions / Medication management  Patient presents for altered mental status.  She arrives from home via EMS.  EMS noted tachycardia, hyperglycemia, and hypoxia prior to arrival.  IV fluids were started.  On arrival, patient is GCS 13.  She does not appear to be moving her left hemibody.  She has hypoventilation consistent with likely acidosis.  Hyperglycemia in the range of 700 was confirmed on her arrival.   Patient to receive remainder of 1 L NS bag from EMS in addition to 1 L bolus of LR.  I spoke with her husband over the telephone.  He reports that she has had confusion and slurred speech for the past 3 days.  She also suffered a fall 3 days ago.  There is concern of CVA/or ICH.  Given the timeline, no code stroke was initiated.  Noncontrasted CT scan of head showed no acute findings.  Patient's lab work shows severe hyperglycemia with evidence of DKA.  Although osmolality was pending at time of admission, I suspect HHS component as well.  Continued IV fluids and insulin gtt. were initiated.  On chest x-ray, there is right middle and lower lobe opacities consistent with pneumonia.  Antibiotics were ordered.  Patient was kept on 2 L of supplemental oxygen.  She was found to be positive for influenza A.  Given the severity of your condition and severe abnormalities on lab work, critical care was consulted.  Patient was noted to ICU for further management. I ordered medication including IV fluids for dehydration; insulin gtt. and additional fluids for DKA/HHS; ceftriaxone and azithromycin for pneumonia; potassium chloride for hypokalemia; Haldol for agitation Reevaluation of the patient after these medicines showed that the patient improved I have reviewed the patients home medicines and have made adjustments as needed   Social Determinants of Health:  Lives at home with husband  CRITICAL CARE Performed by: Godfrey Pick   Total critical care time: 35 minutes  Critical care time was exclusive of separately billable procedures and treating other patients.  Critical care was necessary to treat or prevent imminent or life-threatening deterioration.  Critical care was time spent personally by me on the following activities: development of treatment plan with patient and/or surrogate as well as nursing, discussions with consultants, evaluation of patient's response to treatment, examination of patient,  obtaining history from patient or surrogate, ordering and performing treatments and interventions, ordering and review of laboratory studies, ordering and review of radiographic studies, pulse oximetry and re-evaluation of patient's condition.         Final Clinical Impression(s) / ED Diagnoses Final diagnoses:  Diabetic ketoacidosis without coma associated with type 2 diabetes mellitus ([place])  Pneumonia of right lung due to infectious organism, unspecified part of lung  Influenza A  Acute respiratory failure with hypoxia [facility])    Rx / DC Orders ED Discharge Orders     None         Godfrey Pick, MD 06/17/22 2240

## 2022-06-17 NOTE — ED Notes (Signed)
MD Olalere notified about critical lactic.

## 2022-06-17 NOTE — ED Triage Notes (Signed)
Pt BIB [facility] after pt's husband found pt "unconscious" on toilet. Husband stated she was altered for a week. EMS noticed pt to have slurred speech and AMS. Per EMS pt 86% on RA, improved to 95% on 2L [place]. CBG greater than 600. No thinners. Typically ambulatory and AO4.

## 2022-06-18 ENCOUNTER — Inpatient Hospital Stay: Payer: Self-pay

## 2022-06-18 ENCOUNTER — Inpatient Hospital Stay (HOSPITAL_COMMUNITY): Payer: PPO

## 2022-06-18 ENCOUNTER — Other Ambulatory Visit: Payer: Self-pay

## 2022-06-18 ENCOUNTER — Encounter (HOSPITAL_COMMUNITY): Payer: Self-pay | Admitting: Pulmonary Disease

## 2022-06-18 DIAGNOSIS — L8990 Pressure ulcer of unspecified site, unspecified stage: Secondary | ICD-10-CM | POA: Insufficient documentation

## 2022-06-18 DIAGNOSIS — R6520 Severe sepsis without septic shock: Secondary | ICD-10-CM

## 2022-06-18 DIAGNOSIS — J9601 Acute respiratory failure with hypoxia: Secondary | ICD-10-CM | POA: Diagnosis not present

## 2022-06-18 DIAGNOSIS — E0810 Diabetes mellitus due to underlying condition with ketoacidosis without coma: Secondary | ICD-10-CM

## 2022-06-18 DIAGNOSIS — Z9911 Dependence on respirator [ventilator] status: Secondary | ICD-10-CM

## 2022-06-18 DIAGNOSIS — A419 Sepsis, unspecified organism: Secondary | ICD-10-CM

## 2022-06-18 DIAGNOSIS — G9341 Metabolic encephalopathy: Secondary | ICD-10-CM

## 2022-06-18 LAB — CBC
HCT: 43.7 % (ref 36.0–46.0)
Hemoglobin: 13.7 g/dL (ref 12.0–15.0)
MCH: 30.7 pg (ref 26.0–34.0)
MCHC: 31.4 g/dL (ref 30.0–36.0)
MCV: 98 fL (ref 80.0–100.0)
Platelets: 274 10*3/uL (ref 150–400)
RBC: 4.46 MIL/uL (ref 3.87–5.11)
RDW: 14.7 % (ref 11.5–15.5)
WBC: 10.7 10*3/uL — ABNORMAL HIGH (ref 4.0–10.5)
nRBC: 4.2 % — ABNORMAL HIGH (ref 0.0–0.2)

## 2022-06-18 LAB — GLUCOSE, CAPILLARY
Glucose-Capillary: 103 mg/dL — ABNORMAL HIGH (ref 70–99)
Glucose-Capillary: 113 mg/dL — ABNORMAL HIGH (ref 70–99)
Glucose-Capillary: 148 mg/dL — ABNORMAL HIGH (ref 70–99)
Glucose-Capillary: 152 mg/dL — ABNORMAL HIGH (ref 70–99)
Glucose-Capillary: 181 mg/dL — ABNORMAL HIGH (ref 70–99)
Glucose-Capillary: 213 mg/dL — ABNORMAL HIGH (ref 70–99)
Glucose-Capillary: 294 mg/dL — ABNORMAL HIGH (ref 70–99)
Glucose-Capillary: 349 mg/dL — ABNORMAL HIGH (ref 70–99)
Glucose-Capillary: 397 mg/dL — ABNORMAL HIGH (ref 70–99)
Glucose-Capillary: 98 mg/dL (ref 70–99)

## 2022-06-18 LAB — BASIC METABOLIC PANEL
Anion gap: 15 (ref 5–15)
Anion gap: 11 (ref 5–15)
Anion gap: 11 (ref 5–15)
Anion gap: 7 (ref 5–15)
BUN: 29 mg/dL — ABNORMAL HIGH (ref 8–23)
BUN: 28 mg/dL — ABNORMAL HIGH (ref 8–23)
BUN: 32 mg/dL — ABNORMAL HIGH (ref 8–23)
BUN: 32 mg/dL — ABNORMAL HIGH (ref 8–23)
CO2: 17 mmol/L — ABNORMAL LOW (ref 22–32)
CO2: 26 mmol/L (ref 22–32)
CO2: 23 mmol/L (ref 22–32)
CO2: 24 mmol/L (ref 22–32)
Calcium: 9 mg/dL (ref 8.9–10.3)
Calcium: 8.9 mg/dL (ref 8.9–10.3)
Calcium: 8.6 mg/dL — ABNORMAL LOW (ref 8.9–10.3)
Calcium: 7.9 mg/dL — ABNORMAL LOW (ref 8.9–10.3)
Chloride: 107 mmol/L (ref 98–111)
Chloride: 107 mmol/L (ref 98–111)
Chloride: 107 mmol/L (ref 98–111)
Chloride: 111 mmol/L (ref 98–111)
Creatinine, Ser: 1.32 mg/dL — ABNORMAL HIGH (ref 0.44–1.00)
Creatinine, Ser: 1.23 mg/dL — ABNORMAL HIGH (ref 0.44–1.00)
Creatinine, Ser: 1.4 mg/dL — ABNORMAL HIGH (ref 0.44–1.00)
Creatinine, Ser: 1.47 mg/dL — ABNORMAL HIGH (ref 0.44–1.00)
GFR, Estimated: 41 mL/min — ABNORMAL LOW (ref >60)
GFR, Estimated: 44 mL/min — ABNORMAL LOW (ref >60)
GFR, Estimated: 38 mL/min — ABNORMAL LOW (ref >60)
GFR, Estimated: 36 mL/min — ABNORMAL LOW (ref >60)
Glucose, Bld: 710 mg/dL (ref 70–99)
Glucose, Bld: 298 mg/dL — ABNORMAL HIGH (ref 70–99)
Glucose, Bld: 177 mg/dL — ABNORMAL HIGH (ref 70–99)
Glucose, Bld: 396 mg/dL — ABNORMAL HIGH (ref 70–99)
Potassium: 2.8 mmol/L — ABNORMAL LOW (ref 3.5–5.1)
Potassium: 4.7 mmol/L (ref 3.5–5.1)
Potassium: 4.1 mmol/L (ref 3.5–5.1)
Potassium: 4.7 mmol/L (ref 3.5–5.1)
Sodium: 139 mmol/L (ref 135–145)
Sodium: 144 mmol/L (ref 135–145)
Sodium: 141 mmol/L (ref 135–145)
Sodium: 142 mmol/L (ref 135–145)

## 2022-06-18 LAB — CBG MONITORING, ED
Glucose-Capillary: 161 mg/dL — ABNORMAL HIGH (ref 70–99)
Glucose-Capillary: 186 mg/dL — ABNORMAL HIGH (ref 70–99)
Glucose-Capillary: 193 mg/dL — ABNORMAL HIGH (ref 70–99)
Glucose-Capillary: 195 mg/dL — ABNORMAL HIGH (ref 70–99)
Glucose-Capillary: 199 mg/dL — ABNORMAL HIGH (ref 70–99)
Glucose-Capillary: 201 mg/dL — ABNORMAL HIGH (ref 70–99)
Glucose-Capillary: 243 mg/dL — ABNORMAL HIGH (ref 70–99)
Glucose-Capillary: 326 mg/dL — ABNORMAL HIGH (ref 70–99)
Glucose-Capillary: 339 mg/dL — ABNORMAL HIGH (ref 70–99)
Glucose-Capillary: 439 mg/dL — ABNORMAL HIGH (ref 70–99)
Glucose-Capillary: 512 mg/dL (ref 70–99)
Glucose-Capillary: 542 mg/dL (ref 70–99)
Glucose-Capillary: 546 mg/dL (ref 70–99)
Glucose-Capillary: >600 mg/dL (ref 70–99)
Glucose-Capillary: >600 mg/dL (ref 70–99)

## 2022-06-18 LAB — I-STAT ARTERIAL BLOOD GAS, ED
Acid-base deficit: 7 mmol/L — ABNORMAL HIGH (ref 0.0–2.0)
Bicarbonate: 24.4 mmol/L (ref 20.0–28.0)
Calcium, Ion: 1.28 mmol/L (ref 1.15–1.40)
HCT: 35 % — ABNORMAL LOW (ref 36.0–46.0)
Hemoglobin: 11.9 g/dL — ABNORMAL LOW (ref 12.0–15.0)
O2 Saturation: 99 %
Patient temperature: 101.9
Potassium: 4 mmol/L (ref 3.5–5.1)
Sodium: 145 mmol/L (ref 135–145)
TCO2: 27 mmol/L (ref 22–32)
pCO2 arterial: 86.9 mmHg (ref 32–48)
pH, Arterial: 7.069 — CL (ref 7.35–7.45)
pO2, Arterial: 201 mmHg — ABNORMAL HIGH (ref 83–108)

## 2022-06-18 LAB — OSMOLALITY: Osmolality: 362 mOsm/kg (ref 275–295)

## 2022-06-18 LAB — MAGNESIUM: Magnesium: 2.3 mg/dL (ref 1.7–2.4)

## 2022-06-18 LAB — LACTIC ACID, PLASMA
Lactic Acid, Venous: 3.2 mmol/L (ref 0.5–1.9)
Lactic Acid, Venous: 3.9 mmol/L (ref 0.5–1.9)
Lactic Acid, Venous: 2.8 mmol/L (ref 0.5–1.9)
Lactic Acid, Venous: 1.9 mmol/L (ref 0.5–1.9)

## 2022-06-18 LAB — BETA-HYDROXYBUTYRIC ACID: Beta-Hydroxybutyric Acid: 0.26 mmol/L (ref 0.05–0.27)

## 2022-06-18 LAB — MRSA NEXT GEN BY PCR, NASAL: MRSA by PCR Next Gen: NOT DETECTED

## 2022-06-18 LAB — PHOSPHORUS: Phosphorus: 3 mg/dL (ref 2.5–4.6)

## 2022-06-18 LAB — STREP PNEUMONIAE URINARY ANTIGEN: Strep Pneumo Urinary Antigen: NEGATIVE

## 2022-06-18 MED ORDER — DOCUSATE SODIUM 50 MG/5ML PO LIQD
100.0000 mg | Freq: Two times a day (BID) | ORAL | Status: DC
  Administered 2022-06-19 – 2022-06-24 (×11): 100 mg
  Filled 2022-06-18 (×12): qty 10

## 2022-06-18 MED ORDER — ORAL CARE MOUTH RINSE: 15.0000 mL | OROMUCOSAL | Status: DC | PRN

## 2022-06-18 MED ORDER — CHLORHEXIDINE GLUCONATE CLOTH 2 % EX PADS
6.0000 | MEDICATED_PAD | Freq: Every day | CUTANEOUS | Status: DC
  Administered 2022-06-18 – 2022-06-30 (×15): 6 via TOPICAL

## 2022-06-18 MED ORDER — MIDAZOLAM HCL 2 MG/2ML IJ SOLN
INTRAMUSCULAR | Status: AC
  Filled 2022-06-18: qty 2

## 2022-06-18 MED ORDER — FENTANYL 2500MCG IN NS 250ML (10MCG/ML) PREMIX INFUSION
25.0000 ug/h | INTRAVENOUS | Status: DC
  Administered 2022-06-18: 25 ug/h via INTRAVENOUS
  Administered 2022-06-19: 100 ug/h via INTRAVENOUS
  Administered 2022-06-20: 50 ug/h via INTRAVENOUS
  Administered 2022-06-24: 25 ug/h via INTRAVENOUS
  Administered 2022-06-25: 100 ug/h via INTRAVENOUS
  Filled 2022-06-18 (×6): qty 250

## 2022-06-18 MED ORDER — KETAMINE HCL 50 MG/5ML IJ SOSY
PREFILLED_SYRINGE | INTRAMUSCULAR | Status: AC
  Filled 2022-06-18: qty 10

## 2022-06-18 MED ORDER — FENTANYL CITRATE PF 50 MCG/ML IJ SOSY: 25.0000 ug | PREFILLED_SYRINGE | Freq: Once | INTRAMUSCULAR | Status: DC

## 2022-06-18 MED ORDER — ROCURONIUM BROMIDE 10 MG/ML (PF) SYRINGE
PREFILLED_SYRINGE | INTRAVENOUS | Status: AC
  Filled 2022-06-18: qty 10

## 2022-06-18 MED ORDER — FENTANYL CITRATE PF 50 MCG/ML IJ SOSY
PREFILLED_SYRINGE | INTRAMUSCULAR | Status: AC
  Filled 2022-06-18: qty 2

## 2022-06-18 MED ORDER — FENTANYL BOLUS VIA INFUSION
25.0000 ug | INTRAVENOUS | Status: DC | PRN
  Administered 2022-06-18: 50 ug via INTRAVENOUS
  Administered 2022-06-19: 100 ug via INTRAVENOUS
  Administered 2022-06-19: 50 ug via INTRAVENOUS
  Administered 2022-06-19 (×2): 100 ug via INTRAVENOUS
  Administered 2022-06-19 – 2022-06-20 (×2): 50 ug via INTRAVENOUS
  Administered 2022-06-20 (×3): 100 ug via INTRAVENOUS
  Administered 2022-06-24 (×4): 25 ug via INTRAVENOUS
  Administered 2022-06-25 (×4): 100 ug via INTRAVENOUS
  Administered 2022-06-26: 50 ug via INTRAVENOUS

## 2022-06-18 MED ORDER — MIDAZOLAM HCL 2 MG/2ML IJ SOLN
1.0000 mg | INTRAMUSCULAR | Status: DC | PRN
  Administered 2022-06-18: 2 mg via INTRAVENOUS
  Administered 2022-06-18: 1 mg via INTRAVENOUS
  Administered 2022-06-19 – 2022-06-25 (×4): 2 mg via INTRAVENOUS
  Filled 2022-06-18 (×7): qty 2

## 2022-06-18 MED ORDER — ORAL CARE MOUTH RINSE
15.0000 mL | OROMUCOSAL | Status: DC
  Administered 2022-06-18 – 2022-06-27 (×97): 15 mL via OROMUCOSAL

## 2022-06-18 MED ORDER — MIDAZOLAM HCL 5 MG/5ML IJ SOLN
INTRAMUSCULAR | Status: DC | PRN
  Administered 2022-06-18: 2 mg via INTRAVENOUS

## 2022-06-18 MED ORDER — ETOMIDATE 2 MG/ML IV SOLN
INTRAVENOUS | Status: AC
  Filled 2022-06-18: qty 20

## 2022-06-18 MED ORDER — FAMOTIDINE 20 MG PO TABS: 20.0000 mg | ORAL_TABLET | Freq: Every day | ORAL | Status: DC

## 2022-06-18 MED ORDER — ETOMIDATE 2 MG/ML IV SOLN
INTRAVENOUS | Status: DC | PRN
  Administered 2022-06-18: 10 mg via INTRAVENOUS

## 2022-06-18 MED ORDER — HALOPERIDOL LACTATE 5 MG/ML IJ SOLN
INTRAMUSCULAR | Status: AC
  Filled 2022-06-18: qty 1

## 2022-06-18 MED ORDER — POLYETHYLENE GLYCOL 3350 17 G PO PACK
17.0000 g | PACK | Freq: Every day | ORAL | Status: DC
  Administered 2022-06-20 – 2022-06-22 (×3): 17 g
  Filled 2022-06-18 (×3): qty 1

## 2022-06-18 MED ORDER — ACETAMINOPHEN 650 MG RE SUPP
650.0000 mg | Freq: Once | RECTAL | Status: AC
  Administered 2022-06-18: 650 mg via RECTAL
  Filled 2022-06-18: qty 1

## 2022-06-18 MED ORDER — ROCURONIUM BROMIDE 50 MG/5ML IV SOLN
INTRAVENOUS | Status: DC | PRN
  Administered 2022-06-18: 80 mg via INTRAVENOUS

## 2022-06-18 MED ORDER — ORAL CARE MOUTH RINSE
15.0000 mL | OROMUCOSAL | Status: DC
  Administered 2022-06-18 (×3): 15 mL via OROMUCOSAL

## 2022-06-18 MED ORDER — SUCCINYLCHOLINE CHLORIDE 200 MG/10ML IV SOSY
PREFILLED_SYRINGE | INTRAVENOUS | Status: AC
  Filled 2022-06-18: qty 10

## 2022-06-18 NOTE — Progress Notes (Signed)
RR 15 on vent increased to 26 per ABG. PCCM notified.   Latest Reference Range & Units 06/18/22 13:40  Sample type  ARTERIAL  pH, Arterial 7.35 - 7.45  7.069 (LL)  pCO2 arterial 32 - 48 mmHg 86.9 (HH)  pO2, Arterial 83 - 108 mmHg 201 (H)  TCO2 22 - 32 mmol/L 27  Acid-base deficit 0.0 - 2.0 mmol/L 7.0 (H)  Bicarbonate 20.0 - 28.0 mmol/L 24.4  O2 Saturation % 99  Patient temperature  101.9 F  (LL): Data is critically low [facility] Data is critically high (H): Data is abnormally high

## 2022-06-18 NOTE — Procedures (Signed)
Intubation Procedure Note Nicole Montgomery [id number] 25-Nov-1940  Procedure: Intubation Indications: Respiratory insufficiency  Procedure Details Consent: Risks of procedure as well as the alternatives and risks of each were explained to the (patient/caregiver).  Consent for procedure obtained. Time Out: Verified patient identification, verified procedure, site/side was marked, verified correct patient position, special equipment/implants available, medications/allergies/relevent history reviewed, required imaging and test results available.  Performed  MAC and 3 Medications:  Fentanyl  Etomidate 20 mg Versed 1 mg NMB rocuronium 80 mg    Evaluation Hemodynamic Status: BP stable throughout; O2 sats: stable throughout Patient's Current Condition: stable Complications: No apparent complications Patient did tolerate procedure well. Chest X-ray ordered to verify placement.  CXR: pending.   [name] Shakedra Beam ACNP Maryanna Shape PCCM Pager [phone] till 3 pm If no answer page [phone] 06/18/2022, 12:30 PM

## 2022-06-18 NOTE — Progress Notes (Signed)
NAME:  Nicole Montgomery, MRN:  [id number], DOB:  1941-03-12, LOS: 1 ADMISSION DATE:  06/17/2022, CONSULTATION DATE:  06/18/22 REFERRING MD:  EDP, CHIEF COMPLAINT:  AMS   History of Present Illness:  Nicole Montgomery is a 82 y.o. F with PMH significant for Type 2 DM on Ozempic, previous pancreatic adenocarcinoma, status post pancreatectomy, trigeminal Neuralgia, HL who follows with PCP in [place] who presents with altered mental status.  Her husband is at the bedside and provides history-states that she began showing signs of confusion approximately one week ago.  She has had reduced appetite and barely been eating or drinking.  Her confusion worsened to the point she was not responsive on the toilet and so he called EMS who noted glucose >600.   He notes they cared for grandchildren last week who had the flu.  She also has been coughing and short of breath without fever, though had a fall from standing on 12/31.  She did not lose consciousness at that time and did not want to come to the hospital.   In the ED, she was hypertensive, tachycardic, hypoxic requiring [place] and agitated.  CTH was completed without acute findings, labs significant for glucose >1200, bicarb 12, gap 20, beta hydroxy 1.6, WBC 11.3, pH 7.9 and flu A+ with R-sided opacities on CXR.  Her husband states she may have missed her last ozempic injection secondary to her confusion.  PCCM consulted for admission given her AMS  Pertinent  Medical History   has no past medical history on file.   Significant Hospital Events: Including procedures, antibiotic start and stop dates in addition to other pertinent events   1/2 presented with AMS, flu + and in DKA with significant agitation requiring precedex.  Started on antibiotic.    Interim History / Subjective:  Overnight remained on precedex. Tmax 101.9.  Increased oxygen requirements to 10 L-was placed on 10 L on a nonrebreather overnight.  Objective   Blood pressure 129/69, pulse (!) 104,  temperature (!) 101.9 F (38.8 C), temperature source Rectal, resp. rate (!) 26, height 5' (1.524 m), weight 54.4 kg, SpO2 95 %.        Intake/Output Summary (Last 24 hours) at 06/18/2022 0752 Last data filed at 06/18/2022 0629 Gross per 24 hour  Intake 2068.61 ml  Output --  Net 2068.61 ml   Filed Weights   06/17/22 2201  Weight: 54.4 kg    General: Ill-appearing woman lying in bed sedated on Precedex HEENT: Eyes anicteric, dry crusted lips with scabs Neuro: RASS -5 on precedexx CV: S1-S2, tachycardic PULM: Diffuse rales and rhonchi bilaterally, tachypnea, no accessory muscle use.  On Ventimask currently GI: Soft, nontender, nondistended Extremities: No clubbing or cyanosis, no significant edema Skin: Abrasions on her left arm, appears several days old.  No diffuse rashes.   BG down to 200s  K+ 2.8 Bicarb 17 BUN 29 Cr 1.32 LA 6.3 > 3.2  Resolved Hospital Problem list     Assessment & Plan:    Type 2 DM with acute DKA; has a history of pancreatectomy from pancreatic adenocarcinoma (2012) but no report of PTA insulin use.  Family not available to corroborate this history. Limited recent records in her chart. Hypokalemia, resolved -Continue insulin drip via Endo tool - Volume resuscitation with LR; overloaded at this time - Monitor electrolytes and replete as needed -PICC line for frequent blood draws and multiple meds  Acute Encephalopathy> seems prolonged for about a week prior to admission-likely multifactorial from  DKA, acute infection. -Decrease Precedex as able -Correct underlying metabolic abnormalities, treat infections - Try to avoid cefepime -MRI brain ordered  Acute hypoxic respiratory failure secondary to Influenza A -Supplemental oxygen as required to maintain SpO2 greater than 90% - Tamiflu - Empiric Antibiotics -High risk for progressing to intubation with altered mental status and severe hypoxia -Nebulizers as needed - NTS as needed.  Sepsis due  to flu with encephalopathy and AKI -Check blood cultures - Continue empiric antibiotics - Serial lactates improving For fevers  Lactic acidosis, improving - Continue fluids  pseudohyponatremia from hyperglycemia, resolved - Monitor electrolytes  AKI, imrproving  Secondary to volume depletion -monitor renal indices and UOP with resuscitation and avoid nephrotoxins  History of Tregeminal Neuralgia  -When mental status increased proved with decreased Precedex, need to start low-dose gabapentin to prevent withdrawal.  Moderate protein energy malnutrition - Place NG tube, core track has been requested -TF once out of DKA  Attempted to call her husband Juanda Crumble at the number listed in the chart.  I am very concerned about her overall clinical trajectory, especially with prolonged illness prior to hospitalization and advanced age.  I left a message requesting that he call back.  Discussed with ED RN.    Best Practice (right click and "Reselect all SmartList Selections" daily)   Diet/type: NPO DVT prophylaxis: prophylactic heparin  GI prophylaxis: N/A Lines: N/A Foley:  N/A Code Status:  full code Last date of multidisciplinary goals of care discussion [pending, husband updated at the bedside, confirms full code]  Labs   CBC: Recent Labs  Lab 06/17/22 2024 06/17/22 2035 06/17/22 2101 06/17/22 2232 06/18/22 0101  WBC 11.3*  --   --  9.0 10.7*  NEUTROABS 10.8*  --   --   --   --   HGB 13.7 16.0* 16.0* 11.7* 13.7  HCT 48.0* 47.0* 47.0* 39.8 43.7  MCV 107.4*  --   --  101.5* 98.0  PLT 264  --   --  234 274     Basic Metabolic Panel: Recent Labs  Lab 06/17/22 2024 06/17/22 2035 06/17/22 2101 06/18/22 0101  NA 128* 130* 128* 139  K 3.4* 3.4* 4.5 2.8*  CL 96* 100  --  107  CO2 12*  --   --  17*  GLUCOSE >1,200* >700*  --  710*  BUN 34* 36*  --  29*  CREATININE 1.57* 1.20*  --  1.32*  CALCIUM 8.8*  --   --  9.0  MG  --   --   --  2.3  PHOS  --   --   --  3.0     GFR: Estimated Creatinine Clearance: 24 mL/min (A) (by C-G formula based on SCr of 1.32 mg/dL (H)). Recent Labs  Lab 06/17/22 2024 06/17/22 2232 06/18/22 0101  WBC 11.3* 9.0 10.7*  LATICACIDVEN  --  6.3*  --       This patient is critically ill with multiple organ system failure which requires frequent high complexity decision making, assessment, support, evaluation, and titration of therapies. This was completed through the application of advanced monitoring technologies and extensive interpretation of multiple databases. During this encounter critical care time was devoted to patient care services described in this note for 45 minutes.  Julian Hy, DO 06/18/22 9:48 AM [facility]  For contact information, see Amion. If no response to pager, please call PCCM consult pager. After hours, 7PM- 7AM, please call Elink.

## 2022-06-18 NOTE — IPAL (Signed)
  Interdisciplinary Goals of Care Family Meeting   Date carried out:: 06/18/2022  Location of the meeting: Bedside  Member's involved: Physician, Bedside Registered Nurse, and Family Member or next of kin  Durable Power of Attorney or acting medical decision maker: husband Nicole Montgomery  Discussion: We discussed goals of care for Nicole Montgomery .  Nicole Montgomery is aware of how critically ill his wife is.  We discussed with her acute illness on top of extensive past medical history I worry about her prognosis.  She has progressed to needing mechanical ventilation to support her respiratory failure if she would continue wanting aggressive care.  He indicated she would not want prolonged life support, would be okay with short-term aggressive life support.  He wishes to proceed with intubation.  Her son is not at bedside but will be here this afternoon.  He requested that I also updated him later today regarding her care and prognosis.  Nicole Montgomery is the surrogate decision maker, and he has been married for 40 years.   Code status: Full Code  Disposition: Continue current acute care   Time spent for the meeting: 15 min.  Julian Hy 06/18/2022, 12:13 PM

## 2022-06-18 NOTE — Inpatient Diabetes Management (Signed)
Inpatient Diabetes Program Recommendations  AACE/ADA: New Consensus Statement on Inpatient Glycemic Control (2015)  Target Ranges:  Prepandial:   less than 140 mg/dL      Peak postprandial:   less than 180 mg/dL (1-2 hours)      Critically ill patients:  140 - 180 mg/dL   Lab Results  Component Value Date   GLUCAP 186 (H) 06/18/2022    Review of Glycemic Control  Latest Reference Range & Units 06/18/22 06:57 06/18/22 07:41 06/18/22 08:55 06/18/22 10:04  Glucose-Capillary 70 - 99 mg/dL 199 (H) 201 (H) 193 (H) 186 (H)   Diabetes history: DM 2 Outpatient Diabetes medications: Ozempic 0.5 mg weekly Current orders for Inpatient glycemic control:  IV insulin/Endotool  Inpatient Diabetes Program Recommendations:    IV insulin gtt 2.8 units/hour to keep glucose trends at current level.   -  consider Levemir 15 units bid -  Novolog 0-9 units Q4 hours unless diet ordered  Will follow pt closely and see when appropriate.  Thanks,  Tama Headings RN, MSN, BC-ADM Inpatient Diabetes Coordinator Team Pager [phone] (8a-5p)

## 2022-06-18 NOTE — ED Notes (Signed)
Called report to [place] at this time.

## 2022-06-18 NOTE — ED Notes (Signed)
Pt transported to ICU with RT and [facility]. All belongings on stretcher. Husband with pt.

## 2022-06-18 NOTE — ED Notes (Signed)
Attempted NG tube placement. Pt restless and pulling at lines and attempting to climb out of bed. Pt repositioned and Noemi Chapel DO aware.

## 2022-06-18 NOTE — ED Notes (Signed)
Pt continues attempting to remove self from monitor and is pulling at IV tubing. IV Haldol given by RN per EDP Dixon, no success. Two RNs at bedside at this time and attempting to deescalate pt. Critical care paged.

## 2022-06-18 NOTE — ED Notes (Signed)
Pt placed on NRB d/t pt's O2 88% on 6L [place]. This RN noticed pt to be mouth breathing. Pt now 100% on NRB

## 2022-06-18 NOTE — ED Notes (Addendum)
Noemi Chapel DO aware of Lactic Acid result of 3.2

## 2022-06-18 NOTE — ED Notes (Signed)
Pt restless sitting up. Trying to climb out of bed. Pt not redirectable py voice or to name. PT assisted back onto stretcher and soft wrist restraints applied.

## 2022-06-18 NOTE — ED Notes (Signed)
Pt 02 saturation 89%. Pt placed on 15L non-rebreather. [name] NP with critical care at bedside.

## 2022-06-18 NOTE — Progress Notes (Signed)
eLink Physician-Brief Progress Note Patient Name: Nicole Montgomery DOB: January 10, 1941 MRN: [id number]   Date of Service  06/18/2022  HPI/Events of Note  Multiple issues: 1. OGT repositioned several times today. 2. Request to review abdominal film for OGT placement. OGT tip overlies the proximal duodenum. 3. Nursing questioning if Brain MRI still needed. Nursing reports that mental status is not back to normal.   eICU Interventions  Plan: OK to use OGT. Restraint orders renewed X 12 hours. Proceed with MRI as ordered.      Intervention Category Major Interventions: Other:  Lysle Dingwall 06/18/2022, 8:35 PM

## 2022-06-18 NOTE — ED Notes (Signed)
Pt husband at bedside

## 2022-06-18 NOTE — Progress Notes (Signed)
Very agitated when Precedex is weaned down.  Desaturated despite nonrebreather and was removing IVs again.  Due to severe respiratory failure requiring heavy sedation to control her agitation, the safest plan is to proceed with intubation.  Family at bedside discussed-see are prominent.  I directly supervised RSI med administration and  intubation by NP Minor.  Additional cc time: 20 min.  Julian Hy, DO 06/18/22 12:17 PM [facility]

## 2022-06-18 NOTE — ED Notes (Addendum)
Patient noted to have nasal draining colored pink draining from one nostril. Patient unable to tolerate nasal cannula oxygen levels drop to 78% 6L. Patient placed on a non-re breather.    MD Olalere notified of changed in patient

## 2022-06-18 NOTE — Progress Notes (Signed)
ABG results are not downloading on the istat in ICU. The results are as follows on the following vent settings: PRVC, 360, 26, 70%, +5. CCM was made aware of ABG results.  Ph: 7.21, PCO2: 53.8, PO2: 130, HCO3: 22

## 2022-06-19 ENCOUNTER — Inpatient Hospital Stay (HOSPITAL_COMMUNITY): Payer: PPO

## 2022-06-19 DIAGNOSIS — E440 Moderate protein-calorie malnutrition: Secondary | ICD-10-CM | POA: Insufficient documentation

## 2022-06-19 DIAGNOSIS — J9601 Acute respiratory failure with hypoxia: Secondary | ICD-10-CM | POA: Diagnosis not present

## 2022-06-19 LAB — CBC WITH DIFFERENTIAL/PLATELET
Abs Immature Granulocytes: 1.29 10*3/uL — ABNORMAL HIGH (ref 0.00–0.07)
Basophils Absolute: 0 10*3/uL (ref 0.0–0.1)
Basophils Relative: 0 %
Eosinophils Absolute: 0 10*3/uL (ref 0.0–0.5)
Eosinophils Relative: 0 %
HCT: 36 % (ref 36.0–46.0)
Hemoglobin: 11.7 g/dL — ABNORMAL LOW (ref 12.0–15.0)
Immature Granulocytes: 6 %
Lymphocytes Relative: 7 %
Lymphs Abs: 1.6 10*3/uL (ref 0.7–4.0)
MCH: 30.3 pg (ref 26.0–34.0)
MCHC: 32.5 g/dL (ref 30.0–36.0)
MCV: 93.3 fL (ref 80.0–100.0)
Monocytes Absolute: 0.7 10*3/uL (ref 0.1–1.0)
Monocytes Relative: 3 %
Neutro Abs: 19.2 10*3/uL — ABNORMAL HIGH (ref 1.7–7.7)
Neutrophils Relative %: 84 %
Platelets: 208 10*3/uL (ref 150–400)
RBC: 3.86 MIL/uL — ABNORMAL LOW (ref 3.87–5.11)
RDW: 15 % (ref 11.5–15.5)
WBC: 22.8 10*3/uL — ABNORMAL HIGH (ref 4.0–10.5)
nRBC: 4 % — ABNORMAL HIGH (ref 0.0–0.2)

## 2022-06-19 LAB — GLUCOSE, CAPILLARY
Glucose-Capillary: 106 mg/dL — ABNORMAL HIGH (ref 70–99)
Glucose-Capillary: 115 mg/dL — ABNORMAL HIGH (ref 70–99)
Glucose-Capillary: 118 mg/dL — ABNORMAL HIGH (ref 70–99)
Glucose-Capillary: 118 mg/dL — ABNORMAL HIGH (ref 70–99)
Glucose-Capillary: 122 mg/dL — ABNORMAL HIGH (ref 70–99)
Glucose-Capillary: 141 mg/dL — ABNORMAL HIGH (ref 70–99)
Glucose-Capillary: 159 mg/dL — ABNORMAL HIGH (ref 70–99)
Glucose-Capillary: 163 mg/dL — ABNORMAL HIGH (ref 70–99)
Glucose-Capillary: 163 mg/dL — ABNORMAL HIGH (ref 70–99)
Glucose-Capillary: 196 mg/dL — ABNORMAL HIGH (ref 70–99)
Glucose-Capillary: 229 mg/dL — ABNORMAL HIGH (ref 70–99)
Glucose-Capillary: 239 mg/dL — ABNORMAL HIGH (ref 70–99)
Glucose-Capillary: 80 mg/dL (ref 70–99)
Glucose-Capillary: 84 mg/dL (ref 70–99)

## 2022-06-19 LAB — COMPREHENSIVE METABOLIC PANEL
ALT: 29 U/L (ref 0–44)
AST: 58 U/L — ABNORMAL HIGH (ref 15–41)
Albumin: 1.9 g/dL — ABNORMAL LOW (ref 3.5–5.0)
Alkaline Phosphatase: 122 U/L (ref 38–126)
Anion gap: 8 (ref 5–15)
BUN: 39 mg/dL — ABNORMAL HIGH (ref 8–23)
CO2: 21 mmol/L — ABNORMAL LOW (ref 22–32)
Calcium: 8.5 mg/dL — ABNORMAL LOW (ref 8.9–10.3)
Chloride: 114 mmol/L — ABNORMAL HIGH (ref 98–111)
Creatinine, Ser: 1.88 mg/dL — ABNORMAL HIGH (ref 0.44–1.00)
GFR, Estimated: 27 mL/min — ABNORMAL LOW (ref >60)
Glucose, Bld: 118 mg/dL — ABNORMAL HIGH (ref 70–99)
Potassium: 4.4 mmol/L (ref 3.5–5.1)
Sodium: 143 mmol/L (ref 135–145)
Total Bilirubin: 0.4 mg/dL (ref 0.3–1.2)
Total Protein: 5.1 g/dL — ABNORMAL LOW (ref 6.5–8.1)

## 2022-06-19 LAB — PATHOLOGIST SMEAR REVIEW

## 2022-06-19 LAB — PHOSPHORUS: Phosphorus: 1.5 mg/dL — ABNORMAL LOW (ref 2.5–4.6)

## 2022-06-19 LAB — MAGNESIUM: Magnesium: 2.1 mg/dL (ref 1.7–2.4)

## 2022-06-19 MED ORDER — THIAMINE MONONITRATE 100 MG PO TABS
100.0000 mg | ORAL_TABLET | Freq: Every day | ORAL | Status: AC
  Administered 2022-06-19 – 2022-06-23 (×5): 100 mg
  Filled 2022-06-19 (×5): qty 1

## 2022-06-19 MED ORDER — SODIUM CHLORIDE 0.9 % IV SOLN
2.0000 g | INTRAVENOUS | Status: DC
  Administered 2022-06-19 – 2022-06-22 (×4): 2 g via INTRAVENOUS
  Filled 2022-06-19 (×4): qty 20

## 2022-06-19 MED ORDER — MIDAZOLAM HCL 2 MG/2ML IJ SOLN: 2.0000 mg | Freq: Once | INTRAMUSCULAR | Status: AC

## 2022-06-19 MED ORDER — SODIUM PHOSPHATES 45 MMOLE/15ML IV SOLN
30.0000 mmol | Freq: Once | INTRAVENOUS | Status: AC
  Administered 2022-06-19: 30 mmol via INTRAVENOUS
  Filled 2022-06-19 (×2): qty 10

## 2022-06-19 MED ORDER — FAMOTIDINE 20 MG PO TABS
10.0000 mg | ORAL_TABLET | Freq: Every day | ORAL | Status: DC
  Administered 2022-06-19 – 2022-06-27 (×9): 10 mg
  Filled 2022-06-19 (×9): qty 1

## 2022-06-19 MED ORDER — OSELTAMIVIR PHOSPHATE 30 MG PO CAPS
30.0000 mg | ORAL_CAPSULE | Freq: Every day | ORAL | Status: AC
  Administered 2022-06-19 – 2022-06-21 (×3): 30 mg
  Filled 2022-06-19 (×3): qty 1

## 2022-06-19 MED ORDER — PROSOURCE TF20 ENFIT COMPATIBL EN LIQD
60.0000 mL | Freq: Every day | ENTERAL | Status: DC
  Administered 2022-06-19: 60 mL
  Filled 2022-06-19: qty 60

## 2022-06-19 MED ORDER — VITAL 1.5 CAL PO LIQD
1000.0000 mL | ORAL | Status: DC
  Administered 2022-06-19 – 2022-06-26 (×7): 1000 mL
  Filled 2022-06-19 (×6): qty 1000

## 2022-06-19 MED ORDER — VITAL HIGH PROTEIN PO LIQD: 1000.0000 mL | ORAL | Status: DC

## 2022-06-19 MED ORDER — DOCUSATE SODIUM 50 MG/5ML PO LIQD: 100.0000 mg | Freq: Two times a day (BID) | ORAL | Status: DC | PRN

## 2022-06-19 MED ORDER — ADULT MULTIVITAMIN W/MINERALS CH
1.0000 | ORAL_TABLET | Freq: Every day | ORAL | Status: DC
  Administered 2022-06-19 – 2022-06-30 (×12): 1
  Filled 2022-06-19 (×12): qty 1

## 2022-06-19 MED ORDER — INSULIN DETEMIR 100 UNIT/ML ~~LOC~~ SOLN
5.0000 [IU] | Freq: Two times a day (BID) | SUBCUTANEOUS | Status: DC
  Administered 2022-06-19 (×2): 5 [IU] via SUBCUTANEOUS
  Filled 2022-06-19 (×5): qty 0.05

## 2022-06-19 MED ORDER — INSULIN ASPART 100 UNIT/ML IJ SOLN
1.0000 [IU] | INTRAMUSCULAR | Status: DC
  Administered 2022-06-19: 3 [IU] via SUBCUTANEOUS
  Administered 2022-06-19: 2 [IU] via SUBCUTANEOUS
  Administered 2022-06-19 – 2022-06-20 (×3): 3 [IU] via SUBCUTANEOUS
  Administered 2022-06-20 (×2): 2 [IU] via SUBCUTANEOUS
  Administered 2022-06-20: 3 [IU] via SUBCUTANEOUS
  Administered 2022-06-20: 1 [IU] via SUBCUTANEOUS

## 2022-06-19 MED ORDER — INSULIN REGULAR(HUMAN) IN NACL 100-0.9 UT/100ML-% IV SOLN: INTRAVENOUS | Status: DC

## 2022-06-19 NOTE — Progress Notes (Signed)
Inpatient Diabetes Program Recommendations  AACE/ADA: New Consensus Statement on Inpatient Glycemic Control (2015)  Target Ranges:  Prepandial:   less than 140 mg/dL      Peak postprandial:   less than 180 mg/dL (1-2 hours)      Critically ill patients:  140 - 180 mg/dL   Lab Results  Component Value Date   GLUCAP 118 (H) 06/19/2022    Review of Glycemic Control  Latest Reference Range & Units 06/19/22 04:24 06/19/22 05:28 06/19/22 06:47 06/19/22 07:27  Glucose-Capillary 70 - 99 mg/dL 115 (H) 118 (H) 122 (H) 118 (H)   Diabetes history: DM 2 Outpatient Diabetes medications:  Ozempic 0.5 mg weekly Current orders for Inpatient glycemic control:  IV insulin  Inpatient Diabetes Program Recommendations:    Note patient transitioning to Levemir/Novolog today.  Will follow.   Thanks,  Adah Perl, RN, BC-ADM Inpatient Diabetes Coordinator Pager [phone]  (8a-5p)

## 2022-06-19 NOTE — Progress Notes (Addendum)
NAME:  MARDY HOPPE, MRN:  [id number], DOB:  1940/07/28, LOS: 2 ADMISSION DATE:  06/17/2022, CONSULTATION DATE:  06/19/22 REFERRING MD:  EDP, CHIEF COMPLAINT:  AMS   History of Present Illness:  Jaylon Grode is a 82 y.o. F with PMH significant for Type 2 DM on Ozempic, previous pancreatic adenocarcinoma, status post pancreatectomy, trigeminal Neuralgia, HL who follows with PCP in [place] who presents with altered mental status.  Her husband is at the bedside and provides history-states that she began showing signs of confusion approximately one week ago.  She has had reduced appetite and barely been eating or drinking.  Her confusion worsened to the point she was not responsive on the toilet and so he called EMS who noted glucose >600.   He notes they cared for grandchildren last week who had the flu.  She also has been coughing and short of breath without fever, though had a fall from standing on 12/31.  She did not lose consciousness at that time and did not want to come to the hospital.   In the ED, she was hypertensive, tachycardic, hypoxic requiring [place] and agitated.  CTH was completed without acute findings, labs significant for glucose >1200, bicarb 12, gap 20, beta hydroxy 1.6, WBC 11.3, pH 7.9 and flu A+ with R-sided opacities on CXR.  Her husband states she may have missed her last ozempic injection secondary to her confusion.  PCCM consulted for admission given her AMS  Pertinent  Medical History   has a past medical history of DM (diabetes mellitus) ([place]), H/O splenectomy (2012), HTN (hypertension), Nephrolithiasis, Pancreatic cancer ([place]) (2012), and Sarcoid (2012).   Significant Hospital Events: Including procedures, antibiotic start and stop dates in addition to other pertinent events   1/2 presented with AMS, flu + and in DKA with significant agitation requiring precedex.  Started on antibiotic.  1/3 intubated   Interim History / Subjective:  Intubated yesterday for sedation needs  and oxygen requirement. No acute events overnight.   Objective   Blood pressure 127/81, pulse 70, temperature 99.1 F (37.3 C), resp. rate (!) 30, height 5' (1.524 m), weight 59.6 kg, SpO2 99 %.    Vent Mode: PRVC FiO2 (%):  [50 %-100 %] 50 % Set Rate:  [15 bmp-30 bmp] 30 bmp Vt Set:  [360 mL] 360 mL PEEP:  [5 cmH20] 5 cmH20 Plateau Pressure:  [16 cmH20-21 cmH20] 17 cmH20   Intake/Output Summary (Last 24 hours) at 06/19/2022 0926 Last data filed at 06/19/2022 0900 Gross per 24 hour  Intake 4867.64 ml  Output 825 ml  Net 4042.64 ml    Filed Weights   06/17/22 2201 06/19/22 0500  Weight: 54.4 kg 59.6 kg    General: Elderly female resting comfortably on the vent.  HEENT: [place]/AT, PERRL, no JVD Neuro: RASS -4 CV: RRR, no MRG PULM: Clear bilateral breath sounds GI: Soft, NT, ND Extremities: No acute deformity. No clubbing or cyanosis.  Skin: Grossly intact.    BG down to low 100s.   K+ 4.4 Bicarb 21 GAP 8 BUN 39 Cr 1.88 LA 6.3 > 3.2 > 1.9  Resolved Hospital Problem list   Lactic acidosis  Assessment & Plan:    Type 2 DM with acute DKA; has a history of pancreatectomy from pancreatic adenocarcinoma (2012) but no report of PTA insulin use.  Family not available to corroborate this history. Limited recent records in her chart. Hypokalemia, resolved - Transition from insulin infusion to basal+ SSI - DC IVF once  insulin drip has stopped - Monitor electrolytes and replete as needed  Acute Encephalopathy> seems prolonged for about a week prior to admission-likely multifactorial from DKA, acute infection. MRI brain unremarkable. - Wean sedation as able for RASS -1.  - Precedex fentanyl - Daily SAT  Acute hypoxic respiratory failure secondary to Influenza A - Full vent support - Check ABG to assist with vent wean - Tamiflu - Empiric antibiotics continue - Daily SBT/SAT  Sepsis due to flu with encephalopathy and AKI - Check blood cultures - Continue empiric  antibiotics  AKI -monitor renal indices and UOP with resuscitation and avoid nephrotoxins  History of Tregeminal Neuralgia  -When mental status increased proved with decreased Precedex, need to start low-dose gabapentin to prevent withdrawal.  Moderate protein energy malnutrition - Place NG tube, core track has been requested - Start TF today   Best Practice (right click and "Reselect all SmartList Selections" daily)   Diet/type: NPO DVT prophylaxis: prophylactic heparin  GI prophylaxis: H2B Lines: N/A Foley:  N/A Code Status:  full code Last date of multidisciplinary goals of care discussion [pending, husband updated at the bedside, confirms full code] Son updated at bedside.   Labs   CBC: Recent Labs  Lab 06/17/22 2024 06/17/22 2035 06/17/22 2101 06/17/22 2232 06/18/22 0101 06/18/22 1340 06/19/22 0431  WBC 11.3*  --   --  9.0 10.7*  --  22.8*  NEUTROABS 10.8*  --   --   --   --   --  19.2*  HGB 13.7   < > 16.0* 11.7* 13.7 11.9* 11.7*  HCT 48.0*   < > 47.0* 39.8 43.7 35.0* 36.0  MCV 107.4*  --   --  101.5* 98.0  --  93.3  PLT 264  --   --  234 274  --  208   < > = values in this interval not displayed.     Basic Metabolic Panel: Recent Labs  Lab 06/18/22 0101 06/18/22 0745 06/18/22 1158 06/18/22 1340 06/18/22 1418 06/19/22 0431  NA 139 144 141 145 142 143  K 2.8* 4.7 4.1 4.0 4.7 4.4  CL 107 107 107  --  111 114*  CO2 17* 26 23  --  24 21*  GLUCOSE 710* 298* 177*  --  396* 118*  BUN 29* 28* 32*  --  32* 39*  CREATININE 1.32* 1.23* 1.40*  --  1.47* 1.88*  CALCIUM 9.0 8.9 8.6*  --  7.9* 8.5*  MG 2.3  --   --   --   --   --   PHOS 3.0  --   --   --   --   --     GFR: Estimated Creatinine Clearance: 18.9 mL/min (A) (by C-G formula based on SCr of 1.88 mg/dL (H)). Recent Labs  Lab 06/17/22 2024 06/17/22 2232 06/17/22 2232 06/18/22 0101 06/18/22 0819 06/18/22 1158 06/18/22 1845 06/18/22 2128 06/19/22 0431  WBC 11.3* 9.0  --  10.7*  --   --   --    --  22.8*  LATICACIDVEN  --  6.3*   < >  --  3.2* 3.9* 2.8* 1.9  --    < > = values in this interval not displayed.    Critical care time: 38 minutes  Georgann Housekeeper, AGACNP-BC [facility]  See Amion for personal pager PCCM on call pager [phone] until 7pm. Please call Elink 7p-7a. [phone]  06/19/2022 9:38 AM

## 2022-06-19 NOTE — Progress Notes (Signed)
eLink Physician-Brief Progress Note Patient Name: KATARINA RIEBE DOB: [birth year] MRN: [id number]   Date of Service  06/19/2022  HPI/Events of Note  Hypophosphatemia - PO4--- = 1.5 and Creatinine = 1.88.  eICU Interventions  Plan: Replace PO4---. Recheck phosphorus level at 5 AM.     Intervention Category Major Interventions: Electrolyte abnormality - evaluation and management  Rilee Knoll Eugene 06/19/2022, 8:30 PM

## 2022-06-19 NOTE — Progress Notes (Signed)
Initial Nutrition Assessment  DOCUMENTATION CODES:   Non-severe (moderate) malnutrition in context of chronic illness  INTERVENTION:   Initiate tube feeds via OG tube: - Start Vital 1.5 @ 20 ml/hr and advance by 10 ml q 8 hours to goal rate of 50 ml/hr (1200 ml/day)  Tube feeding regimen at goal rate provides 1800 kcal, 81 grams of protein, and 917 ml of H2O.   - MVI with minerals daily per tube  - Thiamine 100 mg daily per tube x 5 days due to refeeding risk  Monitor magnesium, potassium, and phosphorus BID for at least 3 days, MD to replete as needed, as pt is at risk for refeeding syndrome given malnutrition, minimal PO intake x 1 week PTA.  NUTRITION DIAGNOSIS:   Moderate Malnutrition related to chronic illness (pancreatic adenocarcinoma s/p pancreatectomy) as evidenced by mild fat depletion, moderate muscle depletion.  GOAL:   Patient will meet greater than or equal to 90% of their needs  MONITOR:   Vent status, Labs, Weight trends, TF tolerance, Skin  REASON FOR ASSESSMENT:   Ventilator, Consult Enteral/tube feeding initiation and management  ASSESSMENT:   82 year old female who presented to the ED on 1/02 after being found unresponsive on the toilet. PMH of T2DM, trigeminal neuralgia, previous pancreatic adenocarcinoma s/p pancreatectomy. Pt admitted with DKA, sepsis due to influenza A, AKI.  01/03 - intubated  Discussed pt with RN and during ICU rounds. Consult received for enteral nutrition initiation and management. Pt with OG tube in proximal duodenum per abdominal x-ray yesterday. Abdominal x-ray also showing air distended bowel in the upper abdomen.  Spoke with pt's son Nicole Kindred at bedside. He does not know how pt was eating or drinking PTA since he does not live in town. He does not think that pt has lost any weight recently. Per review of notes, pt with minimal PO intake for about 1 week PTA. Notes stated that pt "has had a reduced appetite and barely been  eating or drinking." RD to start tube feeds at trickle rate and slowly advance to goal as pt is at refeeding risk. RN aware. Will also order MVI with minerals and thiamine 100 mg daily x 5 days due to refeeding risk.  Patient is currently intubated on ventilator support MV: 10.7 L/min Temp (24hrs), Avg:98.3 F (36.8 C), Min:96.8 F (36 C), Max:99.1 F (37.3 C)  Drips: Fentanyl Precedex D5 in LR: 125 ml/hr  Medications reviewed and include: colace, pepcid, SSI q 4 hours, levemir 5 units q 12 hours, tamiflu, miralax, IV abx  Labs reviewed: BUN 39, creatinine 1.88, WBC 22.8 CBG's: 80-159 x 12 hours  UOP: 825 ml x 12 hours I/O's: +6.1 L since admit  NUTRITION - FOCUSED PHYSICAL EXAM:  Flowsheet Row Most Recent Value  Orbital Region Mild depletion  Upper Arm Region Mild depletion  Thoracic and Lumbar Region Moderate depletion  Buccal Region Unable to assess  Temple Region Moderate depletion  Clavicle Bone Region Moderate depletion  Clavicle and Acromion Bone Region Moderate depletion  Scapular Bone Region Unable to assess  Dorsal Hand Mild depletion  Patellar Region Mild depletion  Anterior Thigh Region Moderate depletion  Posterior Calf Region Mild depletion  Edema (RD Assessment) Mild  [BUE]  Hair Reviewed  Eyes Reviewed  Mouth Reviewed  Skin Reviewed  Nails Reviewed       Diet Order:   Diet Order             Diet NPO time specified  Diet effective now  EDUCATION NEEDS:   Not appropriate for education at this time  Skin:  Skin Assessment: Skin Integrity Issues: Stage I: L wrist Stage II: face Stage III: L arm x 2  Last BM:  no documented BM  Height:   Ht Readings from Last 1 Encounters:  06/17/22 5' (1.524 m)    Weight:   Wt Readings from Last 1 Encounters:  06/19/22 59.6 kg    BMI:  Body mass index is 25.66 kg/m.  Estimated Nutritional Needs:   Kcal:  6861-6837  Protein:  80-95 grams  Fluid:  1.7-1.9  L    Gustavus Bryant, MS, RD, LDN Inpatient Clinical Dietitian Please see AMiON for contact information.

## 2022-06-19 NOTE — Progress Notes (Signed)
Per Ronny Bacon RN, hold PICC placement for now. MD to re-evaluate for PICC necessity.

## 2022-06-20 DIAGNOSIS — E1011 Type 1 diabetes mellitus with ketoacidosis with coma: Secondary | ICD-10-CM | POA: Diagnosis not present

## 2022-06-20 DIAGNOSIS — N179 Acute kidney failure, unspecified: Secondary | ICD-10-CM | POA: Diagnosis not present

## 2022-06-20 DIAGNOSIS — J9601 Acute respiratory failure with hypoxia: Secondary | ICD-10-CM | POA: Diagnosis not present

## 2022-06-20 LAB — POCT I-STAT 7, (LYTES, BLD GAS, ICA,H+H)
Acid-base deficit: 6 mmol/L — ABNORMAL HIGH (ref 0.0–2.0)
Acid-base deficit: 3 mmol/L — ABNORMAL HIGH (ref 0.0–2.0)
Bicarbonate: 22 mmol/L (ref 20.0–28.0)
Bicarbonate: 22.7 mmol/L (ref 20.0–28.0)
Calcium, Ion: 1.3 mmol/L (ref 1.15–1.40)
Calcium, Ion: 1.32 mmol/L (ref 1.15–1.40)
HCT: 37 % (ref 36.0–46.0)
HCT: 36 % (ref 36.0–46.0)
Hemoglobin: 12.6 g/dL (ref 12.0–15.0)
Hemoglobin: 12.2 g/dL (ref 12.0–15.0)
O2 Saturation: 98 %
O2 Saturation: 97 %
Patient temperature: 99.1
Potassium: 3.9 mmol/L (ref 3.5–5.1)
Potassium: 4.2 mmol/L (ref 3.5–5.1)
Sodium: 145 mmol/L (ref 135–145)
Sodium: 146 mmol/L — ABNORMAL HIGH (ref 135–145)
TCO2: 24 mmol/L (ref 22–32)
TCO2: 24 mmol/L (ref 22–32)
pCO2 arterial: 53.8 mmHg — ABNORMAL HIGH (ref 32–48)
pCO2 arterial: 41.8 mmHg (ref 32–48)
pH, Arterial: 7.219 — ABNORMAL LOW (ref 7.35–7.45)
pH, Arterial: 7.343 — ABNORMAL LOW (ref 7.35–7.45)
pO2, Arterial: 130 mmHg — ABNORMAL HIGH (ref 83–108)
pO2, Arterial: 93 mmHg (ref 83–108)

## 2022-06-20 LAB — CBC
HCT: 34.5 % — ABNORMAL LOW (ref 36.0–46.0)
Hemoglobin: 11.9 g/dL — ABNORMAL LOW (ref 12.0–15.0)
MCH: 31 pg (ref 26.0–34.0)
MCHC: 34.5 g/dL (ref 30.0–36.0)
MCV: 89.8 fL (ref 80.0–100.0)
Platelets: 198 10*3/uL (ref 150–400)
RBC: 3.84 MIL/uL — ABNORMAL LOW (ref 3.87–5.11)
RDW: 15.5 % (ref 11.5–15.5)
WBC: 24.6 10*3/uL — ABNORMAL HIGH (ref 4.0–10.5)
nRBC: 6.6 % — ABNORMAL HIGH (ref 0.0–0.2)

## 2022-06-20 LAB — URINALYSIS, ROUTINE W REFLEX MICROSCOPIC
Bilirubin Urine: NEGATIVE
Glucose, UA: 150 mg/dL — AB
Hgb urine dipstick: NEGATIVE
Ketones, ur: NEGATIVE mg/dL
Nitrite: NEGATIVE
Protein, ur: NEGATIVE mg/dL
Specific Gravity, Urine: 1.015 (ref 1.005–1.030)
pH: 5 (ref 5.0–8.0)

## 2022-06-20 LAB — BASIC METABOLIC PANEL
Anion gap: 10 (ref 5–15)
BUN: 57 mg/dL — ABNORMAL HIGH (ref 8–23)
CO2: 20 mmol/L — ABNORMAL LOW (ref 22–32)
Calcium: 8.2 mg/dL — ABNORMAL LOW (ref 8.9–10.3)
Chloride: 115 mmol/L — ABNORMAL HIGH (ref 98–111)
Creatinine, Ser: 2.13 mg/dL — ABNORMAL HIGH (ref 0.44–1.00)
GFR, Estimated: 23 mL/min — ABNORMAL LOW (ref >60)
Glucose, Bld: 157 mg/dL — ABNORMAL HIGH (ref 70–99)
Potassium: 3.5 mmol/L (ref 3.5–5.1)
Sodium: 145 mmol/L (ref 135–145)

## 2022-06-20 LAB — PHOSPHORUS: Phosphorus: 3.8 mg/dL (ref 2.5–4.6)

## 2022-06-20 LAB — CREATININE, URINE, RANDOM: Creatinine, Urine: 77 mg/dL

## 2022-06-20 LAB — GLUCOSE, CAPILLARY
Glucose-Capillary: 134 mg/dL — ABNORMAL HIGH (ref 70–99)
Glucose-Capillary: 165 mg/dL — ABNORMAL HIGH (ref 70–99)
Glucose-Capillary: 200 mg/dL — ABNORMAL HIGH (ref 70–99)
Glucose-Capillary: 209 mg/dL — ABNORMAL HIGH (ref 70–99)
Glucose-Capillary: 213 mg/dL — ABNORMAL HIGH (ref 70–99)
Glucose-Capillary: 216 mg/dL — ABNORMAL HIGH (ref 70–99)

## 2022-06-20 LAB — MAGNESIUM: Magnesium: 1.8 mg/dL (ref 1.7–2.4)

## 2022-06-20 LAB — SODIUM, URINE, RANDOM: Sodium, Ur: 34 mmol/L

## 2022-06-20 MED ORDER — GABAPENTIN 600 MG PO TABS
300.0000 mg | ORAL_TABLET | Freq: Three times a day (TID) | ORAL | Status: DC
  Administered 2022-06-20: 300 mg via ORAL
  Filled 2022-06-20: qty 0.5

## 2022-06-20 MED ORDER — DULOXETINE HCL 60 MG PO CPEP
60.0000 mg | ORAL_CAPSULE | Freq: Every day | ORAL | Status: DC
  Administered 2022-06-20 – 2022-07-04 (×13): 60 mg via ORAL
  Filled 2022-06-20 (×14): qty 1

## 2022-06-20 MED ORDER — LACTATED RINGERS IV SOLN: INTRAVENOUS | Status: DC

## 2022-06-20 MED ORDER — AMITRIPTYLINE HCL 25 MG PO TABS
50.0000 mg | ORAL_TABLET | Freq: Every day | ORAL | Status: DC
  Administered 2022-06-20 – 2022-06-29 (×9): 50 mg
  Filled 2022-06-20 (×4): qty 1
  Filled 2022-06-20 (×2): qty 2
  Filled 2022-06-20 (×5): qty 1

## 2022-06-20 MED ORDER — PROPOFOL 1000 MG/100ML IV EMUL
INTRAVENOUS | Status: AC
  Administered 2022-06-20: 20 ug/kg/min via INTRAVENOUS
  Filled 2022-06-20: qty 100

## 2022-06-20 MED ORDER — PROPOFOL 1000 MG/100ML IV EMUL
5.0000 ug/kg/min | INTRAVENOUS | Status: DC
  Administered 2022-06-20: 15 ug/kg/min via INTRAVENOUS
  Administered 2022-06-21: 10 ug/kg/min via INTRAVENOUS
  Administered 2022-06-22 (×2): 20 ug/kg/min via INTRAVENOUS
  Administered 2022-06-23: 50 ug/kg/min via INTRAVENOUS
  Administered 2022-06-23 (×2): 30 ug/kg/min via INTRAVENOUS
  Administered 2022-06-24: 20 ug/kg/min via INTRAVENOUS
  Administered 2022-06-24: 50 ug/kg/min via INTRAVENOUS
  Filled 2022-06-20 (×9): qty 100

## 2022-06-20 MED ORDER — GABAPENTIN 600 MG PO TABS
300.0000 mg | ORAL_TABLET | Freq: Three times a day (TID) | ORAL | Status: DC
  Administered 2022-06-20 – 2022-06-23 (×9): 300 mg
  Filled 2022-06-20 (×10): qty 0.5

## 2022-06-20 MED ORDER — POTASSIUM CHLORIDE 20 MEQ PO PACK
40.0000 meq | PACK | Freq: Once | ORAL | Status: AC
  Administered 2022-06-20: 40 meq
  Filled 2022-06-20: qty 2

## 2022-06-20 MED ORDER — GABAPENTIN 600 MG PO TABS
600.0000 mg | ORAL_TABLET | Freq: Three times a day (TID) | ORAL | Status: DC
  Filled 2022-06-20: qty 1

## 2022-06-20 MED ORDER — AMITRIPTYLINE HCL 50 MG PO TABS: 50.0000 mg | ORAL_TABLET | Freq: Every day | ORAL | Status: DC

## 2022-06-20 MED ORDER — INSULIN DETEMIR 100 UNIT/ML ~~LOC~~ SOLN
7.0000 [IU] | Freq: Two times a day (BID) | SUBCUTANEOUS | Status: DC
  Administered 2022-06-20 (×2): 7 [IU] via SUBCUTANEOUS
  Filled 2022-06-20 (×3): qty 0.07

## 2022-06-20 NOTE — Progress Notes (Addendum)
eLink Physician-Brief Progress Note Patient Name: Nicole Montgomery DOB: 23-Nov-1940 MRN: [id number]   Date of Service  06/20/2022  HPI/Events of Note  FeNa calculated from result which returned at 8:42 AM = 0.65 which is c/w prerenal disease. Weight approximately 60 kg. Na+ = 145, Urine Na+ = 34, Creatinine = 2.13 and Urine Creatinine = 77.  eICU Interventions  Plan: LR IV infusion at 50 mL/hour.      Intervention Category Major Interventions: Hypovolemia - evaluation and treatment with fluids  Nicole Montgomery [name] 06/20/2022, 7:24 PM

## 2022-06-20 NOTE — Consult Note (Addendum)
[facility] Nurse Consult Note: Reason for Consult: Consult requested for full thickness abrasions to arms from a fall prior to admission. Performed remotely after review of progress notes and nursing wound care flow sheet for descriptions. Wounds to lower arms are noted to be yellow/red.  Dressing procedure/placement/frequency: Topical treatment orders provided for bedside nurses to perform as follows to promote moist healing: Apply Xeroform gauze to arm wounds Q day, then cover with gauze and kerlex Please re-consult if further assistance is needed.  Thank-you,  Julien Girt MSN, [place], [place], [place], [place]

## 2022-06-20 NOTE — Progress Notes (Addendum)
  Transition of Care [facility]) Screening Note   Patient Details  Name: MONESHA MONREAL Date of Birth: [birth year]   Transition of Care [facility]) CM/SW Contact:    Tom-Johnson, Renea Ee, RN Phone Number: 06/20/2022, 2:40 PM  Patient is admitted for AMS, Flu + and in DKA. CBG on admission was >600. Patient is currently intubated. Ha a foley cath for retention, on restraint for agitation.   Transition of Care Department [facility]) has reviewed patient and no TOC needs or recommendations have been identified at this time. TOC will continue to monitor patient advancement through interdisciplinary progression rounds. If new patient transition needs arise, please place a TOC consult.

## 2022-06-20 NOTE — Progress Notes (Signed)
NAME:  Nicole Montgomery, MRN:  [id number], DOB:  Jun 10, 1941, LOS: 3 ADMISSION DATE:  06/17/2022, CONSULTATION DATE:  06/20/22 REFERRING MD:  EDP, CHIEF COMPLAINT:  AMS   History of Present Illness:  Nicole Montgomery is a 82 y.o. F with PMH significant for Type 2 DM on Ozempic, previous pancreatic adenocarcinoma, status post pancreatectomy, trigeminal Neuralgia, HL who follows with PCP in [place] who presents with altered mental status.  Her husband is at the bedside and provides history-states that she began showing signs of confusion approximately one week ago.  She has had reduced appetite and barely been eating or drinking.  Her confusion worsened to the point she was not responsive on the toilet and so he called EMS who noted glucose >600.   He notes they cared for grandchildren last week who had the flu.  She also has been coughing and short of breath without fever, though had a fall from standing on 12/31.  She did not lose consciousness at that time and did not want to come to the hospital.   In the ED, she was hypertensive, tachycardic, hypoxic requiring [place] and agitated.  CTH was completed without acute findings, labs significant for glucose >1200, bicarb 12, gap 20, beta hydroxy 1.6, WBC 11.3, pH 7.9 and flu A+ with R-sided opacities on CXR.  Her husband states she may have missed her last ozempic injection secondary to her confusion.  PCCM consulted for admission given her AMS  Pertinent  Medical History   has a past medical history of DM (diabetes mellitus) ([place]), H/O splenectomy (2012), HTN (hypertension), Nephrolithiasis, Pancreatic cancer ([place]) (2012), and Sarcoid (2012).   Significant Hospital Events: Including procedures, antibiotic start and stop dates in addition to other pertinent events   1/2 presented with AMS, flu + and in DKA with significant agitation requiring precedex.  Started on antibiotic.  1/3 intubated for sedation needs and oxygen requirement. 1/4 transition from insulin  drip to subQ   Interim History / Subjective:   Remains critically ill, intubated. RN reports intermittent breakthrough agitation requiring Versed Bladder scan showed 250 cc. 8 L positive  Objective   Blood pressure 115/73, pulse 64, temperature (!) 97.2 F (36.2 C), resp. rate (!) 27, height 5' (1.524 m), weight 59.6 kg, SpO2 98 %.    Vent Mode: PRVC FiO2 (%):  [50 %] 50 % Set Rate:  [30 bmp] 30 bmp Vt Set:  [360 mL] 360 mL PEEP:  [5 cmH20] 5 cmH20 Plateau Pressure:  [15 cmH20-17 cmH20] 17 cmH20   Intake/Output Summary (Last 24 hours) at 06/20/2022 0754 Last data filed at 06/20/2022 0700 Gross per 24 hour  Intake 2067.49 ml  Output --  Net 2067.49 ml    Filed Weights   06/17/22 2201 06/19/22 0500  Weight: 54.4 kg 59.6 kg    General: Elderly female received Versed at 5 AM HEENT: [place]/AT, PERRL, no JVD Neuro: RASS -4 CV: RRR, no MRG PULM: Clear breath sounds bilateral, no accessory muscle use GI: Soft, nontender, no guarding Extremities: No acute deformity. No clubbing or cyanosis.  Skin: Grossly intact.    BG down to low 100s.   Labs show hypokalemia, BUN/creatinine back up to 57/2.1, stable hemoglobin, low phosphate has been repleted  Resolved Hospital Problem list   Lactic acidosis  Assessment & Plan:    Type 2 DM with acute DKA; has a history of pancreatectomy from pancreatic adenocarcinoma (2012) but no report of PTA insulin use.  Family not available to corroborate this  history. Limited recent records in her chart. Hypokalemia, resolved - 1/4  Transition from insulin infusion to basal+ SSI -Starting tube feeds, increase basal insulin to 8 units every 12  Acute Encephalopathy> seems prolonged for about a week prior to admission-likely multifactorial from DKA, acute infection. MRI brain unremarkable. History of Tregeminal Neuralgia  - Wean sedation as able for RASS -1.  - Precedex fentanyl -Resume home amitriptyline, gabapentin and Cymbalta - Daily  SAT  Acute hypoxic respiratory failure secondary to Influenza A -Start spontaneous breathing trials - Tamiflu - Empiric antibiotics continue - Daily SBT/SAT -Chest x-ray in a.m.   AKI -monitor renal indices and UOP with resuscitation and avoid nephrotoxins -Creatinine rising, Place Foley for urinary retention -check FENa, fluid if <1   Moderate protein energy malnutrition - Place NG tube, core track has been requested - Start TF    Best Practice (right click and "Reselect all SmartList Selections" daily)   Diet/type: tubefeeds, NPO, and NPO w/ meds via tube DVT prophylaxis: prophylactic heparin  GI prophylaxis: H2B Lines: N/A Foley:  N/A Code Status:  full code Last date of multidisciplinary goals of care discussion [pending, husband updated at the bedside, confirms full code] Son updated at bedside.   Labs   CBC: Recent Labs  Lab 06/17/22 2024 06/17/22 2035 06/17/22 2232 06/18/22 0101 06/18/22 1340 06/19/22 0431 06/20/22 0648  WBC 11.3*  --  9.0 10.7*  --  22.8* PENDING  NEUTROABS 10.8*  --   --   --   --  19.2*  --   HGB 13.7   < > 11.7* 13.7 11.9* 11.7* 11.9*  HCT 48.0*   < > 39.8 43.7 35.0* 36.0 34.5*  MCV 107.4*  --  101.5* 98.0  --  93.3 89.8  PLT 264  --  234 274  --  208 198   < > = values in this interval not displayed.     Basic Metabolic Panel: Recent Labs  Lab 06/18/22 0101 06/18/22 0745 06/18/22 1158 06/18/22 1340 06/18/22 1418 06/19/22 0431 06/19/22 1619 06/20/22 0648  NA 139 144 141 145 142 143  --  145  K 2.8* 4.7 4.1 4.0 4.7 4.4  --  3.5  CL 107 107 107  --  111 114*  --  115*  CO2 17* 26 23  --  24 21*  --  20*  GLUCOSE 710* 298* 177*  --  396* 118*  --  157*  BUN 29* 28* 32*  --  32* 39*  --  57*  CREATININE 1.32* 1.23* 1.40*  --  1.47* 1.88*  --  2.13*  CALCIUM 9.0 8.9 8.6*  --  7.9* 8.5*  --  8.2*  MG 2.3  --   --   --   --   --  2.1 1.8  PHOS 3.0  --   --   --   --   --  1.5* 3.8    GFR: Estimated Creatinine Clearance:  16.7 mL/min (A) (by C-G formula based on SCr of 2.13 mg/dL (H)). Recent Labs  Lab 06/17/22 2232 06/18/22 0101 06/18/22 0819 06/18/22 1158 06/18/22 1845 06/18/22 2128 06/19/22 0431 06/20/22 0648  WBC 9.0 10.7*  --   --   --   --  22.8* PENDING  LATICACIDVEN 6.3*  --  3.2* 3.9* 2.8* 1.9  --   --     Critical care time:  34 minutes  Erica Osuna V. Elsworth Soho MD  See Shea Evans for personal pager PCCM on call pager [phone] until 7pm. Please call Elink 7p-7a. [phone]  06/20/2022 7:54 AM

## 2022-06-20 NOTE — Progress Notes (Signed)
Inpatient Diabetes Program Recommendations  AACE/ADA: New Consensus Statement on Inpatient Glycemic Control (2015)  Target Ranges:  Prepandial:   less than 140 mg/dL      Peak postprandial:   less than 180 mg/dL (1-2 hours)      Critically ill patients:  140 - 180 mg/dL   Lab Results  Component Value Date   GLUCAP 134 (H) 06/20/2022    Review of Glycemic Control  Latest Reference Range & Units 06/19/22 19:04 06/19/22 23:37 06/20/22 03:38 06/20/22 07:46  Glucose-Capillary 70 - 99 mg/dL 229 (H) 239 (H) 209 (H) 134 (H)  Diabetes history: DM 2 Outpatient Diabetes medications:  Ozempic 0.5 mg weekly Current orders for Inpatient glycemic control:  Levemir 7 units bid  Novolog 1-3 units q 4 hours Inpatient Diabetes Program Recommendations:    Note that Levemir increased today.  If blood sugars remain>180 mg/dL, consider adding tube feed coverage.    Thanks,  Adah Perl, RN, BC-ADM Inpatient Diabetes Coordinator Pager [phone]  (8a-5p)

## 2022-06-21 ENCOUNTER — Inpatient Hospital Stay (HOSPITAL_COMMUNITY): Payer: PPO

## 2022-06-21 DIAGNOSIS — E1011 Type 1 diabetes mellitus with ketoacidosis with coma: Secondary | ICD-10-CM | POA: Diagnosis not present

## 2022-06-21 LAB — CBC WITH DIFFERENTIAL/PLATELET
Abs Immature Granulocytes: 1 10*3/uL — ABNORMAL HIGH (ref 0.00–0.07)
Basophils Absolute: 0.1 10*3/uL (ref 0.0–0.1)
Basophils Relative: 0 %
Eosinophils Absolute: 0 10*3/uL (ref 0.0–0.5)
Eosinophils Relative: 0 %
HCT: 33.5 % — ABNORMAL LOW (ref 36.0–46.0)
Hemoglobin: 11.1 g/dL — ABNORMAL LOW (ref 12.0–15.0)
Immature Granulocytes: 4 %
Lymphocytes Relative: 4 %
Lymphs Abs: 1.1 10*3/uL (ref 0.7–4.0)
MCH: 30.7 pg (ref 26.0–34.0)
MCHC: 33.1 g/dL (ref 30.0–36.0)
MCV: 92.8 fL (ref 80.0–100.0)
Monocytes Absolute: 0.5 10*3/uL (ref 0.1–1.0)
Monocytes Relative: 2 %
Neutro Abs: 26.3 10*3/uL — ABNORMAL HIGH (ref 1.7–7.7)
Neutrophils Relative %: 90 %
Platelets: ADEQUATE 10*3/uL (ref 150–400)
RBC: 3.61 MIL/uL — ABNORMAL LOW (ref 3.87–5.11)
RDW: 16.5 % — ABNORMAL HIGH (ref 11.5–15.5)
WBC: 29 10*3/uL — ABNORMAL HIGH (ref 4.0–10.5)
nRBC: 6.7 % — ABNORMAL HIGH (ref 0.0–0.2)

## 2022-06-21 LAB — PHOSPHORUS: Phosphorus: 2.9 mg/dL (ref 2.5–4.6)

## 2022-06-21 LAB — GLUCOSE, CAPILLARY
Glucose-Capillary: 320 mg/dL — ABNORMAL HIGH (ref 70–99)
Glucose-Capillary: 288 mg/dL — ABNORMAL HIGH (ref 70–99)
Glucose-Capillary: 240 mg/dL — ABNORMAL HIGH (ref 70–99)
Glucose-Capillary: 245 mg/dL — ABNORMAL HIGH (ref 70–99)
Glucose-Capillary: 235 mg/dL — ABNORMAL HIGH (ref 70–99)
Glucose-Capillary: 189 mg/dL — ABNORMAL HIGH (ref 70–99)
Glucose-Capillary: 216 mg/dL — ABNORMAL HIGH (ref 70–99)
Glucose-Capillary: 192 mg/dL — ABNORMAL HIGH (ref 70–99)
Glucose-Capillary: 271 mg/dL — ABNORMAL HIGH (ref 70–99)
Glucose-Capillary: 291 mg/dL — ABNORMAL HIGH (ref 70–99)
Glucose-Capillary: 296 mg/dL — ABNORMAL HIGH (ref 70–99)

## 2022-06-21 LAB — BASIC METABOLIC PANEL
Anion gap: 11 (ref 5–15)
BUN: 62 mg/dL — ABNORMAL HIGH (ref 8–23)
CO2: 18 mmol/L — ABNORMAL LOW (ref 22–32)
Calcium: 7.5 mg/dL — ABNORMAL LOW (ref 8.9–10.3)
Chloride: 115 mmol/L — ABNORMAL HIGH (ref 98–111)
Creatinine, Ser: 2.23 mg/dL — ABNORMAL HIGH (ref 0.44–1.00)
GFR, Estimated: 22 mL/min — ABNORMAL LOW (ref >60)
Glucose, Bld: 270 mg/dL — ABNORMAL HIGH (ref 70–99)
Potassium: 4.6 mmol/L (ref 3.5–5.1)
Sodium: 144 mmol/L (ref 135–145)

## 2022-06-21 LAB — TRIGLYCERIDES: Triglycerides: 167 mg/dL — ABNORMAL HIGH (ref <150)

## 2022-06-21 LAB — MAGNESIUM: Magnesium: 1.9 mg/dL (ref 1.7–2.4)

## 2022-06-21 MED ORDER — MAGNESIUM SULFATE 2 GM/50ML IV SOLN
2.0000 g | Freq: Once | INTRAVENOUS | Status: AC
  Administered 2022-06-21: 2 g via INTRAVENOUS
  Filled 2022-06-21: qty 50

## 2022-06-21 MED ORDER — FUROSEMIDE 10 MG/ML IJ SOLN
60.0000 mg | Freq: Once | INTRAMUSCULAR | Status: AC
  Administered 2022-06-21: 60 mg via INTRAVENOUS
  Filled 2022-06-21: qty 6

## 2022-06-21 MED ORDER — FUROSEMIDE 10 MG/ML IJ SOLN
40.0000 mg | Freq: Three times a day (TID) | INTRAMUSCULAR | Status: DC
  Administered 2022-06-21 – 2022-06-23 (×6): 40 mg via INTRAVENOUS
  Filled 2022-06-21 (×6): qty 4

## 2022-06-21 MED ORDER — INSULIN ASPART 100 UNIT/ML IJ SOLN
4.0000 [IU] | INTRAMUSCULAR | Status: DC
  Administered 2022-06-21: 4 [IU] via SUBCUTANEOUS

## 2022-06-21 MED ORDER — DEXTROSE 10 % IV SOLN: INTRAVENOUS | Status: DC

## 2022-06-21 MED ORDER — OSELTAMIVIR PHOSPHATE 30 MG PO CAPS
30.0000 mg | ORAL_CAPSULE | Freq: Every day | ORAL | Status: AC
  Administered 2022-06-22 – 2022-06-23 (×2): 30 mg
  Filled 2022-06-21 (×2): qty 1

## 2022-06-21 MED ORDER — DEXTROSE 50 % IV SOLN: 0.0000 mL | INTRAVENOUS | Status: DC | PRN

## 2022-06-21 MED ORDER — INSULIN DETEMIR 100 UNIT/ML ~~LOC~~ SOLN
12.0000 [IU] | Freq: Two times a day (BID) | SUBCUTANEOUS | Status: DC
  Administered 2022-06-21: 12 [IU] via SUBCUTANEOUS
  Filled 2022-06-21 (×3): qty 0.12

## 2022-06-21 MED ORDER — INSULIN DETEMIR 100 UNIT/ML ~~LOC~~ SOLN
12.0000 [IU] | Freq: Every day | SUBCUTANEOUS | Status: DC
  Administered 2022-06-21: 12 [IU] via SUBCUTANEOUS
  Filled 2022-06-21: qty 0.12

## 2022-06-21 MED ORDER — INSULIN ASPART 100 UNIT/ML IJ SOLN
6.0000 [IU] | INTRAMUSCULAR | Status: DC
  Administered 2022-06-21 – 2022-06-24 (×15): 6 [IU] via SUBCUTANEOUS

## 2022-06-21 MED ORDER — ALBUMIN HUMAN 5 % IV SOLN
12.5000 g | Freq: Once | INTRAVENOUS | Status: AC
  Administered 2022-06-21: 12.5 g via INTRAVENOUS
  Filled 2022-06-21: qty 250

## 2022-06-21 MED ORDER — INSULIN REGULAR(HUMAN) IN NACL 100-0.9 UT/100ML-% IV SOLN
INTRAVENOUS | Status: DC
  Administered 2022-06-21: 7.5 [IU]/h via INTRAVENOUS
  Filled 2022-06-21: qty 100

## 2022-06-21 MED ORDER — INSULIN ASPART 100 UNIT/ML IJ SOLN
0.0000 [IU] | INTRAMUSCULAR | Status: DC
  Administered 2022-06-21 – 2022-06-22 (×4): 8 [IU] via SUBCUTANEOUS
  Administered 2022-06-22: 5 [IU] via SUBCUTANEOUS

## 2022-06-21 NOTE — Progress Notes (Signed)
eLink Physician-Brief Progress Note Patient Name: Nicole Montgomery DOB: Jul 09, 1940 MRN: [id number]   Date of Service  06/21/2022  HPI/Events of Note  Hyperglycemia - Blood glucose = 213 --> 216 --> 320.  eICU Interventions  Plan: Insulin IV infusion per [facility] protocol.  D/C Insulin [place].      Intervention Category Major Interventions: Hyperglycemia - active titration of insulin therapy  Shefali Ng [place] 06/21/2022, 4:09 AM

## 2022-06-21 NOTE — Progress Notes (Signed)
NAME:  Nicole Montgomery, MRN:  [id number], DOB:  [birth year], LOS: 4 ADMISSION DATE:  06/17/2022, CONSULTATION DATE:  06/21/22 REFERRING MD:  EDP, CHIEF COMPLAINT:  AMS   History of Present Illness:  Nicole Montgomery is a 82 y.o. F with PMH significant for Type 2 DM on Ozempic, previous pancreatic adenocarcinoma, status post pancreatectomy, trigeminal Neuralgia, HL who follows with PCP in [place] who presents with altered mental status.  Her husband is at the bedside and provides history-states that she began showing signs of confusion approximately one week ago.  She has had reduced appetite and barely been eating or drinking.  Her confusion worsened to the point she was not responsive on the toilet and so he called EMS who noted glucose >600.   He notes they cared for grandchildren last week who had the flu.  She also has been coughing and short of breath without fever, though had a fall from standing on 12/31.  She did not lose consciousness at that time and did not want to come to the hospital.   In the ED, she was hypertensive, tachycardic, hypoxic requiring [place] and agitated.  CTH was completed without acute findings, labs significant for glucose >1200, bicarb 12, gap 20, beta hydroxy 1.6, WBC 11.3, pH 7.9 and flu A+ with R-sided opacities on CXR.  Her husband states she may have missed her last ozempic injection secondary to her confusion.  PCCM consulted for admission given her AMS  Pertinent  Medical History   has a past medical history of DM (diabetes mellitus) ([place]), H/O splenectomy (2012), HTN (hypertension), Nephrolithiasis, Pancreatic cancer ([place]) (2012), and Sarcoid (2012).  Significant Hospital Events: Including procedures, antibiotic start and stop dates in addition to other pertinent events   1/2 presented with AMS, flu + and in DKA with significant agitation requiring precedex.  Started on antibiotic.  1/3 intubated for sedation needs and oxygen requirement. 1/4 transition from insulin drip  to subQ  1/6 restarted on insulin overnight for hyperglycemia but anion gap remains closed.  Mild vent dyssynchrony with inability to tolerate SBT  Interim History / Subjective:   Remains critically ill, intubated. RN reports intermittent breakthrough agitation requiring Versed Bladder scan showed 250 cc. 8 L positive  Objective   Blood pressure 127/67, pulse (!) 108, temperature 99 F (37.2 C), temperature source Esophageal, resp. rate 10, height 5' (1.524 m), weight 61.9 kg, SpO2 96 %.    Vent Mode: PRVC FiO2 (%):  [40 %-50 %] 40 % Set Rate:  [20 bmp] 20 bmp Vt Set:  [360 mL] 360 mL PEEP:  [5 cmH20] 5 cmH20 Pressure Support:  [10 cmH20] 10 cmH20 Plateau Pressure:  [5 cmH20] 5 cmH20   Intake/Output Summary (Last 24 hours) at 06/21/2022 1032 Last data filed at 06/21/2022 0900 Gross per 24 hour  Intake 2258.22 ml  Output 695 ml  Net 1563.22 ml    Filed Weights   06/17/22 2201 06/19/22 0500 06/21/22 0419  Weight: 54.4 kg 59.6 kg 61.9 kg   General: Acute on chronic ill-appearing elderly female lying in bed in mechanical ventilation in no acute distress HEENT: ETT, MM pink/moist, PERRL,  Neuro: Unresponsive CV: s1s2 regular rate and rhythm, no murmur, rubs, or gallops,  PULM: Vent dyssynchrony with bilateral rhonchi, 40% FiO2 GI: soft, bowel sounds active in all 4 quadrants, non-tender, non-distended, tolerating TF Extremities: warm/dry, generalized anasarca Skin: no rashes or lesions  Resolved Hospital Problem list   Lactic acidosis Hypokalemia  Assessment & Plan:  Type 2 DM with acute DKA -Hhas a history of pancreatectomy from pancreatic adenocarcinoma (2012) but no report of PTA insulin use.  Family not available to corroborate this history. Limited recent records in her chart. P: Restarted on insulin drip overnight due to hyperglycemia transition back to sliding scale insulin today Start tube feed coverage Closely monitor patient's electrolytes Monitor renal  function Check hemoglobin A1c  Acute Encephalopathy -Seems prolonged for about a week prior to admission-likely multifactorial from DKA, acute infection. MRI brain unremarkable. History of Tregeminal Neuralgia  P: Maintain neuro protective measures; goal for eurothermia, euglycemia, eunatermia, normoxia, and PCO2 goal of 35-40 Nutrition and bowel regiment  Seizure precautions  Aspirations precautions  Attempt to wean sedation as able   Acute hypoxic respiratory failure secondary to Influenza A with high suspicion of bacterial pneumonia -Chest x-ray with bilateral airspace disease more extensive on the right and mildly progressed P: Continue ventilator support with lung protective strategies  Wean PEEP and FiO2 for sats greater than 90%. Head of bed elevated 30 degrees. Plateau pressures less than 30 cm H20.  Follow intermittent chest x-ray and ABG.   SAT/SBT as tolerated, mentation preclude extubation  Ensure adequate pulmonary hygiene  Follow cultures  VAP bundle in place  PAD protocol Continue azithromycin and ceftriaxone Continue Tamiflu  Acute Kidney Injury  -in the setting of above.  Creatinine 0.80 with GFR greater than 60 May 2009 (the available records), creatinine on admission 1.57 with GFR 33.  AKI continues to worsen with creatinine 2.23, GFR 22 as of 1/6 P: Follow renal function  Monitor urine output Trial of Lasix given 9 plus liters positive Trend Bmet Avoid nephrotoxins Ensure adequate renal perfusion   Moderate protein energy malnutrition P: Continue tube feeds  Best Practice (right click and "Reselect all SmartList Selections" daily)   Diet/type: tubefeeds, NPO, and NPO w/ meds via tube DVT prophylaxis: prophylactic heparin  GI prophylaxis: H2B Lines: N/A Foley:  N/A Code Status:  full code Last date of multidisciplinary goals of care discussion [pending, husband updated at the bedside, confirms full code] Son updated at bedside.   CRITICAL  CARE Performed by: Maleeha Halls D. Harris   Total critical care time: 35 minutes  Critical care time was exclusive of separately billable procedures and treating other patients.  Critical care was necessary to treat or prevent imminent or life-threatening deterioration.  Critical care was time spent personally by me on the following activities: development of treatment plan with patient and/or surrogate as well as nursing, discussions with consultants, evaluation of patient's response to treatment, examination of patient, obtaining history from patient or surrogate, ordering and performing treatments and interventions, ordering and review of laboratory studies, ordering and review of radiographic studies, pulse oximetry and re-evaluation of patient's condition.

## 2022-06-22 ENCOUNTER — Encounter (HOSPITAL_COMMUNITY): Payer: Self-pay | Admitting: Pulmonary Disease

## 2022-06-22 ENCOUNTER — Inpatient Hospital Stay (HOSPITAL_COMMUNITY): Payer: PPO

## 2022-06-22 DIAGNOSIS — R0603 Acute respiratory distress: Secondary | ICD-10-CM

## 2022-06-22 DIAGNOSIS — E1011 Type 1 diabetes mellitus with ketoacidosis with coma: Secondary | ICD-10-CM | POA: Diagnosis not present

## 2022-06-22 DIAGNOSIS — J9601 Acute respiratory failure with hypoxia: Secondary | ICD-10-CM | POA: Diagnosis not present

## 2022-06-22 DIAGNOSIS — J189 Pneumonia, unspecified organism: Secondary | ICD-10-CM | POA: Diagnosis not present

## 2022-06-22 DIAGNOSIS — J101 Influenza due to other identified influenza virus with other respiratory manifestations: Secondary | ICD-10-CM | POA: Diagnosis not present

## 2022-06-22 LAB — CBC
HCT: 32.2 % — ABNORMAL LOW (ref 36.0–46.0)
Hemoglobin: 10.8 g/dL — ABNORMAL LOW (ref 12.0–15.0)
MCH: 30.9 pg (ref 26.0–34.0)
MCHC: 33.5 g/dL (ref 30.0–36.0)
MCV: 92.3 fL (ref 80.0–100.0)
Platelets: 189 10*3/uL (ref 150–400)
RBC: 3.49 MIL/uL — ABNORMAL LOW (ref 3.87–5.11)
RDW: 16.6 % — ABNORMAL HIGH (ref 11.5–15.5)
WBC: 27.7 10*3/uL — ABNORMAL HIGH (ref 4.0–10.5)
nRBC: 6.2 % — ABNORMAL HIGH (ref 0.0–0.2)

## 2022-06-22 LAB — ECHOCARDIOGRAM COMPLETE
AR max vel: 3.37 cm2
AV Area VTI: 3.63 cm2
AV Area mean vel: 3.28 cm2
AV Mean grad: 9 mmHg
AV Peak grad: 14.9 mmHg
Ao pk vel: 1.93 m/s
Area-P 1/2: 3.91 cm2
Calc EF: 73.8 %
Height: 60 in
MV M vel: 2.53 m/s
MV Peak grad: 25.6 mmHg
S' Lateral: 2.3 cm
Single Plane A2C EF: 74.8 %
Single Plane A4C EF: 73.2 %
Weight: 2317.48 oz

## 2022-06-22 LAB — POCT I-STAT 7, (LYTES, BLD GAS, ICA,H+H)
Acid-Base Excess: 10 mmol/L — ABNORMAL HIGH (ref 0.0–2.0)
Bicarbonate: 35.3 mmol/L — ABNORMAL HIGH (ref 20.0–28.0)
Calcium, Ion: 1.14 mmol/L — ABNORMAL LOW (ref 1.15–1.40)
HCT: 34 % — ABNORMAL LOW (ref 36.0–46.0)
Hemoglobin: 11.6 g/dL — ABNORMAL LOW (ref 12.0–15.0)
O2 Saturation: 98 %
Patient temperature: 37.9
Potassium: 3.5 mmol/L (ref 3.5–5.1)
Sodium: 150 mmol/L — ABNORMAL HIGH (ref 135–145)
TCO2: 37 mmol/L — ABNORMAL HIGH (ref 22–32)
pCO2 arterial: 53.2 mmHg — ABNORMAL HIGH (ref 32–48)
pH, Arterial: 7.433 (ref 7.35–7.45)
pO2, Arterial: 115 mmHg — ABNORMAL HIGH (ref 83–108)

## 2022-06-22 LAB — BASIC METABOLIC PANEL
Anion gap: 10 (ref 5–15)
BUN: 59 mg/dL — ABNORMAL HIGH (ref 8–23)
CO2: 26 mmol/L (ref 22–32)
Calcium: 8.1 mg/dL — ABNORMAL LOW (ref 8.9–10.3)
Chloride: 108 mmol/L (ref 98–111)
Creatinine, Ser: 2.16 mg/dL — ABNORMAL HIGH (ref 0.44–1.00)
GFR, Estimated: 22 mL/min — ABNORMAL LOW (ref >60)
Glucose, Bld: 293 mg/dL — ABNORMAL HIGH (ref 70–99)
Potassium: 4.5 mmol/L (ref 3.5–5.1)
Sodium: 144 mmol/L (ref 135–145)

## 2022-06-22 LAB — CK TOTAL AND CKMB (NOT AT ARMC)
CK, MB: 2.9 ng/mL (ref 0.5–5.0)
Relative Index: INVALID (ref 0.0–2.5)
Total CK: 86 U/L (ref 38–234)

## 2022-06-22 LAB — GLUCOSE, CAPILLARY
Glucose-Capillary: 136 mg/dL — ABNORMAL HIGH (ref 70–99)
Glucose-Capillary: 138 mg/dL — ABNORMAL HIGH (ref 70–99)
Glucose-Capillary: 229 mg/dL — ABNORMAL HIGH (ref 70–99)
Glucose-Capillary: 244 mg/dL — ABNORMAL HIGH (ref 70–99)
Glucose-Capillary: 257 mg/dL — ABNORMAL HIGH (ref 70–99)
Glucose-Capillary: 259 mg/dL — ABNORMAL HIGH (ref 70–99)

## 2022-06-22 LAB — MAGNESIUM: Magnesium: 2.2 mg/dL (ref 1.7–2.4)

## 2022-06-22 LAB — PHOSPHORUS: Phosphorus: 2 mg/dL — ABNORMAL LOW (ref 2.5–4.6)

## 2022-06-22 LAB — STREP PNEUMONIAE URINARY ANTIGEN: Strep Pneumo Urinary Antigen: NEGATIVE

## 2022-06-22 LAB — TROPONIN I (HIGH SENSITIVITY): Troponin I (High Sensitivity): 19 ng/L — ABNORMAL HIGH (ref <18)

## 2022-06-22 LAB — PROCALCITONIN: Procalcitonin: 4.51 ng/mL

## 2022-06-22 MED ORDER — INSULIN ASPART 100 UNIT/ML IJ SOLN
0.0000 [IU] | INTRAMUSCULAR | Status: DC
  Administered 2022-06-22: 11 [IU] via SUBCUTANEOUS
  Administered 2022-06-22: 7 [IU] via SUBCUTANEOUS
  Administered 2022-06-22 – 2022-06-23 (×3): 3 [IU] via SUBCUTANEOUS
  Administered 2022-06-23: 7 [IU] via SUBCUTANEOUS
  Administered 2022-06-23: 4 [IU] via SUBCUTANEOUS
  Administered 2022-06-23: 7 [IU] via SUBCUTANEOUS
  Administered 2022-06-24: 4 [IU] via SUBCUTANEOUS
  Administered 2022-06-24: 7 [IU] via SUBCUTANEOUS
  Administered 2022-06-24 (×2): 3 [IU] via SUBCUTANEOUS
  Administered 2022-06-25 (×2): 7 [IU] via SUBCUTANEOUS
  Administered 2022-06-25: 11 [IU] via SUBCUTANEOUS
  Administered 2022-06-25 – 2022-06-26 (×3): 4 [IU] via SUBCUTANEOUS
  Administered 2022-06-26: 7 [IU] via SUBCUTANEOUS

## 2022-06-22 MED ORDER — INSULIN DETEMIR 100 UNIT/ML ~~LOC~~ SOLN
15.0000 [IU] | Freq: Two times a day (BID) | SUBCUTANEOUS | Status: DC
  Administered 2022-06-22 – 2022-06-24 (×5): 15 [IU] via SUBCUTANEOUS
  Filled 2022-06-22 (×8): qty 0.15

## 2022-06-22 MED ORDER — SODIUM PHOSPHATES 45 MMOLE/15ML IV SOLN
15.0000 mmol | Freq: Once | INTRAVENOUS | Status: AC
  Administered 2022-06-22: 15 mmol via INTRAVENOUS
  Filled 2022-06-22: qty 5

## 2022-06-22 NOTE — Progress Notes (Incomplete)
  Echocardiogram 2D Echocardiogram has been performed.  Lana Fish 06/22/2022, 11:47 AM

## 2022-06-22 NOTE — Progress Notes (Addendum)
eLink Physician-Brief Progress Note Patient Name: Nicole Montgomery DOB: 03/01/1941 MRN: [id number]   Date of Service  06/22/2022  HPI/Events of Note  Notified that patient had increase in Fio2 from 40% to 55%. Current o2 sat is 95. No distress on low dose propofol. RN notes she had some wheezing earlier, was given nebs as well. CXR yesterday noted. She was on PS and has been moved to [facility]  eICU Interventions  Get CXR     Intervention Category Major Interventions: Respiratory failure - evaluation and management  Lester Crickenberger G Cheyann Blecha 06/22/2022, 9:05 PM  Addendum at 9:40 pm - CXR reviewed. ETT Is ok. Persistent infiltrates. No PTX. Will also do an ABG since now on PRVC.

## 2022-06-22 NOTE — Progress Notes (Signed)
NAME:  Nicole Montgomery, MRN:  [id number], DOB:  19-Aug-1940, LOS: 5 ADMISSION DATE:  06/17/2022, CONSULTATION DATE:  06/22/22 REFERRING MD:  EDP, CHIEF COMPLAINT:  AMS   History of Present Illness:  Nicole Montgomery is a 82 y.o. F with PMH significant for Type 2 DM on Ozempic, previous pancreatic adenocarcinoma, status post pancreatectomy, trigeminal Neuralgia, HL who follows with PCP in [place] who presents with altered mental status.  Her husband is at the bedside and provides history-states that she began showing signs of confusion approximately one week ago.  She has had reduced appetite and barely been eating or drinking.  Her confusion worsened to the point she was not responsive on the toilet and so he called EMS who noted glucose >600.   He notes they cared for grandchildren last week who had the flu.  She also has been coughing and short of breath without fever, though had a fall from standing on 12/31.  She did not lose consciousness at that time and did not want to come to the hospital.   In the ED, she was hypertensive, tachycardic, hypoxic requiring [place] and agitated.  CTH was completed without acute findings, labs significant for glucose >1200, bicarb 12, gap 20, beta hydroxy 1.6, WBC 11.3, pH 7.9 and flu A+ with R-sided opacities on CXR.  Her husband states she may have missed her last ozempic injection secondary to her confusion.  PCCM consulted for admission given her AMS  Pertinent  Medical History   has a past medical history of DM (diabetes mellitus) ([place]), H/O splenectomy (2012), HTN (hypertension), Nephrolithiasis, Pancreatic cancer ([place]) (2012), and Sarcoid (2012).  Significant Hospital Events: Including procedures, antibiotic start and stop dates in addition to other pertinent events   1/2 presented with AMS, flu + and in DKA with significant agitation requiring precedex.  Started on antibiotic.  1/3 intubated for sedation needs and oxygen requirement. 1/4 transition from insulin drip  to subQ  1/6 restarted on insulin overnight for hyperglycemia but anion gap remains closed.  Mild vent dyssynchrony with inability to tolerate SBT 1/7 no acute events overnight, glucose remains elevated will need up titration of insulin management.  Tolerating SBT trial this a.m. diuresed almost 5 L with use of diuretics  Interim History / Subjective:  Status improved this a.m. with nearly 5 L diuresed thus far Seems more compliant with ventilator and tolerating SBT trial this a.m.  Objective   Blood pressure (!) 149/67, pulse 100, temperature 100 F (37.8 C), temperature source Esophageal, resp. rate 13, height 5' (1.524 m), weight 65.7 kg, SpO2 93 %.    Vent Mode: PSV;CPAP FiO2 (%):  [40 %] 40 % Set Rate:  [20 bmp] 20 bmp Vt Set:  [360 mL] 360 mL PEEP:  [5 cmH20] 5 cmH20 Pressure Support:  [10 cmH20] 10 cmH20   Intake/Output Summary (Last 24 hours) at 06/22/2022 0931 Last data filed at 06/22/2022 0900 Gross per 24 hour  Intake 1940.15 ml  Output 4985 ml  Net -3044.85 ml    Filed Weights   06/19/22 0500 06/21/22 0419 06/22/22 0339  Weight: 59.6 kg 61.9 kg 65.7 kg   Physical exam General: Acute on chronic ill-appearing deconditioned elderly female lying in bed on mechanical ventilation in no acute distress HEENT: ETT, MM pink/moist, PERRL,  Neuro: Opens eyes to verbal stimuli, intermittently follows simple commands CV: s1s2 regular rate and rhythm, no murmur, rubs, or gallops,  PULM: Slightly diminished air entry bilaterally, faint rhonchi to bases, tolerating ventilator,  no increased work of breathing GI: soft, bowel sounds active in all 4 quadrants, non-tender, non-distended, tolerating TF Extremities: warm/dry, generalized 2+ edema  Skin: no rashes or lesions  General: Acute on chronic ill-appearing elderly female lying in bed in mechanical ventilation in no acute distress HEENT: ETT, MM pink/moist, PERRL,  Neuro: Unresponsive CV: s1s2 regular rate and rhythm, no murmur,  rubs, or gallops,  PULM: Vent dyssynchrony with bilateral rhonchi, 40% FiO2 GI: soft, bowel sounds active in all 4 quadrants, non-tender, non-distended, tolerating TF Extremities: warm/dry, generalized anasarca Skin: no rashes or lesions  Resolved Hospital Problem list   Lactic acidosis Hypokalemia  Assessment & Plan:   Type 2 DM with acute DKA -Hhas a history of pancreatectomy from pancreatic adenocarcinoma (2012) but no report of PTA insulin use.  Family not available to corroborate this history. Limited recent records in her chart. P: Increase SSI to resistant scale Increase long-acting insulin to 15 Continue tube feed coverage Closely monitor and replace electrolytes as needed Monitor renal function Follow hemoglobin A1c  Acute Encephalopathy -Seems prolonged for about a week prior to admission-likely multifactorial from DKA, acute infection. MRI brain unremarkable. History of Tregeminal Neuralgia  P: Maintain neuroprotective measures Wean sedation as able Seizure precautions Aspiration precautions Nutrition and bowel regimen Delirium precautions  Acute hypoxic respiratory failure secondary to Influenza A with high suspicion of superimposed bacterial pneumonia -Chest x-ray with bilateral airspace disease more extensive on the right and mildly progressed -Procalcitonin 4.51 P: Continue ventilator support with lung protective strategies  Wean PEEP and FiO2 for sats greater than 90%. Head of bed elevated 30 degrees. Plateau pressures less than 30 cm H20.  Follow intermittent chest x-ray and ABG.   SAT/SBT as tolerated, mentation preclude extubation  Ensure adequate pulmonary hygiene  Follow cultures  VAP bundle in place  PAD protocol Continue Tamiflu Continue azithromycin and ceftriaxone Check strep pneumonia and Legionella urine antigen Consider stress dose steroids but this will make diabetes management more difficult  Acute Kidney Injury  -in the setting of  above.  Creatinine 0.80 with GFR greater than 60 May 2009 (the available records), creatinine on admission 1.57 with GFR 33.  AKI continues to worsen with creatinine 2.23, GFR 22 as of 1/6 P: Slowly improving in the setting of diuresing Follow renal function Monitor urine output Trend bmet Avoid nephrotoxins Ensure adequate renal perfusion  Moderate protein energy malnutrition P: Continue tube feeds  Best Practice (right click and "Reselect all SmartList Selections" daily)   Diet/type: tubefeeds, NPO, and NPO w/ meds via tube DVT prophylaxis: prophylactic heparin  GI prophylaxis: H2B Lines: N/A Foley:  N/A Code Status:  full code Last date of multidisciplinary goals of care discussion [pending, husband updated at the bedside, confirms full code] Son updated at bedside.   CRITICAL CARE Performed by: Jenine Krisher D. Harris  Total critical care time: 40 minutes  Critical care time was exclusive of separately billable procedures and treating other patients.  Critical care was necessary to treat or prevent imminent or life-threatening deterioration.  Critical care was time spent personally by me on the following activities: development of treatment plan with patient and/or surrogate as well as nursing, discussions with consultants, evaluation of patient's response to treatment, examination of patient, obtaining history from patient or surrogate, ordering and performing treatments and interventions, ordering and review of laboratory studies, ordering and review of radiographic studies, pulse oximetry and re-evaluation of patient's condition.

## 2022-06-23 DIAGNOSIS — J9601 Acute respiratory failure with hypoxia: Secondary | ICD-10-CM | POA: Diagnosis not present

## 2022-06-23 LAB — CBC
HCT: 35.3 % — ABNORMAL LOW (ref 36.0–46.0)
Hemoglobin: 11.4 g/dL — ABNORMAL LOW (ref 12.0–15.0)
MCH: 30.2 pg (ref 26.0–34.0)
MCHC: 32.3 g/dL (ref 30.0–36.0)
MCV: 93.6 fL (ref 80.0–100.0)
Platelets: 220 10*3/uL (ref 150–400)
RBC: 3.77 MIL/uL — ABNORMAL LOW (ref 3.87–5.11)
RDW: 15.9 % — ABNORMAL HIGH (ref 11.5–15.5)
WBC: 24.2 10*3/uL — ABNORMAL HIGH (ref 4.0–10.5)
nRBC: 5.6 % — ABNORMAL HIGH (ref 0.0–0.2)

## 2022-06-23 LAB — COMPREHENSIVE METABOLIC PANEL
ALT: 34 U/L (ref 0–44)
AST: 66 U/L — ABNORMAL HIGH (ref 15–41)
Albumin: 1.5 g/dL — ABNORMAL LOW (ref 3.5–5.0)
Alkaline Phosphatase: 266 U/L — ABNORMAL HIGH (ref 38–126)
Anion gap: 12 (ref 5–15)
BUN: 54 mg/dL — ABNORMAL HIGH (ref 8–23)
CO2: 32 mmol/L (ref 22–32)
Calcium: 8.2 mg/dL — ABNORMAL LOW (ref 8.9–10.3)
Chloride: 103 mmol/L (ref 98–111)
Creatinine, Ser: 1.77 mg/dL — ABNORMAL HIGH (ref 0.44–1.00)
GFR, Estimated: 29 mL/min — ABNORMAL LOW (ref >60)
Glucose, Bld: 175 mg/dL — ABNORMAL HIGH (ref 70–99)
Potassium: 4 mmol/L (ref 3.5–5.1)
Sodium: 147 mmol/L — ABNORMAL HIGH (ref 135–145)
Total Bilirubin: 0.7 mg/dL (ref 0.3–1.2)
Total Protein: 5.8 g/dL — ABNORMAL LOW (ref 6.5–8.1)

## 2022-06-23 LAB — GLUCOSE, CAPILLARY
Glucose-Capillary: 104 mg/dL — ABNORMAL HIGH (ref 70–99)
Glucose-Capillary: 142 mg/dL — ABNORMAL HIGH (ref 70–99)
Glucose-Capillary: 163 mg/dL — ABNORMAL HIGH (ref 70–99)
Glucose-Capillary: 189 mg/dL — ABNORMAL HIGH (ref 70–99)
Glucose-Capillary: 201 mg/dL — ABNORMAL HIGH (ref 70–99)
Glucose-Capillary: 209 mg/dL — ABNORMAL HIGH (ref 70–99)

## 2022-06-23 LAB — CULTURE, BLOOD (ROUTINE X 2)
Culture: NO GROWTH
Culture: NO GROWTH

## 2022-06-23 LAB — HEMOGLOBIN A1C
Hgb A1c MFr Bld: 10.1 % — ABNORMAL HIGH (ref 4.8–5.6)
Mean Plasma Glucose: 243 mg/dL

## 2022-06-23 LAB — MAGNESIUM: Magnesium: 1.9 mg/dL (ref 1.7–2.4)

## 2022-06-23 LAB — PHOSPHORUS: Phosphorus: 2.6 mg/dL (ref 2.5–4.6)

## 2022-06-23 LAB — PROCALCITONIN: Procalcitonin: 2.54 ng/mL

## 2022-06-23 MED ORDER — FUROSEMIDE 10 MG/ML IJ SOLN
40.0000 mg | Freq: Once | INTRAMUSCULAR | Status: AC
  Administered 2022-06-23: 40 mg via INTRAVENOUS
  Filled 2022-06-23: qty 4

## 2022-06-23 MED ORDER — ACETAMINOPHEN 160 MG/5ML PO SOLN
650.0000 mg | Freq: Four times a day (QID) | ORAL | Status: DC | PRN
  Filled 2022-06-23 (×2): qty 20.3

## 2022-06-23 MED ORDER — QUETIAPINE FUMARATE 25 MG PO TABS
25.0000 mg | ORAL_TABLET | Freq: Every day | ORAL | Status: DC
  Administered 2022-06-23 – 2022-06-24 (×2): 25 mg
  Filled 2022-06-23 (×3): qty 1

## 2022-06-23 MED ORDER — SENNA 8.6 MG PO TABS
1.0000 | ORAL_TABLET | Freq: Two times a day (BID) | ORAL | Status: DC
  Administered 2022-06-23 – 2022-06-24 (×3): 8.6 mg
  Filled 2022-06-23 (×5): qty 1

## 2022-06-23 MED ORDER — POLYETHYLENE GLYCOL 3350 17 G PO PACK
17.0000 g | PACK | Freq: Two times a day (BID) | ORAL | Status: DC
  Administered 2022-06-23 – 2022-06-24 (×3): 17 g
  Filled 2022-06-23 (×4): qty 1

## 2022-06-23 MED ORDER — ACETAMINOPHEN 500 MG PO TABS
1000.0000 mg | ORAL_TABLET | Freq: Once | ORAL | Status: AC
  Administered 2022-06-23: 1000 mg
  Filled 2022-06-23: qty 2

## 2022-06-23 MED ORDER — GABAPENTIN 600 MG PO TABS: 300.0000 mg | ORAL_TABLET | Freq: Three times a day (TID) | ORAL | Status: DC

## 2022-06-23 MED ORDER — PIPERACILLIN-TAZOBACTAM 3.375 G IVPB
3.3750 g | Freq: Three times a day (TID) | INTRAVENOUS | Status: DC
  Administered 2022-06-23 – 2022-06-24 (×3): 3.375 g via INTRAVENOUS
  Filled 2022-06-23 (×3): qty 50

## 2022-06-23 MED ORDER — SODIUM CHLORIDE 0.9 % IV SOLN: INTRAVENOUS | Status: DC | PRN

## 2022-06-23 MED ORDER — SENNA 8.6 MG PO TABS: 1.0000 | ORAL_TABLET | Freq: Two times a day (BID) | ORAL | Status: DC

## 2022-06-23 MED ORDER — ACETAMINOPHEN 325 MG PO TABS: 650.0000 mg | ORAL_TABLET | Freq: Once | ORAL | Status: DC | PRN

## 2022-06-23 NOTE — TOC Progression Note (Signed)
Transition of Care [facility]) - Progression Note    Patient Details  Name: Nicole Montgomery MRN: [id number] Date of Birth: 01/23/1941  Transition of [facility]) CM/SW Contact  Tom-Johnson, Renea Ee, RN Phone Number: 06/23/2022, 1:35 PM  Clinical Narrative:     Patient continues to be intubated and in restraint. Husband, Nicole Montgomery at bedside and very emotional as their wedding anniversary is approaching on January 26 th. Nicole Montgomery declined speaking with Spiritual care at this time. Requesting patient's son not to visit. CM inform Nicole Montgomery to notify nursing staff of his wishes as he is patient's POA. Nicole Montgomery states patient was independent with care prior to admission. States patient has a son who is confrontational and does not want him to visit patient. Nursing staff notified.  Patient has a cane and walker at home.  PCP is Nicole Collie, MD and uses [facility].  CM will continue to follow as patient progresses with care towards discharge.           Expected Discharge Plan and Services                                               Social Determinants of Health (SDOH) Interventions SDOH Screenings   Tobacco Use: Low Risk  (06/22/2022)    Readmission Risk Interventions     No data to display

## 2022-06-23 NOTE — Progress Notes (Signed)
Nutrition Follow-up  DOCUMENTATION CODES:   Non-severe (moderate) malnutrition in context of chronic illness  INTERVENTION:   Continue tube feeds via OG tube: - Vital 1.5 @ 50 ml/hr (1200 ml/day)  Tube feeding regimen at goal rate provides 1800 kcal, 81 grams of protein, and 917 ml of H2O.   - MVI with minerals daily per tube  - Thiamine 100 mg daily per tube x 5 days due to refeeding risk   NUTRITION DIAGNOSIS:   Moderate Malnutrition related to chronic illness (pancreatic adenocarcinoma s/p pancreatectomy) as evidenced by mild fat depletion, moderate muscle depletion. - remains applicable  GOAL:   Patient will meet greater than or equal to 90% of their needs - progressing, being met with TF at goal  MONITOR:   Vent status, Labs, Weight trends, TF tolerance, Skin  REASON FOR ASSESSMENT:   Ventilator, Consult Enteral/tube feeding initiation and management  ASSESSMENT:   82 year old female who presented to the ED on 1/02 after being found unresponsive on the toilet. PMH of T2DM, trigeminal neuralgia, previous pancreatic adenocarcinoma s/p pancreatectomy. Pt admitted with DKA, sepsis due to influenza A, AKI.  01/03 - intubated  Patient remains intubated on ventilator support this AM. TF infusing at goal rate of 16m/h. RN reports no issues with feeding regimen. No plans for extubation today.    MV: 9.4 L/min Temp (24hrs), Avg:100.1 F (37.8 C), Min:97.9 F (36.6 C), Max:101.1 F (38.4 C)  Propofol: 10.73 ml/hr (283kcal/d)   Intake/Output Summary (Last 24 hours) at 06/23/2022 1308 Last data filed at 06/23/2022 1131 Gross per 24 hour  Intake 2110.13 ml  Output 3650 ml  Net -1539.87 ml  Net IO Since Admission: 3,762.46 mL [06/23/22 1308]  Nutritionally Relevant Medications: Scheduled Meds:  docusate  100 mg Per Tube BID   famotidine  10 mg Per Tube Daily   furosemide  40 mg Intravenous Once   insulin aspart  0-20 Units Subcutaneous Q4H   insulin aspart  6  Units Subcutaneous Q4H   insulin detemir  15 Units Subcutaneous BID   multivitamin with minerals  1 tablet Per Tube Daily   polyethylene glycol  17 g Per Tube BID   senna  1 tablet Per Tube BID   Continuous Infusions:  feeding supplement (VITAL 1.5 CAL) 50 mL/hr at 06/23/22 1131   piperacillin-tazobactam (ZOSYN)  IV 12.5 mL/hr at 06/23/22 1131   propofol (DIPRIVAN) infusion 30 mcg/kg/min (06/23/22 1131)   Labs Reviewed: Na 147 BUN 54, creatinine 1.77 CBG ranges from 104-259 mg/dL over the last 24 hours  NUTRITION - FOCUSED PHYSICAL EXAM:  Flowsheet Row Most Recent Value  Orbital Region Mild depletion  Upper Arm Region Mild depletion  Thoracic and Lumbar Region Moderate depletion  Buccal Region Unable to assess  Temple Region Moderate depletion  Clavicle Bone Region Moderate depletion  Clavicle and Acromion Bone Region Moderate depletion  Scapular Bone Region Unable to assess  Dorsal Hand Mild depletion  Patellar Region Mild depletion  Anterior Thigh Region Moderate depletion  Posterior Calf Region Mild depletion  Edema (RD Assessment) Mild  [BUE]  Hair Reviewed  Eyes Reviewed  Mouth Reviewed  Skin Reviewed  Nails Reviewed   Diet Order:   Diet Order             Diet NPO time specified  Diet effective now                   EDUCATION NEEDS:   Not appropriate for education at this time  Skin:  Skin Assessment: Skin Integrity Issues: Stage I: L wrist Stage II: face Stage III: L arm x 2  Last BM:  no documented BM, bowel regimen in place  Height:   Ht Readings from Last 1 Encounters:  06/17/22 5' (1.524 m)    Weight:   Wt Readings from Last 1 Encounters:  06/22/22 65.7 kg    BMI:  Body mass index is 28.29 kg/m.  Estimated Nutritional Needs:   Kcal:  2010-0712  Protein:  80-95 grams  Fluid:  1.7-1.9 L    Ranell Patrick, RD, LDN Clinical Dietitian RD pager # available in [place]  After hours/weekend pager # available in [employer]

## 2022-06-23 NOTE — Progress Notes (Signed)
NAME:  Nicole Montgomery, MRN:  [id number], DOB:  [birth year], LOS: 6 ADMISSION DATE:  06/17/2022, CONSULTATION DATE:  06/23/22 REFERRING MD:  EDP, CHIEF COMPLAINT:  AMS   History of Present Illness:  Nicole Montgomery is a 82 y.o. F with PMH significant for Type 2 DM on Ozempic, previous pancreatic adenocarcinoma, status post pancreatectomy, trigeminal Neuralgia, HL who follows with PCP in [place] who presents with altered mental status.  Her husband is at the bedside and provides history-states that she began showing signs of confusion approximately one week ago.  She has had reduced appetite and barely been eating or drinking.  Her confusion worsened to the point she was not responsive on the toilet and so he called EMS who noted glucose >600.   He notes they cared for grandchildren last week who had the flu.  She also has been coughing and short of breath without fever, though had a fall from standing on 12/31.  She did not lose consciousness at that time and did not want to come to the hospital.   In the ED, she was hypertensive, tachycardic, hypoxic requiring [place] and agitated.  CTH was completed without acute findings, labs significant for glucose >1200, bicarb 12, gap 20, beta hydroxy 1.6, WBC 11.3, pH 7.9 and flu A+ with R-sided opacities on CXR.  Her husband states she may have missed her last ozempic injection secondary to her confusion.  PCCM consulted for admission given her AMS  Pertinent  Medical History   has a past medical history of DM (diabetes mellitus) ([place]), H/O splenectomy (2012), HTN (hypertension), Nephrolithiasis, Pancreatic cancer ([place]) (2012), and Sarcoid (2012).  Significant Hospital Events: Including procedures, antibiotic start and stop dates in addition to other pertinent events   1/2 presented with AMS, flu + and in DKA with significant agitation requiring precedex.  Started on antibiotic.  1/3 intubated for sedation needs and oxygen requirement. 1/4 transition from insulin drip  to subQ  1/6 restarted on insulin overnight for hyperglycemia but anion gap remains closed.  Mild vent dyssynchrony with inability to tolerate SBT 1/7 no acute events overnight, glucose remains elevated will need up titration of insulin management.  Tolerating SBT trial this a.m. diuresed almost 5 L with use of diuretics  Interim History / Subjective:  Remains sedated. Fio2 increased to 40 to 55% overnight, now at 50%.  Objective   Blood pressure 113/61, pulse (!) 103, temperature 97.9 F (36.6 C), temperature source Oral, resp. rate (!) 26, height 5' (1.524 m), weight 65.7 kg, SpO2 92 %.    Vent Mode: PRVC FiO2 (%):  [40 %-55 %] 50 % Set Rate:  [20 bmp-24 bmp] 24 bmp Vt Set:  [360 mL] 360 mL PEEP:  [5 cmH20-6 cmH20] 5 cmH20 Pressure Support:  [10 cmH20] 10 cmH20   Intake/Output Summary (Last 24 hours) at 06/23/2022 0657 Last data filed at 06/23/2022 0600 Gross per 24 hour  Intake 892.74 ml  Output 4275 ml  Net -3382.26 ml    Filed Weights   06/19/22 0500 06/21/22 0419 06/22/22 0339  Weight: 59.6 kg 61.9 kg 65.7 kg   Physical exam General: Acute on chronic ill-appearing elderly female lying in bed in mechanical ventilation, in no acute distress HEENT: ETT in place Neuro: unresponsive CV: tachycardic PULM: mechanically ventilated on 50% FiO2 Extremities: warm/dry, no LE edema  Resolved Hospital Problem list   Lactic acidosis Hypokalemia  Assessment & Plan:  Type 2 DM with acute DKA Pancreatic adenocarcinoma s/p pancreatectomy (2012) PTA listed  as taking Ozempic. On insulin regimen with improvement of CBG.  P: - SSI on resistant scale - levemir 15 units BID - novolog 6 units q4h - Continue tube feed coverage - Closely monitor and replace electrolytes as needed - Monitor renal function - Pending A1c  Acute Encephalopathy -Seems prolonged for about a week prior to admission-likely multifactorial from DKA, acute infection. MRI brain unremarkable. History of Tregeminal  Neuralgia  P: -home amitriptyline, duloxetine, gabpentin -wean sedation as able -aspiration and delirium precautions  Acute hypoxic respiratory failure secondary to Influenza A with high suspicion of superimposed bacterial pneumonia -CXR showed decreasing bilateral airspace disease -Procalcitonin 4.51>2.54 -urine strep pneumo negative P: -Wean PEEP and FiO2 for sats greater than 90%. -Head of bed elevated 30 degrees. -Follow intermittent chest x-ray and ABG.   -SAT/SBT as tolerated, mentation preclude extubation  -VAP bundle in place  -PAD protocol with propofol and fentanyl -Pending cultures, legionella urine ag -Continue Tamiflu -Continue azithromycin and ceftriaxone -Consider stress dose steroids but this will make diabetes management more difficult  Acute Kidney Injury  In the setting of above.  Creatinine trending down 1.77 from 2.16. Admission was 1.57. On lasix after 9+ liters positive. Has good urine output.  P: -improving with diuresis -trend BMP -avoid nephrotoxic meds  Moderate protein energy malnutrition P: -Continue tube feeds  Best Practice (right click and "Reselect all SmartList Selections" daily)   Diet/type: tubefeeds, NPO, and NPO w/ meds via tube DVT prophylaxis: prophylactic heparin  GI prophylaxis: H2B Lines: N/A Foley:  Yes, and it is still needed Code Status:  full code Last date of multidisciplinary goals of care discussion Nicole Montgomery was updated, confirms full code]    Angelique Blonder, DO

## 2022-06-23 NOTE — Progress Notes (Signed)
[facility] Progress Note Patient Name: Nicole Montgomery DOB: April 05, 1941 MRN: [id number]   Date of Service  06/23/2022  HPI/Events of Note  Notified of fever On Tamiflu and Zosyn Cultures NGTD AST 66  eICU Interventions  Ordered Tylenol prn not to exceed 2 g in 24 hours     Intervention Category Minor Interventions: Routine modifications to care plan (e.g. PRN medications for pain, fever)  Shona Needles Capricia Serda 06/23/2022, 10:43 PM

## 2022-06-23 NOTE — Progress Notes (Signed)
Tube placed on 1/3

## 2022-06-24 DIAGNOSIS — J9601 Acute respiratory failure with hypoxia: Secondary | ICD-10-CM | POA: Diagnosis not present

## 2022-06-24 DIAGNOSIS — G9341 Metabolic encephalopathy: Secondary | ICD-10-CM | POA: Diagnosis not present

## 2022-06-24 LAB — COMPREHENSIVE METABOLIC PANEL
ALT: 29 U/L (ref 0–44)
AST: 46 U/L — ABNORMAL HIGH (ref 15–41)
Albumin: 1.5 g/dL — ABNORMAL LOW (ref 3.5–5.0)
Alkaline Phosphatase: 256 U/L — ABNORMAL HIGH (ref 38–126)
Anion gap: 13 (ref 5–15)
BUN: 64 mg/dL — ABNORMAL HIGH (ref 8–23)
CO2: 38 mmol/L — ABNORMAL HIGH (ref 22–32)
Calcium: 8.3 mg/dL — ABNORMAL LOW (ref 8.9–10.3)
Chloride: 99 mmol/L (ref 98–111)
Creatinine, Ser: 1.92 mg/dL — ABNORMAL HIGH (ref 0.44–1.00)
GFR, Estimated: 26 mL/min — ABNORMAL LOW (ref >60)
Glucose, Bld: 87 mg/dL (ref 70–99)
Potassium: 3.7 mmol/L (ref 3.5–5.1)
Sodium: 150 mmol/L — ABNORMAL HIGH (ref 135–145)
Total Bilirubin: 0.8 mg/dL (ref 0.3–1.2)
Total Protein: 6.1 g/dL — ABNORMAL LOW (ref 6.5–8.1)

## 2022-06-24 LAB — GLUCOSE, CAPILLARY
Glucose-Capillary: 110 mg/dL — ABNORMAL HIGH (ref 70–99)
Glucose-Capillary: 117 mg/dL — ABNORMAL HIGH (ref 70–99)
Glucose-Capillary: 135 mg/dL — ABNORMAL HIGH (ref 70–99)
Glucose-Capillary: 144 mg/dL — ABNORMAL HIGH (ref 70–99)
Glucose-Capillary: 206 mg/dL — ABNORMAL HIGH (ref 70–99)
Glucose-Capillary: 96 mg/dL (ref 70–99)

## 2022-06-24 LAB — MAGNESIUM: Magnesium: 2.1 mg/dL (ref 1.7–2.4)

## 2022-06-24 LAB — CBC
HCT: 33.8 % — ABNORMAL LOW (ref 36.0–46.0)
Hemoglobin: 11.2 g/dL — ABNORMAL LOW (ref 12.0–15.0)
MCH: 30.2 pg (ref 26.0–34.0)
MCHC: 33.1 g/dL (ref 30.0–36.0)
MCV: 91.1 fL (ref 80.0–100.0)
Platelets: 351 10*3/uL (ref 150–400)
RBC: 3.71 MIL/uL — ABNORMAL LOW (ref 3.87–5.11)
RDW: 15.7 % — ABNORMAL HIGH (ref 11.5–15.5)
WBC: 20.2 10*3/uL — ABNORMAL HIGH (ref 4.0–10.5)
nRBC: 3.9 % — ABNORMAL HIGH (ref 0.0–0.2)

## 2022-06-24 LAB — TRIGLYCERIDES: Triglycerides: 229 mg/dL — ABNORMAL HIGH (ref <150)

## 2022-06-24 LAB — PHOSPHORUS: Phosphorus: 3.5 mg/dL (ref 2.5–4.6)

## 2022-06-24 MED ORDER — LIP MEDEX EX OINT
TOPICAL_OINTMENT | CUTANEOUS | Status: DC | PRN
  Administered 2022-06-24: 75 via TOPICAL
  Filled 2022-06-24: qty 7

## 2022-06-24 MED ORDER — FREE WATER
200.0000 mL | Status: AC
  Administered 2022-06-24 – 2022-06-25 (×6): 200 mL

## 2022-06-24 MED ORDER — INSULIN ASPART 100 UNIT/ML IJ SOLN
3.0000 [IU] | INTRAMUSCULAR | Status: DC
  Administered 2022-06-24 – 2022-06-26 (×12): 3 [IU] via SUBCUTANEOUS

## 2022-06-24 MED ORDER — PIPERACILLIN-TAZOBACTAM IN DEX 2-0.25 GM/50ML IV SOLN
2.2500 g | Freq: Three times a day (TID) | INTRAVENOUS | Status: DC
  Administered 2022-06-24 – 2022-06-26 (×6): 2.25 g via INTRAVENOUS
  Filled 2022-06-24 (×7): qty 50

## 2022-06-24 MED ORDER — DEXMEDETOMIDINE HCL IN NACL 400 MCG/100ML IV SOLN
0.0000 ug/kg/h | INTRAVENOUS | Status: DC
  Administered 2022-06-24: 0.4 ug/kg/h via INTRAVENOUS
  Administered 2022-06-25: 0.8 ug/kg/h via INTRAVENOUS
  Administered 2022-06-25: 0.7 ug/kg/h via INTRAVENOUS
  Administered 2022-06-25: 0.9 ug/kg/h via INTRAVENOUS
  Administered 2022-06-26: 0.7 ug/kg/h via INTRAVENOUS
  Administered 2022-06-26: 0.8 ug/kg/h via INTRAVENOUS
  Administered 2022-06-26: 0.7 ug/kg/h via INTRAVENOUS
  Administered 2022-06-27: 0.6 ug/kg/h via INTRAVENOUS
  Filled 2022-06-24 (×9): qty 100

## 2022-06-24 MED ORDER — FENTANYL CITRATE PF 50 MCG/ML IJ SOSY
25.0000 ug | PREFILLED_SYRINGE | INTRAMUSCULAR | Status: AC | PRN
  Administered 2022-06-24 (×3): 25 ug via INTRAVENOUS
  Filled 2022-06-24: qty 1

## 2022-06-24 MED ORDER — FENTANYL CITRATE PF 50 MCG/ML IJ SOSY: 25.0000 ug | PREFILLED_SYRINGE | INTRAMUSCULAR | Status: DC | PRN

## 2022-06-24 NOTE — Plan of Care (Signed)
Patient received on PRVC ventilation as charted. No weaning done per MD. FiO2 increased form 50 to 60%.

## 2022-06-24 NOTE — Progress Notes (Addendum)
NAME:  [name], MRN:  [id number], DOB:  Jun 17, 1940, LOS: 7 ADMISSION DATE:  06/17/2022, CONSULTATION DATE:  06/24/22 REFERRING MD:  EDP, CHIEF COMPLAINT:  AMS   History of Present Illness:  Nicole Montgomery is a 82 y.o. F with PMH significant for Type 2 DM on Ozempic, previous pancreatic adenocarcinoma, status post pancreatectomy, trigeminal Neuralgia, HL who follows with PCP in [place] who presents with altered mental status.  Her husband is at the bedside and provides history-states that she began showing signs of confusion approximately one week ago.  She has had reduced appetite and barely been eating or drinking.  Her confusion worsened to the point she was not responsive on the toilet and so he called EMS who noted glucose >600.   He notes they cared for grandchildren last week who had the flu.  She also has been coughing and short of breath without fever, though had a fall from standing on 12/31.  She did not lose consciousness at that time and did not want to come to the hospital.   In the ED, she was hypertensive, tachycardic, hypoxic requiring [place] and agitated.  CTH was completed without acute findings, labs significant for glucose >1200, bicarb 12, gap 20, beta hydroxy 1.6, WBC 11.3, pH 7.9 and flu A+ with R-sided opacities on CXR.  Her husband states she may have missed her last ozempic injection secondary to her confusion.  PCCM consulted for admission given her AMS  Pertinent  Medical History   has a past medical history of DM (diabetes mellitus) ([place]), H/O splenectomy (2012), HTN (hypertension), Nephrolithiasis, Pancreatic cancer ([place]) (2012), and Sarcoid (2012).  Significant Hospital Events: Including procedures, antibiotic start and stop dates in addition to other pertinent events   1/2 presented with AMS, flu + and in DKA with significant agitation requiring precedex.  Started on antibiotic.  1/3 intubated for sedation needs and oxygen requirement. 1/4 transition from insulin drip  to subQ  1/6 restarted on insulin overnight for hyperglycemia but anion gap remains closed.  Mild vent dyssynchrony with inability to tolerate SBT 1/7 no acute events overnight, glucose remains elevated will need up titration of insulin management.  Tolerating SBT trial this a.m. diuresed almost 5 L with use of diuretics 1/8 start Zosyn   Interim History / Subjective:  FiO2 at 40% from 50% now. Tmax overnight 100.8.   Objective   Blood pressure 98/63, pulse 96, temperature 99.3 F (37.4 C), resp. rate (!) 26, height 5' (1.524 m), weight 55.5 kg, SpO2 95 %.    Vent Mode: PRVC FiO2 (%):  [40 %-50 %] 40 % Set Rate:  [24 bmp] 24 bmp Vt Set:  [360 mL] 360 mL PEEP:  [5 cmH20] 5 cmH20 Pressure Support:  [10 cmH20] 10 cmH20   Intake/Output Summary (Last 24 hours) at 06/24/2022 0650 Last data filed at 06/24/2022 0600 Gross per 24 hour  Intake 1980.43 ml  Output 2350 ml  Net -369.57 ml    Filed Weights   06/21/22 0419 06/22/22 0339 06/24/22 0500  Weight: 61.9 kg 65.7 kg 55.5 kg   Physical exam General: ill-appearing female, laying in bed with mechanical ventilation, in no acute distress HEENT: ETT in place Neuro: sedated, grimaces to pain CV: RRR PULM: coarse breath sounds to auscultation, mechanically ventilated on 40% FiO2 Extremities: warm and dry Skin: diffuse papular rash over body unchanged from yesterday  WBC 20 improved Hgb 11.2 relatively unchanged  Resolved Hospital Problem list   DKA Lactic acidosis Hypokalemia  Assessment & Plan:  Type 2 DM Pancreatic adenocarcinoma s/p pancreatectomy (2012) PTA listed as taking Ozempic. CBG better controlled with insulin regimen. A1c 10.1%.  P: - SSI resistant scale - levemir 15 units BID - novolog 6 units q4h - CBG goal 140-180 - monitor and replace electrolytes as needed  Acute Encephalopathy -Seems prolonged for about a week prior to admission-likely multifactorial from DKA, acute infection. MRI brain  unremarkable. History of Tregeminal Neuralgia  P: -home amitriptyline, duloxetine, gabpentin -wean sedation as able -aspiration and delirium precautions  Acute hypoxic respiratory failure secondary to Influenza A with high suspicion of superimposed bacterial pneumonia and volume overload Last CXR showed decreasing bilateral airspace disease.  Trach culture: G - rods and G + cocci Blood culture: NG P: -Wean PEEP and FiO2 for sats greater than 90%. -Head of bed elevated 30 degrees. -Follow intermittent chest x-ray and ABG.   -SAT/SBT as tolerated, mentation preclude extubation  -VAP bundle in place  -PAD protocol with propofol and fentanyl -completed tamiflu -switched to Zosyn yesterday given preliminary culture results -diuresis with lasix   Acute Kidney Injury  In the setting of above. Admission was 1.57. Has been net positive, renal function improved with diuresis. Received lasix 40 mg IV x 2 yesterday. Urine output 2350 yesterday, but remains net positive still.  P: -consider continuing diuresis if needed -trend BMP -avoid nephrotoxic meds  Moderate protein energy malnutrition P: -Continue tube feeds  Best Practice (right click and "Reselect all SmartList Selections" daily)   Diet/type: tubefeeds, NPO, and NPO w/ meds via tube DVT prophylaxis: prophylactic heparin  GI prophylaxis: H2B Lines: N/A Foley:  Yes, and it is still needed Code Status:  full code Last date of multidisciplinary goals of care discussion [will update husband]    Nicole Blonder, DO

## 2022-06-25 ENCOUNTER — Telehealth: Payer: Self-pay | Admitting: Pharmacy Technician

## 2022-06-25 DIAGNOSIS — J9601 Acute respiratory failure with hypoxia: Secondary | ICD-10-CM | POA: Diagnosis not present

## 2022-06-25 DIAGNOSIS — Z596 Low income: Secondary | ICD-10-CM

## 2022-06-25 DIAGNOSIS — G9341 Metabolic encephalopathy: Secondary | ICD-10-CM | POA: Diagnosis not present

## 2022-06-25 LAB — GLUCOSE, CAPILLARY
Glucose-Capillary: 157 mg/dL — ABNORMAL HIGH (ref 70–99)
Glucose-Capillary: 175 mg/dL — ABNORMAL HIGH (ref 70–99)
Glucose-Capillary: 221 mg/dL — ABNORMAL HIGH (ref 70–99)
Glucose-Capillary: 227 mg/dL — ABNORMAL HIGH (ref 70–99)
Glucose-Capillary: 236 mg/dL — ABNORMAL HIGH (ref 70–99)
Glucose-Capillary: 241 mg/dL — ABNORMAL HIGH (ref 70–99)
Glucose-Capillary: 282 mg/dL — ABNORMAL HIGH (ref 70–99)
Glucose-Capillary: 73 mg/dL (ref 70–99)

## 2022-06-25 LAB — CBC
HCT: 32.7 % — ABNORMAL LOW (ref 36.0–46.0)
Hemoglobin: 10.8 g/dL — ABNORMAL LOW (ref 12.0–15.0)
MCH: 30.6 pg (ref 26.0–34.0)
MCHC: 33 g/dL (ref 30.0–36.0)
MCV: 92.6 fL (ref 80.0–100.0)
Platelets: 379 10*3/uL (ref 150–400)
RBC: 3.53 MIL/uL — ABNORMAL LOW (ref 3.87–5.11)
RDW: 16 % — ABNORMAL HIGH (ref 11.5–15.5)
WBC: 20.8 10*3/uL — ABNORMAL HIGH (ref 4.0–10.5)
nRBC: 1.5 % — ABNORMAL HIGH (ref 0.0–0.2)

## 2022-06-25 LAB — BASIC METABOLIC PANEL
Anion gap: 13 (ref 5–15)
BUN: 56 mg/dL — ABNORMAL HIGH (ref 8–23)
CO2: 36 mmol/L — ABNORMAL HIGH (ref 22–32)
Calcium: 8.2 mg/dL — ABNORMAL LOW (ref 8.9–10.3)
Chloride: 98 mmol/L (ref 98–111)
Creatinine, Ser: 1.6 mg/dL — ABNORMAL HIGH (ref 0.44–1.00)
GFR, Estimated: 32 mL/min — ABNORMAL LOW (ref >60)
Glucose, Bld: 105 mg/dL — ABNORMAL HIGH (ref 70–99)
Potassium: 3.3 mmol/L — ABNORMAL LOW (ref 3.5–5.1)
Sodium: 147 mmol/L — ABNORMAL HIGH (ref 135–145)

## 2022-06-25 LAB — MAGNESIUM: Magnesium: 2.1 mg/dL (ref 1.7–2.4)

## 2022-06-25 MED ORDER — POLYETHYLENE GLYCOL 3350 17 G PO PACK: 17.0000 g | PACK | Freq: Every day | ORAL | Status: DC

## 2022-06-25 MED ORDER — INSULIN DETEMIR 100 UNIT/ML ~~LOC~~ SOLN
10.0000 [IU] | Freq: Two times a day (BID) | SUBCUTANEOUS | Status: DC
  Administered 2022-06-25 – 2022-06-26 (×3): 10 [IU] via SUBCUTANEOUS
  Filled 2022-06-25 (×4): qty 0.1

## 2022-06-25 MED ORDER — QUETIAPINE FUMARATE 50 MG PO TABS
50.0000 mg | ORAL_TABLET | Freq: Every day | ORAL | Status: DC
  Administered 2022-06-25: 50 mg
  Filled 2022-06-25: qty 1

## 2022-06-25 MED ORDER — POTASSIUM CHLORIDE 20 MEQ PO PACK
20.0000 meq | PACK | ORAL | Status: AC
  Administered 2022-06-25 (×2): 20 meq
  Filled 2022-06-25 (×2): qty 1

## 2022-06-25 MED ORDER — FREE WATER
200.0000 mL | Status: AC
  Administered 2022-06-25 – 2022-06-26 (×6): 200 mL

## 2022-06-25 MED ORDER — WHITE PETROLATUM EX OINT
TOPICAL_OINTMENT | CUTANEOUS | Status: DC | PRN
  Filled 2022-06-25: qty 28.35

## 2022-06-25 MED ORDER — INSULIN DETEMIR 100 UNIT/ML ~~LOC~~ SOLN
10.0000 [IU] | Freq: Two times a day (BID) | SUBCUTANEOUS | Status: DC
  Filled 2022-06-25: qty 0.1

## 2022-06-25 MED ORDER — POTASSIUM CHLORIDE 10 MEQ/100ML IV SOLN
10.0000 meq | INTRAVENOUS | Status: AC
  Administered 2022-06-25 (×4): 10 meq via INTRAVENOUS
  Filled 2022-06-25 (×4): qty 100

## 2022-06-25 NOTE — Progress Notes (Signed)
[facility] ADULT ICU REPLACEMENT PROTOCOL   The patient does apply for the [facility] Adult ICU Electrolyte Replacment Protocol based on the criteria listed below:   1.Exclusion criteria: TCTS, ECMO, Dialysis, and Myasthenia Gravis patients 2. Is GFR >/= 30 ml/min? Yes.    Patient's GFR today is 32 3. Is SCr </= 2? Yes.   Patient's SCr is 1.60 mg/dL 4. Did SCr increase >/= 0.5 in 24 hours? No. 5.Pt's weight >40kg  Yes.   6. Abnormal electrolyte(s): potassium 3.3  7. Electrolytes replaced per protocol 8.  Call MD STAT for K+ </= 2.5, Phos </= 1, or Mag </= 1 Physician:  n/a  Nicole Montgomery 06/25/2022 5:50 AM

## 2022-06-25 NOTE — Progress Notes (Signed)
NAME:  SERI KIMMER, MRN:  [id number], DOB:  October 04, 1940, LOS: 8 ADMISSION DATE:  06/17/2022, CONSULTATION DATE:  06/25/22 REFERRING MD:  EDP, CHIEF COMPLAINT:  AMS   History of Present Illness:  Jamaica Inthavong is a 82 y.o. F with PMH significant for Type 2 DM on Ozempic, previous pancreatic adenocarcinoma, status post pancreatectomy, trigeminal Neuralgia, HL who follows with PCP in [place] who presents with altered mental status.  Her husband is at the bedside and provides history-states that she began showing signs of confusion approximately one week ago.  She has had reduced appetite and barely been eating or drinking.  Her confusion worsened to the point she was not responsive on the toilet and so he called EMS who noted glucose >600.   He notes they cared for grandchildren last week who had the flu.  She also has been coughing and short of breath without fever, though had a fall from standing on 12/31.  She did not lose consciousness at that time and did not want to come to the hospital.   In the ED, she was hypertensive, tachycardic, hypoxic requiring [place] and agitated.  CTH was completed without acute findings, labs significant for glucose >1200, bicarb 12, gap 20, beta hydroxy 1.6, WBC 11.3, pH 7.9 and flu A+ with R-sided opacities on CXR.  Her husband states she may have missed her last ozempic injection secondary to her confusion.  PCCM consulted for admission given her AMS  Pertinent  Medical History   has a past medical history of DM (diabetes mellitus) ([place]), H/O splenectomy (2012), HTN (hypertension), Nephrolithiasis, Pancreatic cancer ([place]) (2012), and Sarcoid (2012).  Significant Hospital Events: Including procedures, antibiotic start and stop dates in addition to other pertinent events   1/2 presented with AMS, flu + and in DKA with significant agitation requiring precedex.  Started on antibiotic.  1/3 intubated for sedation needs and oxygen requirement. 1/4 transition from insulin drip  to subQ  1/6 restarted on insulin overnight for hyperglycemia but anion gap remains closed.  Mild vent dyssynchrony with inability to tolerate SBT 1/7 no acute events overnight, glucose remains elevated will need up titration of insulin management.  Tolerating SBT trial this a.m. diuresed almost 5 L with use of diuretics 1/8 start Zosyn   Interim History / Subjective:  FiO2 increased to 60%. On fentanyl and precedex. Off of propofol yesterday. More awake this morning.   Objective   Blood pressure (!) 141/79, pulse 89, temperature 97.8 F (36.6 C), temperature source Axillary, resp. rate (!) 23, height 5' (1.524 m), weight 58.6 kg, SpO2 98 %.    Vent Mode: PRVC FiO2 (%):  [50 %-70 %] 60 % Set Rate:  [24 bmp] 24 bmp Vt Set:  [360 mL] 360 mL PEEP:  [5 cmH20-8 cmH20] 6 cmH20 Plateau Pressure:  [12 cmH20] 12 cmH20   Intake/Output Summary (Last 24 hours) at 06/25/2022 0640 Last data filed at 06/25/2022 2440 Gross per 24 hour  Intake 2109.69 ml  Output 1517 ml  Net 592.69 ml    Filed Weights   06/22/22 0339 06/24/22 0500 06/25/22 0500  Weight: 65.7 kg 55.5 kg 58.6 kg   Physical exam General: awake, ill-appearing female, lying in bed with mechanical ventilation, in no acute distress  HEENT: ETT in place Neuro: awake, withdraws with pain, not able to follow commands CV: regular rate PULM: non-labored breathing, mechanically ventilated on 60% FiO2 Skin: warm and dry, erythematous lesions on LE which are chronic per husband  WBC 20.8  unchanged from yesterday Hgb 10.8 relatively unchanged  Na 147 improved K 3.3 decreased Mag 2.1, unchanged BUN/Cre 56/1.6, improved from 64/1.92  Resolved Hospital Problem list   DKA Lactic acidosis Hypokalemia  Assessment & Plan:  Type 2 DM Pancreatic adenocarcinoma s/p pancreatectomy (2012) A1c 10.1%. PTA listed as taking Ozempic. CBG controlled with insulin regimen.  P: -trend CBG, adjust insulin regimen if needed -levemir 15 units  BID -novolog decreased to 3 units q4h yesterday -SSI resistant scale -CBG goal 140-180  Acute Encephalopathy History of Tregeminal Neuralgia  Seems prolonged for about a week prior to admission-likely multifactorial from DKA, acute infection. MRI brain unremarkable. P: -home amitriptyline and duloxetine, stopped home gabapentin now more awake -continue seroquel qhs -wean sedation as able -aspiration and delirium precautions  Acute hypoxic respiratory failure secondary to Influenza A with superimposed bacterial pneumonia and volume overload Chronic hypercarbic respiratory failure Last CXR showed decreasing bilateral airspace disease.  1/7 Trach culture: G - rods and G + cocci > few staph epidermidis, pending 1/3 Blood culture: NG P: -Wean PEEP and FiO2 for sats greater than 90%. -Head of bed elevated 30 degrees. -Follow intermittent chest x-ray and ABG.   -Hold SBT today -VAP bundle in place -PAD protocol with fentanyl and precedex, attempt to wean fentanyl  -continue Zosyn, pending susceptibilities   AKI In the setting of above. Creatinine improving, was up to 2.23 during course.  P: -trend BMP -monitor urine output -consider diuresis if needed -avoid nephrotoxic meds  Hypernatremia P: -Na trend down -FWF 200 q4h x 24 hours started yesterday  Normocytic Anemia Hgb steady around 10-11.  P: -CBC -monitor for signs of bleeding  Moderate protein energy malnutrition P: -tube feeds  Best Practice (right click and "Reselect all SmartList Selections" daily)   Diet/type: tubefeeds, NPO, and NPO w/ meds via tube DVT prophylaxis: prophylactic heparin  GI prophylaxis: H2B Lines: N/A Foley:  Yes, and it is still needed Code Status:  full code Last date of multidisciplinary goals of care discussion [continue to update husband]    Angelique Blonder, DO

## 2022-06-25 NOTE — Progress Notes (Signed)
[facility])                                            [place] Team    06/25/2022  Nicole Montgomery 1941/05/09 [id number]  Care coordination call placed to [name] in regard to [name] application.  Spoke to [name] who informs patient is APPROVED 06/25/22-05/3123. She informs medication will ship to prescribing provider's office based on last fill in 2023.  [name] P. [name], [name]  [phone]

## 2022-06-26 DIAGNOSIS — J9601 Acute respiratory failure with hypoxia: Secondary | ICD-10-CM | POA: Diagnosis not present

## 2022-06-26 DIAGNOSIS — G9341 Metabolic encephalopathy: Secondary | ICD-10-CM | POA: Diagnosis not present

## 2022-06-26 LAB — CBC WITH DIFFERENTIAL/PLATELET
Abs Immature Granulocytes: 0.2 10*3/uL — ABNORMAL HIGH (ref 0.00–0.07)
Basophils Absolute: 0 10*3/uL (ref 0.0–0.1)
Basophils Relative: 0 %
Eosinophils Absolute: 0.1 10*3/uL (ref 0.0–0.5)
Eosinophils Relative: 0 %
HCT: 32 % — ABNORMAL LOW (ref 36.0–46.0)
Hemoglobin: 10 g/dL — ABNORMAL LOW (ref 12.0–15.0)
Immature Granulocytes: 1 %
Lymphocytes Relative: 8 %
Lymphs Abs: 1.6 10*3/uL (ref 0.7–4.0)
MCH: 30.1 pg (ref 26.0–34.0)
MCHC: 31.3 g/dL (ref 30.0–36.0)
MCV: 96.4 fL (ref 80.0–100.0)
Monocytes Absolute: 0.7 10*3/uL (ref 0.1–1.0)
Monocytes Relative: 4 %
Neutro Abs: 16.6 10*3/uL — ABNORMAL HIGH (ref 1.7–7.7)
Neutrophils Relative %: 87 %
RBC: 3.32 MIL/uL — ABNORMAL LOW (ref 3.87–5.11)
RDW: 15.8 % — ABNORMAL HIGH (ref 11.5–15.5)
WBC: 19.1 10*3/uL — ABNORMAL HIGH (ref 4.0–10.5)
nRBC: 0.9 % — ABNORMAL HIGH (ref 0.0–0.2)

## 2022-06-26 LAB — GLUCOSE, CAPILLARY
Glucose-Capillary: 116 mg/dL — ABNORMAL HIGH (ref 70–99)
Glucose-Capillary: 177 mg/dL — ABNORMAL HIGH (ref 70–99)
Glucose-Capillary: 224 mg/dL — ABNORMAL HIGH (ref 70–99)
Glucose-Capillary: 234 mg/dL — ABNORMAL HIGH (ref 70–99)
Glucose-Capillary: 55 mg/dL — ABNORMAL LOW (ref 70–99)
Glucose-Capillary: 57 mg/dL — ABNORMAL LOW (ref 70–99)
Glucose-Capillary: 65 mg/dL — ABNORMAL LOW (ref 70–99)
Glucose-Capillary: 81 mg/dL (ref 70–99)
Glucose-Capillary: 85 mg/dL (ref 70–99)
Glucose-Capillary: 96 mg/dL (ref 70–99)

## 2022-06-26 LAB — COMPREHENSIVE METABOLIC PANEL
ALT: 19 U/L (ref 0–44)
AST: 25 U/L (ref 15–41)
Albumin: <1.5 g/dL — ABNORMAL LOW (ref 3.5–5.0)
Alkaline Phosphatase: 189 U/L — ABNORMAL HIGH (ref 38–126)
Anion gap: 11 (ref 5–15)
BUN: 46 mg/dL — ABNORMAL HIGH (ref 8–23)
CO2: 31 mmol/L (ref 22–32)
Calcium: 8.2 mg/dL — ABNORMAL LOW (ref 8.9–10.3)
Chloride: 103 mmol/L (ref 98–111)
Creatinine, Ser: 1.28 mg/dL — ABNORMAL HIGH (ref 0.44–1.00)
GFR, Estimated: 42 mL/min — ABNORMAL LOW (ref >60)
Glucose, Bld: 135 mg/dL — ABNORMAL HIGH (ref 70–99)
Potassium: 3.9 mmol/L (ref 3.5–5.1)
Sodium: 145 mmol/L (ref 135–145)
Total Bilirubin: 0.5 mg/dL (ref 0.3–1.2)
Total Protein: 5.8 g/dL — ABNORMAL LOW (ref 6.5–8.1)

## 2022-06-26 LAB — BASIC METABOLIC PANEL
Anion gap: 11 (ref 5–15)
BUN: 42 mg/dL — ABNORMAL HIGH (ref 8–23)
CO2: 31 mmol/L (ref 22–32)
Calcium: 8.4 mg/dL — ABNORMAL LOW (ref 8.9–10.3)
Chloride: 104 mmol/L (ref 98–111)
Creatinine, Ser: 1.22 mg/dL — ABNORMAL HIGH (ref 0.44–1.00)
GFR, Estimated: 45 mL/min — ABNORMAL LOW (ref >60)
Glucose, Bld: 44 mg/dL — CL (ref 70–99)
Potassium: 3.7 mmol/L (ref 3.5–5.1)
Sodium: 146 mmol/L — ABNORMAL HIGH (ref 135–145)

## 2022-06-26 LAB — CULTURE, RESPIRATORY W GRAM STAIN

## 2022-06-26 LAB — LEGIONELLA PNEUMOPHILA SEROGP 1 UR AG: L. pneumophila Serogp 1 Ur Ag: NEGATIVE

## 2022-06-26 MED ORDER — FUROSEMIDE 10 MG/ML IJ SOLN
20.0000 mg | Freq: Once | INTRAMUSCULAR | Status: AC
  Administered 2022-06-26: 20 mg via INTRAVENOUS
  Filled 2022-06-26: qty 2

## 2022-06-26 MED ORDER — DEXTROSE 50 % IV SOLN: 25.0000 mL | Freq: Once | INTRAVENOUS | Status: AC

## 2022-06-26 MED ORDER — PIPERACILLIN-TAZOBACTAM 3.375 G IVPB
3.3750 g | Freq: Three times a day (TID) | INTRAVENOUS | Status: AC
  Administered 2022-06-26 – 2022-06-27 (×5): 3.375 g via INTRAVENOUS
  Filled 2022-06-26 (×5): qty 50

## 2022-06-26 MED ORDER — QUETIAPINE FUMARATE 25 MG PO TABS
25.0000 mg | ORAL_TABLET | Freq: Every day | ORAL | Status: DC
  Administered 2022-06-27 – 2022-06-28 (×2): 25 mg
  Filled 2022-06-26 (×2): qty 1

## 2022-06-26 MED ORDER — INSULIN ASPART 100 UNIT/ML IJ SOLN
0.0000 [IU] | INTRAMUSCULAR | Status: DC
  Administered 2022-06-27: 11 [IU] via SUBCUTANEOUS
  Administered 2022-06-27: 4 [IU] via SUBCUTANEOUS
  Administered 2022-06-27: 7 [IU] via SUBCUTANEOUS
  Administered 2022-06-27: 3 [IU] via SUBCUTANEOUS
  Administered 2022-06-28 (×3): 7 [IU] via SUBCUTANEOUS
  Administered 2022-06-28 (×2): 11 [IU] via SUBCUTANEOUS
  Administered 2022-06-28: 7 [IU] via SUBCUTANEOUS
  Administered 2022-06-29: 11 [IU] via SUBCUTANEOUS
  Administered 2022-06-29: 20 [IU] via SUBCUTANEOUS
  Administered 2022-06-29: 15 [IU] via SUBCUTANEOUS
  Administered 2022-06-29 (×2): 4 [IU] via SUBCUTANEOUS
  Administered 2022-06-30: 11 [IU] via SUBCUTANEOUS
  Administered 2022-06-30 (×2): 7 [IU] via SUBCUTANEOUS

## 2022-06-26 MED ORDER — DEXTROSE 50 % IV SOLN
INTRAVENOUS | Status: AC
  Administered 2022-06-26: 25 mL via INTRAVENOUS
  Filled 2022-06-26: qty 50

## 2022-06-26 MED ORDER — FREE WATER
200.0000 mL | Status: AC
  Administered 2022-06-27 (×2): 200 mL

## 2022-06-26 MED ORDER — POTASSIUM CHLORIDE 20 MEQ PO PACK
20.0000 meq | PACK | Freq: Once | ORAL | Status: AC
  Administered 2022-06-26: 20 meq
  Filled 2022-06-26: qty 1

## 2022-06-26 MED ORDER — INSULIN DETEMIR 100 UNIT/ML ~~LOC~~ SOLN
10.0000 [IU] | Freq: Two times a day (BID) | SUBCUTANEOUS | Status: DC
  Filled 2022-06-26: qty 0.1

## 2022-06-26 NOTE — Progress Notes (Signed)
PCCM Progress Note  Critical lab called for glucose 44 on BMP. POC glucose was 65. Given amp of D50 x1. Repeat CBG improved to 116.

## 2022-06-26 NOTE — Progress Notes (Signed)
eLink Physician-Brief Progress Note Patient Name: Nicole Montgomery DOB: 1941/02/21 MRN: [id number]   Date of Service  06/26/2022  HPI/Events of Note  Nursing reports that QTc interval is now 0.511 seconds and that the Seroquel dose with doubled  yesterday. However, the patient is on several other drugs that can increase QTc interval including and Dexmetetomidine (low risk).  eICU Interventions  Plan: Wean Dexmetetomadine as tolerated. Titrate Fentanyl up to maintain sedation.  Continue to trend QTc interval. PCCM rounding team to address Seroquel dosing tomorrow.     Intervention Category Major Interventions: Other:  Tawanna Funk [name] 06/26/2022, 3:28 AM

## 2022-06-26 NOTE — TOC Progression Note (Signed)
Transition of Care [facility]) - Progression Note    Patient Details  Name: Nicole Montgomery MRN: [id number] Date of Birth: 06-Dec-1940  Transition of Care [facility]) CM/SW Contact  Tom-Johnson, Renea Ee, RN Phone Number: 06/26/2022, 2:55 PM  Clinical Narrative:     Patient extubated today, on 5L O2. In no form of distress. CM will continue to follow as patient progresses towards discharge.        Expected Discharge Plan and Services                                               Social Determinants of Health (SDOH) Interventions SDOH Screenings   Tobacco Use: Low Risk  (06/22/2022)    Readmission Risk Interventions     No data to display

## 2022-06-26 NOTE — Procedures (Signed)
Extubation Procedure Note  Patient Details:   Name: Nicole Montgomery DOB: 1941/01/14 MRN: [id number]   Airway Documentation:  Airway 7.5 mm (Active)  Secured at (cm) 23 cm 06/26/22 0737  Measured From Lips 06/26/22 [place] 06/26/22 0737  Secured By [employer] 06/26/22 0737  Tube Holder Repositioned Yes 06/26/22 0737  Prone position No 06/26/22 0737  Cuff Pressure (cm H2O) Clear OR 27-39 [facility] 06/26/22 0737  Site Condition Dry 06/26/22 0737   Vent end date: (not recorded) Vent end time: (not recorded)   Evaluation  O2 sats: stable throughout Complications: No apparent complications Patient did tolerate procedure well. Bilateral Breath Sounds: Clear, Diminished   No  Rhegan Trunnell 06/26/2022, 12:22 PM

## 2022-06-26 NOTE — Progress Notes (Signed)
NAME:  Nicole Montgomery, MRN:  [id number], DOB:  12-31-1940, LOS: 9 ADMISSION DATE:  06/17/2022, CONSULTATION DATE:  06/26/22 REFERRING MD:  EDP, CHIEF COMPLAINT:  AMS   History of Present Illness:  Nicole Montgomery is a 82 y.o. F with PMH significant for Type 2 DM on Ozempic, previous pancreatic adenocarcinoma, status post pancreatectomy, trigeminal Neuralgia, HL who follows with PCP in [place] who presents with altered mental status.  Her husband is at the bedside and provides history-states that she began showing signs of confusion approximately one week ago.  She has had reduced appetite and barely been eating or drinking.  Her confusion worsened to the point she was not responsive on the toilet and so he called EMS who noted glucose >600.   He notes they cared for grandchildren last week who had the flu.  She also has been coughing and short of breath without fever, though had a fall from standing on 12/31.  She did not lose consciousness at that time and did not want to come to the hospital.   In the ED, she was hypertensive, tachycardic, hypoxic requiring [place] and agitated.  CTH was completed without acute findings, labs significant for glucose >1200, bicarb 12, gap 20, beta hydroxy 1.6, WBC 11.3, pH 7.9 and flu A+ with R-sided opacities on CXR.  Her husband states she may have missed her last ozempic injection secondary to her confusion.  PCCM consulted for admission given her AMS  Pertinent  Medical History   has a past medical history of DM (diabetes mellitus) ([place]), H/O splenectomy (2012), HTN (hypertension), Nephrolithiasis, Pancreatic cancer ([place]) (2012), and Sarcoid (2012).  Significant Hospital Events: Including procedures, antibiotic start and stop dates in addition to other pertinent events   1/2 presented with AMS, flu + and in DKA with significant agitation requiring precedex.  Started on antibiotic.  1/3 intubated for sedation needs and oxygen requirement. 1/4 transition from insulin drip  to subQ  1/6 restarted on insulin overnight for hyperglycemia but anion gap remains closed.  Mild vent dyssynchrony with inability to tolerate SBT 1/7 no acute events overnight, glucose remains elevated will need up titration of insulin management.  Tolerating SBT trial this a.m. diuresed almost 5 L with use of diuretics 1/8 start Zosyn  1/10 more awake today, off propofol  1/11 QTC 511 overnight   Interim History / Subjective:  QTC 511 overnight. Wean FiO2 to 40% this morning.   Objective   Blood pressure (!) 167/133, pulse 77, temperature 98.4 F (36.9 C), temperature source Axillary, resp. rate 16, height 5' (1.524 m), weight 58.6 kg, SpO2 92 %.    Vent Mode: PRVC FiO2 (%):  [50 %-60 %] 50 % Set Rate:  [24 bmp] 24 bmp Vt Set:  [360 mL] 360 mL PEEP:  [5 cmH20-6 cmH20] 5 cmH20   Intake/Output Summary (Last 24 hours) at 06/26/2022 0652 Last data filed at 06/26/2022 0200 Gross per 24 hour  Intake 1729.36 ml  Output 1354 ml  Net 375.36 ml    Filed Weights   06/22/22 0339 06/24/22 0500 06/25/22 0500  Weight: 65.7 kg 55.5 kg 58.6 kg   Physical exam General:  ill-appearing female, lying in bed with mechanical ventilation, in no acute distress  HEENT: ETT in place Neuro: asleep this morning CV: RRR PULM: non-labored breathing, coarse breath sounds, mechanically ventilated on 40% FiO2 Skin: warm and dry, erythematous lesions on LE which are chronic per husband  WBC 19.1 slightly improved Hgb 10 relatively unchanged  Na 145 improved K 3.9 improved BUN/Cre 46/1.28, improved   Alb <1.5 Alk Phos 189, improved AST, ALT normal  Resolved Hospital Problem list   DKA Lactic acidosis Hypokalemia  Assessment & Plan:  Type 2 DM Pancreatic adenocarcinoma s/p pancreatectomy (2012) A1c 10.1%. PTA listed as taking Ozempic. CBG controlled with insulin regimen.  P: -decreased levemir yesterday to 10 units, CBG in 200s but fasting is 96, will adjust insulin regimen for better control   -novolog 3 units q4h -SSI resistant scale -CBG goal 140-180  Acute Encephalopathy History of Tregeminal Neuralgia  Seems prolonged for about a week prior to admission-likely multifactorial from DKA, acute infection. MRI brain unremarkable. P: -home amitriptyline and duloxetine, stopped home gabapentin -increased seroquel 50 mg qhs yesterday -wean sedation as able -aspiration and delirium precautions  Acute hypoxic respiratory failure secondary to Influenza A with superimposed bacterial pneumonia and volume overload Chronic hypercarbic respiratory failure Last CXR showed decreasing bilateral airspace disease.  1/7 Trach culture: G - rods and G + cocci > few staph epidermidis, pending 1/3 Blood culture: NG P: -Wean PEEP and FiO2 for sats greater than 90%. -Head of bed elevated 30 degrees. -SBT daily  -VAP bundle in place -PAD protocol with fentanyl and precedex  -continue Zosyn   Prolonged QTc EKG showed QTc of 511, seroquel was increased yesterday. Seroquel has helped with agitation.  -monitor QTc -avoid QTc prolonging medications as much as possible  AKI In the setting of above. Creatinine and GFR improving.   P: -monitor urine output -trend renal function -consider diuresis if needed -avoid nephrotoxic meds  Hypernatremia P: -Na improved  -FWF 200 q4h x 24 hours   Normocytic Anemia Hgb steady around 10.  P: -CBC -monitor for signs of bleeding  Moderate protein energy malnutrition P: -tube feeds  Best Practice (right click and "Reselect all SmartList Selections" daily)   Diet/type: tubefeeds and NPO w/ meds via tube DVT prophylaxis: prophylactic heparin  GI prophylaxis: H2B Lines: N/A Foley:  removal ordered yesterday Code Status:  full code Last date of multidisciplinary goals of care discussion [1/9 updated husband at bedside, will continue to update husband]    Angelique Blonder, DO

## 2022-06-27 ENCOUNTER — Inpatient Hospital Stay (HOSPITAL_COMMUNITY): Payer: PPO

## 2022-06-27 ENCOUNTER — Encounter (HOSPITAL_COMMUNITY): Payer: Self-pay | Admitting: Pulmonary Disease

## 2022-06-27 DIAGNOSIS — G9341 Metabolic encephalopathy: Secondary | ICD-10-CM | POA: Diagnosis not present

## 2022-06-27 DIAGNOSIS — J9601 Acute respiratory failure with hypoxia: Secondary | ICD-10-CM | POA: Diagnosis not present

## 2022-06-27 LAB — MAGNESIUM: Magnesium: 1.9 mg/dL (ref 1.7–2.4)

## 2022-06-27 LAB — CBC
HCT: 30 % — ABNORMAL LOW (ref 36.0–46.0)
Hemoglobin: 9.8 g/dL — ABNORMAL LOW (ref 12.0–15.0)
MCH: 30.4 pg (ref 26.0–34.0)
MCHC: 32.7 g/dL (ref 30.0–36.0)
MCV: 93.2 fL (ref 80.0–100.0)
Platelets: 590 10*3/uL — ABNORMAL HIGH (ref 150–400)
RBC: 3.22 MIL/uL — ABNORMAL LOW (ref 3.87–5.11)
RDW: 15.6 % — ABNORMAL HIGH (ref 11.5–15.5)
WBC: 19.4 10*3/uL — ABNORMAL HIGH (ref 4.0–10.5)
nRBC: 0.4 % — ABNORMAL HIGH (ref 0.0–0.2)

## 2022-06-27 LAB — GLUCOSE, CAPILLARY
Glucose-Capillary: 111 mg/dL — ABNORMAL HIGH (ref 70–99)
Glucose-Capillary: 137 mg/dL — ABNORMAL HIGH (ref 70–99)
Glucose-Capillary: 137 mg/dL — ABNORMAL HIGH (ref 70–99)
Glucose-Capillary: 156 mg/dL — ABNORMAL HIGH (ref 70–99)
Glucose-Capillary: 228 mg/dL — ABNORMAL HIGH (ref 70–99)
Glucose-Capillary: 230 mg/dL — ABNORMAL HIGH (ref 70–99)
Glucose-Capillary: 283 mg/dL — ABNORMAL HIGH (ref 70–99)

## 2022-06-27 LAB — BASIC METABOLIC PANEL
Anion gap: 10 (ref 5–15)
BUN: 35 mg/dL — ABNORMAL HIGH (ref 8–23)
CO2: 29 mmol/L (ref 22–32)
Calcium: 8.2 mg/dL — ABNORMAL LOW (ref 8.9–10.3)
Chloride: 106 mmol/L (ref 98–111)
Creatinine, Ser: 1.24 mg/dL — ABNORMAL HIGH (ref 0.44–1.00)
GFR, Estimated: 44 mL/min — ABNORMAL LOW (ref >60)
Glucose, Bld: 173 mg/dL — ABNORMAL HIGH (ref 70–99)
Potassium: 3.8 mmol/L (ref 3.5–5.1)
Sodium: 145 mmol/L (ref 135–145)

## 2022-06-27 MED ORDER — LABETALOL HCL 5 MG/ML IV SOLN: 5.0000 mg | INTRAVENOUS | Status: DC | PRN

## 2022-06-27 MED ORDER — INSULIN DETEMIR 100 UNIT/ML ~~LOC~~ SOLN
5.0000 [IU] | Freq: Two times a day (BID) | SUBCUTANEOUS | Status: DC
  Administered 2022-06-27 (×2): 5 [IU] via SUBCUTANEOUS
  Filled 2022-06-27 (×4): qty 0.05

## 2022-06-27 MED ORDER — GABAPENTIN 250 MG/5ML PO SOLN
300.0000 mg | Freq: Two times a day (BID) | ORAL | Status: DC
  Administered 2022-06-27 – 2022-06-30 (×7): 300 mg
  Filled 2022-06-27 (×10): qty 6

## 2022-06-27 MED ORDER — GABAPENTIN 600 MG PO TABS
300.0000 mg | ORAL_TABLET | Freq: Two times a day (BID) | ORAL | Status: DC
  Filled 2022-06-27: qty 0.5

## 2022-06-27 MED ORDER — DEXTROSE 10 % IV SOLN: INTRAVENOUS | Status: DC

## 2022-06-27 MED ORDER — PROSOURCE TF20 ENFIT COMPATIBL EN LIQD
60.0000 mL | Freq: Every day | ENTERAL | Status: DC
  Administered 2022-06-27 – 2022-06-30 (×4): 60 mL
  Filled 2022-06-27 (×5): qty 60

## 2022-06-27 MED ORDER — OSMOLITE 1.5 CAL PO LIQD
1000.0000 mL | ORAL | Status: DC
  Administered 2022-06-27 – 2022-06-28 (×2): 1000 mL
  Filled 2022-06-27 (×3): qty 1000

## 2022-06-27 MED ORDER — FUROSEMIDE 10 MG/ML IJ SOLN
40.0000 mg | Freq: Once | INTRAMUSCULAR | Status: AC
  Administered 2022-06-27: 40 mg via INTRAVENOUS
  Filled 2022-06-27: qty 4

## 2022-06-27 MED ORDER — ORAL CARE MOUTH RINSE: 15.0000 mL | OROMUCOSAL | Status: DC | PRN

## 2022-06-27 MED ORDER — DEXTROSE 50 % IV SOLN
12.5000 g | INTRAVENOUS | Status: AC
  Administered 2022-06-27: 12.5 g via INTRAVENOUS
  Filled 2022-06-27: qty 50

## 2022-06-27 NOTE — Progress Notes (Signed)
eLink Physician-Brief Progress Note Patient Name: Nicole Montgomery DOB: 12/30/1940 MRN: [id number]   Date of Service  06/27/2022  HPI/Events of Note  Hypoglycemia - Blood glucose = 55. Already given D50.   eICU Interventions  Plan: D10W IV infusion at 40 mL/hour. Decrease Levemir dose from 10 units to 5 units [place] Q 12 hours.     Intervention Category Major Interventions: Other:  Lysle Dingwall 06/27/2022, 12:30 AM

## 2022-06-27 NOTE — Procedures (Signed)
Cortrak  Tube Type:  Cortrak - 43 inches Tube Location:  Left nare Secured by: Bridle Technique Used to Measure Tube Placement:  Marking at nare/corner of mouth Cortrak Secured At:  70 cm   Cortrak Tube Team Note:  Consult received to place a Cortrak feeding tube.   X-ray is required, abdominal x-ray has been ordered by the Cortrak team. Please confirm tube placement before using the Cortrak tube.   If the tube becomes dislodged please keep the tube and contact the Cortrak team at [email or web address] for replacement.  If after hours and replacement cannot be delayed, place a NG tube and confirm placement with an abdominal x-ray.    Cormac Wint MS, RD, LDN Please refer to AMION for RD and/or RD on-call/weekend/after hours pager   

## 2022-06-27 NOTE — Progress Notes (Signed)
NAME:  Nicole Montgomery, MRN:  [id number], DOB:  09-10-1940, LOS: 68 ADMISSION DATE:  06/17/2022, CONSULTATION DATE:  06/27/22 REFERRING MD:  EDP, CHIEF COMPLAINT:  AMS   History of Present Illness:  Nicole Montgomery is a 82 y.o. F with PMH significant for Type 2 DM on Ozempic, previous pancreatic adenocarcinoma, status post pancreatectomy, trigeminal Neuralgia, HL who follows with PCP in [place] who presents with altered mental status.  Her husband is at the bedside and provides history-states that she began showing signs of confusion approximately one week ago.  She has had reduced appetite and barely been eating or drinking.  Her confusion worsened to the point she was not responsive on the toilet and so he called EMS who noted glucose >600.   He notes they cared for grandchildren last week who had the flu.  She also has been coughing and short of breath without fever, though had a fall from standing on 12/31.  She did not lose consciousness at that time and did not want to come to the hospital.   In the ED, she was hypertensive, tachycardic, hypoxic requiring [place] and agitated.  CTH was completed without acute findings, labs significant for glucose >1200, bicarb 12, gap 20, beta hydroxy 1.6, WBC 11.3, pH 7.9 and flu A+ with R-sided opacities on CXR.  Her husband states she may have missed her last ozempic injection secondary to her confusion.  PCCM consulted for admission given her AMS  Pertinent  Medical History   has a past medical history of DM (diabetes mellitus) ([place]), H/O splenectomy (2012), HTN (hypertension), Nephrolithiasis, Pancreatic cancer ([place]) (2012), and Sarcoid (2012).  Significant Hospital Events: Including procedures, antibiotic start and stop dates in addition to other pertinent events   1/2 presented with AMS, flu + and in DKA with significant agitation requiring precedex.  Started on antibiotic.  1/3 intubated for sedation needs and oxygen requirement. 1/4 transition from insulin  drip to subQ  1/6 restarted on insulin overnight for hyperglycemia but anion gap remains closed.  Mild vent dyssynchrony with inability to tolerate SBT 1/7 no acute events overnight, glucose remains elevated will need up titration of insulin management.  Tolerating SBT trial this a.m. diuresed almost 5 L with use of diuretics 1/8 start Zosyn  1/10 more awake today, off propofol  1/11 QTC 511 overnight, was extubated today 1/12 awake, able to follow commands and move extremities   Interim History / Subjective:  Overnight, hypoglycemia, started on D10 infusion. On BiPAP overnight, now on HFNC this morning. Awake, answers questions, able to follow commands and move extremities   Objective   Blood pressure (!) 142/78, pulse 77, temperature 98.5 F (36.9 C), temperature source Oral, resp. rate 14, height 5' (1.524 m), weight 58.5 kg, SpO2 100 %.    Vent Mode: Stand-by FiO2 (%):  [40 %-60 %] 60 % PEEP:  [5 cmH20] 5 cmH20 Pressure Support:  [8 cmH20] 8 cmH20   Intake/Output Summary (Last 24 hours) at 06/27/2022 0729 Last data filed at 06/27/2022 6606 Gross per 24 hour  Intake 800.98 ml  Output 2060 ml  Net -1259.02 ml    Filed Weights   06/24/22 0500 06/25/22 0500 06/27/22 0500  Weight: 55.5 kg 58.6 kg 58.5 kg   Physical exam General:  ill-appearing female, lying in bed comfortably, in no acute distress HEENT: [place] in place Neuro: awake, answers questions, moves all extremities CV: RRR PULM: upper airway sounds, non-labored breathing on HFNC 10 L Skin: warm and dry, erythematous  lesions on LE which are chronic per husband  WBC 19.4, unchanged Hgb 9.8, slightly decreased  Na 145 improved K 3.8 stable BUN/Cre 35/1.24, relatively unchanged  Resolved Hospital Problem list   DKA Lactic acidosis Hypokalemia  Assessment & Plan:  Type 2 DM Pancreatic adenocarcinoma s/p pancreatectomy (2012) A1c 10.1%. PTA listed as taking Ozempic. Now off tube feeds, will adjust insulin regimen.   P: -levemir decreased to 5 units BID -SSI, adjust sensitivity  -CBG goal 140-180  Acute Encephalopathy History of Tregeminal Neuralgia  1/4 MRI brain unremarkable. P: -home amitriptyline and duloxetine, stopped home gabapentin -decreased seroquel back to 25 mg qhs  -aspiration and delirium precautions -on precedex, plan to wean   Acute hypoxic respiratory failure secondary to Influenza A with superimposed bacterial pneumonia and volume overload Chronic hypercarbic respiratory failure Last CXR showed decreasing bilateral airspace disease.  1/7 Trach culture: G - rods and G + cocci > few staph epidermidis 1/3 Blood culture: NG P: -on precedex, plan to wean  -continue Zosyn  Prolonged QTc Repeat EKG showed QT 494. Reduced seroquel dose.  -monitor QTc -avoid QTc prolonging medications as much as possible  AKI In the setting of above. Creatinine and GFR improving.  P: -monitor urine output -trend renal function -consider diuresis if needed -avoid nephrotoxic meds  Hypernatremia P: -Na improved -FWF 200 q4h x 24 hours   Normocytic Anemia Hgb steady ~9.8.  P: -monitor for any bleeding  Moderate protein energy malnutrition P: -failed bedside swallow  -SLP consult  -RD following -currently NPO  Best Practice (right click and "Reselect all SmartList Selections" daily)   Diet/type: NPO, pending SLP consult DVT prophylaxis: prophylactic heparin  GI prophylaxis: H2B Lines: N/A Foley:  Yes Code Status:  full code Last date of multidisciplinary goals of care discussion [1/11 updated husband at beside ]    Angelique Blonder, DO

## 2022-06-27 NOTE — Progress Notes (Signed)
Patient BP elevated 172/85. SBP mainly in the mid 140s. Occasionally SBPs in the 160s. She is resting in bed with no apparent distress or discomfort.  Dr. Markus Jarvis and Dr. Loanne Drilling notified via secure chat.

## 2022-06-27 NOTE — TOC Progression Note (Signed)
Transition of Care [facility]) - Initial/Assessment Note    Patient Details  Name: Nicole Montgomery MRN: [id number] Date of Birth: 1940/09/05  Transition of Care [facility]) CM/SW Contact:    Milinda Antis, [place] Phone Number: 06/27/2022, 4:11 PM  Clinical Narrative:                 Physical Therapy recommending SNF.  SNF workup to be completed once patient is closer to being medically ready.  TOC following.     Patient Goals and CMS Choice            Expected Discharge Plan and Services                                              Prior Living Arrangements/Services                       Activities of Daily Living      Permission Sought/Granted                  Emotional Assessment              Admission diagnosis:  DKA (diabetic ketoacidosis) ([place]) [E11.10] Influenza A [J10.1] Acute respiratory failure with hypoxia ([place]) [J96.01] Diabetic ketoacidosis without coma associated with type 2 diabetes mellitus ([place]) [E11.10] Pneumonia of right lung due to infectious organism, unspecified part of lung [J18.9] Patient Active Problem List   Diagnosis Date Noted   Pneumonia of right lung due to infectious organism 06/22/2022   Malnutrition of moderate degree 06/19/2022   On mechanically assisted ventilation ([place]) 06/18/2022   Pressure injury of skin 06/18/2022   DKA (diabetic ketoacidosis) ([place]) 06/17/2022   Influenza A 06/17/2022   Acute respiratory failure with hypoxia ([place]) 06/17/2022   Pain in left foot 01/28/2017   Plantar fasciitis, left 01/28/2017   Essential hypertension 04/12/2015   Gastroesophageal reflux disease without esophagitis 04/12/2015   Hyperlipidemia, unspecified 04/12/2015   Postherpetic trigeminal neuralgia 01/13/2013   PCP:  Marco Collie, MD Pharmacy:   [place] [place] [address] Phone: [phone] Fax: [phone]     Social Determinants of Health  (SDOH) Social History: SDOH Screenings   Tobacco Use: Low Risk  (06/27/2022)   SDOH Interventions:     Readmission Risk Interventions     No data to display

## 2022-06-27 NOTE — Evaluation (Signed)
Clinical/Bedside Swallow Evaluation Patient Details  Name: Nicole Montgomery MRN: [id number] Date of Birth: [birth year]  Today's Date: 06/27/2022 Time: SLP Start Time (ACUTE ONLY): 0900 SLP Stop Time (ACUTE ONLY): 9628 SLP Time Calculation (min) (ACUTE ONLY): 23 min  Past Medical History:  Past Medical History:  Diagnosis Date   DM (diabetes mellitus) ([place])    s/p pancreatectomy   H/O splenectomy 2012   HTN (hypertension)    Nephrolithiasis    Pancreatic cancer ([place]) 2012   Sarcoid 2012   Past Surgical History:  Past Surgical History:  Procedure Laterality Date   ABDOMINAL HYSTERECTOMY     APPENDECTOMY     CHOLECYSTECTOMY     PANCREATECTOMY  2012   PARTIAL COLECTOMY  2012   SPLENECTOMY  2012   HPI:  Pt is a 82 y.o. female who presents with altered mental status, flu + and in DKA. Her husband is at the bedside and provides history-states that she began showing signs of confusion approximately one week ago.  She has had reduced appetite and barely been eating or drinking. ETT 1/3- 1/11 for sedation and O2 needs. CXR (06/22/22) revealed "Decreasing bilateral airspace disease". PMH: Type 2 DM on Ozempic, previous pancreatic adenocarcinoma, status post pancreatectomy, trigeminal Neuralgia, HL who follows with PCP in .    Assessment / Plan / Recommendation  Clinical Impression  Pt with PT, transferring to chair upon SLP arrival. Pt with eyes closed, lethargic and minimally responsive to clinician questions and commands. RN reports pt with adequate alertness earlier in the am during completion of [place], though with immediate coughing on thin liquids. Oral mechanism examination limited due to lethargy/mentation, vocal quality hoarse, After completion of oral care, pt actively consumed ice chips, sips of thin/thickened liquids by cup and puree via spoon. She demonstrated poor awareness to PO administration, reduced labial seal around cup/spoon, multiple sub-swallows across consistencies and  delayed weak coughing with liquids (more immediate with thin). At end of session, pt with improved alertness, however many of the above s/sx persisted. Per RN, pt with rapidly fluctuating alertness even today. Clinical presentation is concerning for oropharyngeal dysphagia post prolonged intubation, exacerbated by poor mentation. Recommend remain NPO at this time, with ice chips after oral care when alert. Will f/u for diet advancement at bedside vs intrumental assessment. Discussed with RN, RD and DO who expressed understanding and agreement.   SLP Visit Diagnosis: Dysphagia, unspecified (R13.10)    Aspiration Risk       Diet Recommendation NPO;Ice chips PRN after oral care   Liquid Administration via: Spoon Medication Administration: Via alternative means Supervision: Staff to assist with self feeding Compensations: Minimize environmental distractions;Slow rate;Small sips/bites Postural Changes: Seated upright at 90 degrees    Other  Recommendations Oral Care Recommendations: Oral care QID;Oral care prior to ice chip/H20    Recommendations for follow up therapy are one component of a multi-disciplinary discharge planning process, led by the attending physician.  Recommendations may be updated based on patient status, additional functional criteria and insurance authorization.  Follow up Recommendations  (TBD)      Assistance Recommended at Discharge    Functional Status Assessment Patient has had a recent decline in their functional status and demonstrates the ability to make significant improvements in function in a reasonable and predictable amount of time.  Frequency and Duration min 2x/week  2 weeks       Prognosis Prognosis for Safe Diet Advancement: Good Barriers to Reach Goals:  (mentation)  Swallow Study   General Date of Onset: 06/26/22 HPI: Pt is a 82 y.o. female who presents with altered mental status, flu + and in DKA. Her husband is at the bedside and provides  history-states that she began showing signs of confusion approximately one week ago.  She has had reduced appetite and barely been eating or drinking. ETT 1/3- 1/11 for sedation and O2 needs. CXR (06/22/22) revealed "Decreasing bilateral airspace disease". PMH: Type 2 DM on Ozempic, previous pancreatic adenocarcinoma, status post pancreatectomy, trigeminal Neuralgia, HL who follows with PCP in [place]. Type of Study: Bedside Swallow Evaluation Previous Swallow Assessment: none per EMR Diet Prior to this Study: NPO Temperature Spikes Noted: Yes (99.9) Respiratory Status: Nasal cannula History of Recent Intubation: Yes Length of Intubations (days): 9 days Date extubated: 06/26/22 Behavior/Cognition: Lethargic/Drowsy;Confused;Requires cueing;Distractible Oral Cavity Assessment: Dry;Dried secretions Oral Care Completed by SLP: Yes Oral Cavity - Dentition: Adequate natural dentition;Missing dentition Vision: Functional for self-feeding Self-Feeding Abilities: Needs assist Patient Positioning: Upright in bed;Postural control adequate for testing Baseline Vocal Quality: Hoarse Volitional Cough: Weak    Oral/Motor/Sensory Function Overall Oral Motor/Sensory Function:  (difficult to assess, no obvious asymmetry noted)   Ice Chips Ice chips: Within functional limits Presentation: Spoon   Thin Liquid Thin Liquid: Impaired Presentation: Cup Oral Phase Impairments: Reduced labial seal;Poor awareness of bolus Pharyngeal  Phase Impairments: Cough - Delayed    Nectar Thick Nectar Thick Liquid: Impaired Presentation: Cup;Self Fed Pharyngeal Phase Impairments: Cough - Delayed   Honey Thick Honey Thick Liquid: Impaired Presentation: Cup Pharyngeal Phase Impairments: Cough - Delayed   Puree Puree: Impaired Presentation: Spoon Oral Phase Impairments: Reduced labial seal;Poor awareness of bolus Pharyngeal Phase Impairments: Multiple swallows   Solid     Solid: Not tested        Ellwood Dense,  [place], [place] Office Number: [phone]  Acie Fredrickson 06/27/2022,9:57 AM

## 2022-06-27 NOTE — Progress Notes (Signed)
Nutrition Follow-up  DOCUMENTATION CODES:   Non-severe (moderate) malnutrition in context of chronic illness  INTERVENTION:   Once Cortrak placed and placement confirmed with abdominal x-ray, resume tube feeds: - Osmolite 1.5 @ 50 ml/hr (1200 ml/day) - PROSource TF20 60 ml daily  Tube feeding regimen provides 1880 kcal, 95 grams of protein, and 915 ml of H2O.   - Continue MVI with minerals daily per tube  NUTRITION DIAGNOSIS:   Moderate Malnutrition related to chronic illness (pancreatic adenocarcinoma s/p pancreatectomy) as evidenced by mild fat depletion, moderate muscle depletion.  Ongoing, being addressed via Cortrak placement and tube feeds  GOAL:   Patient will meet greater than or equal to 90% of their needs  Met via tube feeds  MONITOR:   Diet advancement, Labs, Weight trends, TF tolerance, Skin  REASON FOR ASSESSMENT:   Ventilator, Consult Enteral/tube feeding initiation and management  ASSESSMENT:   82 year old female who presented to the ED on 1/02 after being found unresponsive on the toilet. PMH of T2DM, trigeminal neuralgia, previous pancreatic adenocarcinoma s/p pancreatectomy. Pt admitted with DKA, sepsis due to influenza A, AKI.  01/03 - intubated 01/11 - extubated, TF off  Noted pt with hypoglycemic episodes overnight. D10 infusion started. Pt evaluated by SLP today with recommendation to remain NPO at this time. Pt with varied levels of alertness and had coughing with PO trials of thin liquids. Given malnutrition, recommend Cortrak placement to resume tube feeds and for medication access. Discussed with CCM MD who is in agreement. Cortrak order placed.  Pt with mild pitting edema to RUE.  Admit weight: 54.4 kg Current weight: 58.5 kg  Medications reviewed and include: colace, pepcid, SSI q 4 hours, levemir 5 units BID, MVI with minerals daily, miralax, IV abx, precedex drip IVF: D10 @ 40 ml/hr  Labs reviewed: BUN 35, creatinine 1.24, WBC 19.4,  hemoglobin 9.8 CBG's: 55-234 x 24 hours  UOP: 2000 ml x 24 hours Rectal tube: 60 ml x 24 hours I/O's: +3.7 L since admit  Diet Order:   Diet Order             Diet NPO time specified  Diet effective now                   EDUCATION NEEDS:   Not appropriate for education at this time  Skin:  Skin Assessment: Skin Integrity Issues: Stage I: L wrist Stage II: face Stage III: L arm x 2  Last BM:  06/27/22 rectal tube (60 ml x 24 hours)  Height:   Ht Readings from Last 1 Encounters:  06/17/22 5' (1.524 m)    Weight:   Wt Readings from Last 1 Encounters:  06/27/22 58.5 kg    Ideal Body Weight:  45.5 kg  BMI:  Body mass index is 25.19 kg/m.  Estimated Nutritional Needs:   Kcal:  2956-2130  Protein:  80-95 grams  Fluid:  1.7-1.9 L    Gustavus Bryant, MS, RD, LDN Inpatient Clinical Dietitian Please see AMiON for contact information.

## 2022-06-27 NOTE — Evaluation (Signed)
Physical Therapy Evaluation Patient Details Name: Nicole Montgomery MRN: [id number] DOB: 11-Sep-1940 Today's Date: 06/27/2022  History of Present Illness  82 yo female admitted 1/2 with AMS, DKA, flu (+). Intubated 1/3-1/11. PMhx: T2DM, trigeminal neuralgia, HLD, HTN  Clinical Impression  Pt with limited attention, verbalization and command following. Pt with decreased strength, transfers and function currently requiring max-total assist for all mobility. Per chart pt from home with spouse but no family present during session and pt unable to provide PLOF or home setup. Pt on 10L HFNC with SPO2 94%, HR 89 and BP 162/81. Pt will benefit from acute therapy to maximize mobility, safety and function to decrease burden of care.        Recommendations for follow up therapy are one component of a multi-disciplinary discharge planning process, led by the attending physician.  Recommendations may be updated based on patient status, additional functional criteria and insurance authorization.  Follow Up Recommendations Skilled nursing-short term rehab (<3 hours/day) Can patient physically be transported by private vehicle: No    Assistance Recommended at Discharge Frequent or constant Supervision/Assistance  Patient can return home with the following  Two people to help with walking and/or transfers;A lot of help with bathing/dressing/bathroom;Assistance with cooking/housework;Direct supervision/assist for medications management;Assist for transportation;Assistance with feeding;Direct supervision/assist for financial management    Equipment Recommendations Hospital bed;Wheelchair (measurements PT);Rolling walker (2 wheels)  Recommendations for Other Services       Functional Status Assessment Patient has had a recent decline in their functional status and demonstrates the ability to make significant improvements in function in a reasonable and predictable amount of time.     Precautions / Restrictions  Precautions Precautions: Fall;Other (comment) Precaution Comments: HFNC, flexiseal Restrictions Weight Bearing Restrictions: No      Mobility  Bed Mobility Overal bed mobility: Needs Assistance Bed Mobility: Supine to Sit     Supine to sit: Max assist     General bed mobility comments: max assist to transition to sitting with max multimodal cues. assist to initiate all movement and elevate trunk from surface. In sitting pt able to maintain sitting with guarding    Transfers Overall transfer level: Needs assistance   Transfers: Sit to/from Stand, Bed to chair/wheelchair/BSC Sit to Stand: Max assist Stand pivot transfers: Max assist, +2 physical assistance         General transfer comment: max assist with belt to stand and max +2 to step as pt with limited ability to step and sitting prior to reaching chair. Total +2 to scoot back in chair    Ambulation/Gait               General Gait Details: unable  Stairs            Wheelchair Mobility    Modified Rankin (Stroke Patients Only)       Balance Overall balance assessment: Needs assistance   Sitting balance-Leahy Scale: Fair Sitting balance - Comments: static sitting with guarding without support   Standing balance support: Bilateral upper extremity supported Standing balance-Leahy Scale: Zero Standing balance comment: bil UE supported on therapist, assist of belt and left knee blocked to stand                             Pertinent Vitals/Pain Pain Assessment Pain Assessment: CPOT Facial Expression: Relaxed, neutral Body Movements: Absence of movements Muscle Tension: Relaxed Compliance with ventilator (intubated pts.): N/A Vocalization (extubated pts.): Talking in normal tone  or no sound CPOT Total: 0    Home Living Family/patient expects to be discharged to:: Private residence Living Arrangements: Spouse/significant other                 Additional Comments: pt unable  to provide home setup or PLOF and no family present    Prior Function                       Hand Dominance        Extremity/Trunk Assessment   Upper Extremity Assessment Upper Extremity Assessment: Generalized weakness;Difficult to assess due to impaired cognition    Lower Extremity Assessment Lower Extremity Assessment: Generalized weakness;Difficult to assess due to impaired cognition    Cervical / Trunk Assessment Cervical / Trunk Assessment: Kyphotic  Communication      Cognition Arousal/Alertness: Awake/alert Behavior During Therapy: Flat affect Overall Cognitive Status: Impaired/Different from baseline Area of Impairment: Attention                   Current Attention Level: Focused           General Comments: pt with limited attention, able to state her name and "nauseous" then randomly stating "pretty in black". Pt did not respond to orientation questions, very limited initiation        General Comments      Exercises     Assessment/Plan    PT Assessment Patient needs continued PT services  PT Problem List Decreased strength;Decreased mobility;Decreased safety awareness;Decreased coordination;Decreased activity tolerance;Decreased cognition;Decreased balance;Decreased knowledge of use of DME       PT Treatment Interventions DME instruction;Therapeutic activities;Cognitive remediation;Gait training;Therapeutic exercise;Patient/family education;Balance training;Functional mobility training    PT Goals (Current goals can be found in the Care Plan section)  Acute Rehab PT Goals PT Goal Formulation: Patient unable to participate in goal setting Time For Goal Achievement: 07/11/22 Potential to Achieve Goals: Fair    Frequency Min 3X/week     Co-evaluation               AM-PAC PT "6 Clicks" Mobility  Outcome Measure Help needed turning from your back to your side while in a flat bed without using bedrails?: Total Help needed  moving from lying on your back to sitting on the side of a flat bed without using bedrails?: Total Help needed moving to and from a bed to a chair (including a wheelchair)?: Total Help needed standing up from a chair using your arms (e.g., wheelchair or bedside chair)?: Total Help needed to walk in hospital room?: Total Help needed climbing 3-5 steps with a railing? : Total 6 Click Score: 6    End of Session Equipment Utilized During Treatment: Gait belt Activity Tolerance: Patient limited by fatigue;Patient limited by lethargy Patient left: in chair;with call bell/phone within reach;with chair alarm set;with nursing/sitter in room Nurse Communication: Mobility status;Need for lift equipment (or +2 assist for stand pivot) PT Visit Diagnosis: Other abnormalities of gait and mobility (R26.89);Muscle weakness (generalized) (M62.81)    Time: 4034-7425 PT Time Calculation (min) (ACUTE ONLY): 20 min   Charges:   PT Evaluation $PT Eval Moderate Complexity: 1 Mod          [place], PT Acute Rehabilitation Services Office: [phone]   Sandy Salaam Hamilton Marinello 06/27/2022, 9:45 AM

## 2022-06-28 DIAGNOSIS — J9601 Acute respiratory failure with hypoxia: Secondary | ICD-10-CM | POA: Diagnosis not present

## 2022-06-28 DIAGNOSIS — G9341 Metabolic encephalopathy: Secondary | ICD-10-CM | POA: Diagnosis not present

## 2022-06-28 LAB — BASIC METABOLIC PANEL
Anion gap: 13 (ref 5–15)
Anion gap: 10 (ref 5–15)
BUN: 41 mg/dL — ABNORMAL HIGH (ref 8–23)
BUN: 41 mg/dL — ABNORMAL HIGH (ref 8–23)
CO2: 28 mmol/L (ref 22–32)
CO2: 27 mmol/L (ref 22–32)
Calcium: 8 mg/dL — ABNORMAL LOW (ref 8.9–10.3)
Calcium: 8.3 mg/dL — ABNORMAL LOW (ref 8.9–10.3)
Chloride: 100 mmol/L (ref 98–111)
Chloride: 103 mmol/L (ref 98–111)
Creatinine, Ser: 1.27 mg/dL — ABNORMAL HIGH (ref 0.44–1.00)
Creatinine, Ser: 1.15 mg/dL — ABNORMAL HIGH (ref 0.44–1.00)
GFR, Estimated: 42 mL/min — ABNORMAL LOW (ref >60)
GFR, Estimated: 48 mL/min — ABNORMAL LOW (ref >60)
Glucose, Bld: 298 mg/dL — ABNORMAL HIGH (ref 70–99)
Glucose, Bld: 308 mg/dL — ABNORMAL HIGH (ref 70–99)
Potassium: 3.4 mmol/L — ABNORMAL LOW (ref 3.5–5.1)
Potassium: 4 mmol/L (ref 3.5–5.1)
Sodium: 141 mmol/L (ref 135–145)
Sodium: 140 mmol/L (ref 135–145)

## 2022-06-28 LAB — CBC
HCT: 33.2 % — ABNORMAL LOW (ref 36.0–46.0)
Hemoglobin: 10.8 g/dL — ABNORMAL LOW (ref 12.0–15.0)
MCH: 30.3 pg (ref 26.0–34.0)
MCHC: 32.5 g/dL (ref 30.0–36.0)
MCV: 93.3 fL (ref 80.0–100.0)
Platelets: 645 10*3/uL — ABNORMAL HIGH (ref 150–400)
RBC: 3.56 MIL/uL — ABNORMAL LOW (ref 3.87–5.11)
RDW: 15.8 % — ABNORMAL HIGH (ref 11.5–15.5)
WBC: 18.7 10*3/uL — ABNORMAL HIGH (ref 4.0–10.5)
nRBC: 0.1 % (ref 0.0–0.2)

## 2022-06-28 LAB — GLUCOSE, CAPILLARY
Glucose-Capillary: 200 mg/dL — ABNORMAL HIGH (ref 70–99)
Glucose-Capillary: 224 mg/dL — ABNORMAL HIGH (ref 70–99)
Glucose-Capillary: 241 mg/dL — ABNORMAL HIGH (ref 70–99)
Glucose-Capillary: 247 mg/dL — ABNORMAL HIGH (ref 70–99)
Glucose-Capillary: 264 mg/dL — ABNORMAL HIGH (ref 70–99)
Glucose-Capillary: 288 mg/dL — ABNORMAL HIGH (ref 70–99)

## 2022-06-28 LAB — MAGNESIUM: Magnesium: 1.9 mg/dL (ref 1.7–2.4)

## 2022-06-28 MED ORDER — POTASSIUM CHLORIDE 20 MEQ PO PACK
40.0000 meq | PACK | Freq: Once | ORAL | Status: AC
  Administered 2022-06-28: 40 meq
  Filled 2022-06-28: qty 2

## 2022-06-28 MED ORDER — FUROSEMIDE 10 MG/ML IJ SOLN
40.0000 mg | Freq: Once | INTRAMUSCULAR | Status: AC
  Administered 2022-06-28: 40 mg via INTRAVENOUS
  Filled 2022-06-28: qty 4

## 2022-06-28 MED ORDER — INSULIN DETEMIR 100 UNIT/ML ~~LOC~~ SOLN
10.0000 [IU] | Freq: Two times a day (BID) | SUBCUTANEOUS | Status: DC
  Administered 2022-06-28 – 2022-06-29 (×3): 10 [IU] via SUBCUTANEOUS
  Filled 2022-06-28 (×4): qty 0.1

## 2022-06-28 NOTE — Progress Notes (Signed)
[facility] ADULT ICU REPLACEMENT PROTOCOL   The patient does apply for the [facility] Adult ICU Electrolyte Replacment Protocol based on the criteria listed below:   1.Exclusion criteria: TCTS, ECMO, Dialysis, and Myasthenia Gravis patients 2. Is GFR >/= 30 ml/min? Yes.    Patient's GFR today is 42 3. Is SCr </= 2? Yes.   Patient's SCr is 1.27 mg/dL 4. Did SCr increase >/= 0.5 in 24 hours? No. 5.Pt's weight >40kg  Yes.   6. Abnormal electrolyte(s): K+ 3.4  7. Electrolytes replaced per protocol 8.  Call MD STAT for K+ </= 2.5, Phos </= 1, or Mag </= 1 Physician:  Bethann Humble.  Pennie Banter 06/28/2022 1:53 AM

## 2022-06-28 NOTE — Evaluation (Signed)
Occupational Therapy Evaluation Patient Details Name: Nicole Montgomery MRN: [id number] DOB: 12/27/1940 Today's Date: 06/28/2022   History of Present Illness 82 yo female admitted 1/2 with AMS, DKA, flu (+). Intubated 1/3-1/11. PMhx: T2DM, trigeminal neuralgia, HLD, HTN   Clinical Impression   PTA patient independent with ADLs, mobility and driving. Admitted for above and presents with problem list below.  Today, pt able to follow simple commands with increased time, attend to task with cueing but demonstrates poor awareness, problem solving- limited by [facility] as well.  Very limited verbalizations. She requires mod assist for bed mobility, max assist +2 safety for transfers and mod to total assist for ADLs. VSS on 4L [place].  Family at side and supportive. Will benefit from further OT services acutely and after dc at SNF level to optimize independence, safety and return to PLOF.      Recommendations for follow up therapy are one component of a multi-disciplinary discharge planning process, led by the attending physician.  Recommendations may be updated based on patient status, additional functional criteria and insurance authorization.   Follow Up Recommendations  Skilled nursing-short term rehab (<3 hours/day)     Assistance Recommended at Discharge Frequent or constant Supervision/Assistance  Patient can return home with the following Two people to help with walking and/or transfers;A lot of help with bathing/dressing/bathroom;Assistance with cooking/housework;Direct supervision/assist for medications management;Direct supervision/assist for financial management;Assist for transportation;Help with stairs or ramp for entrance    Functional Status Assessment  Patient has had a recent decline in their functional status and demonstrates the ability to make significant improvements in function in a reasonable and predictable amount of time.  Equipment Recommendations  Other (comment) (defer)     Recommendations for Other Services       Precautions / Restrictions Precautions Precautions: Fall;Other (comment) Precaution Comments: flexiseal Restrictions Weight Bearing Restrictions: No      Mobility Bed Mobility Overal bed mobility: Needs Assistance Bed Mobility: Supine to Sit     Supine to sit: Mod assist     General bed mobility comments: pt managing LEs but mod assist for trunk and scooting forward    Transfers Overall transfer level: Needs assistance   Transfers: Sit to/from Stand, Bed to chair/wheelchair/BSC Sit to Stand: Max assist, +2 safety/equipment Stand pivot transfers: Max assist, +2 safety/equipment         General transfer comment: assist to power up able to step to recliner with max assist to maintain balance      Balance Overall balance assessment: Needs assistance Sitting-balance support: No upper extremity supported, Feet supported Sitting balance-Leahy Scale: Fair Sitting balance - Comments: static sitting with guarding without support   Standing balance support: Bilateral upper extremity supported, During functional activity Standing balance-Leahy Scale: Poor Standing balance comment: relies on BUE and external support                           ADL either performed or assessed with clinical judgement   ADL Overall ADL's : Needs assistance/impaired     Grooming: Moderate assistance;Sitting           Upper Body Dressing : Sitting;Moderate assistance   Lower Body Dressing: Total assistance;Sit to/from stand   Toilet Transfer: Maximal assistance;+2 for safety/equipment;Stand-pivot Toilet Transfer Details (indicate cue type and reason): simulated to recliner         Functional mobility during ADLs: Maximal assistance;+2 for safety/equipment;Cueing for safety       Vision  Vision Assessment?: No apparent visual deficits     Perception     Praxis      Pertinent Vitals/Pain Pain Assessment Pain  Assessment: Faces Faces Pain Scale: No hurt Pain Intervention(s): Monitored during session     Hand Dominance     Extremity/Trunk Assessment Upper Extremity Assessment Upper Extremity Assessment: Generalized weakness   Lower Extremity Assessment Lower Extremity Assessment: Defer to PT evaluation   Cervical / Trunk Assessment Cervical / Trunk Assessment: Kyphotic   Communication Communication Communication: HOH   Cognition Arousal/Alertness: Awake/alert Behavior During Therapy: Flat affect Overall Cognitive Status: Impaired/Different from baseline Area of Impairment: Orientation, Attention, Memory, Following commands, Safety/judgement, Awareness, Problem solving                 Orientation Level: Disoriented to, Time, Situation Current Attention Level: Focused Memory: Decreased short-term memory, Decreased recall of precautions Following Commands: Follows one step commands consistently, Follows one step commands with increased time, Follows multi-step commands inconsistently Safety/Judgement: Decreased awareness of safety, Decreased awareness of deficits Awareness: Intellectual Problem Solving: Slow processing, Decreased initiation, Difficulty sequencing, Requires verbal cues, Requires tactile cues General Comments: pt able to state her name, follows simple commands with increased time. limited by [place] Comments  family present and supportive, agreeable for rehab. on 4L [place] today    Exercises     Shoulder Instructions      Home Living Family/patient expects to be discharged to:: Private residence Living Arrangements: Spouse/significant other Available Help at Discharge: Family;Available 24 hours/day Type of Home: House Home Access: Stairs to enter [employer] of Steps: 4-5 back, 2 front Entrance Stairs-Rails:  (back both, front none) Home Layout: One level     Bathroom Shower/Tub: Teacher, early years/pre: Standard     Home  Equipment: Public relations account executive (2 wheels);Cane - single point;Rollator (4 wheels)          Prior Functioning/Environment Prior Level of Function : Independent/Modified Independent;Driving;History of Falls (last six months)             Mobility Comments: was ambulating with independence, some assist after catching the flu ADLs Comments: was independent, IADLs, managing meds, driving        OT Problem List: Decreased strength;Decreased activity tolerance;Impaired balance (sitting and/or standing);Decreased cognition;Decreased safety awareness;Decreased knowledge of precautions;Decreased knowledge of use of DME or AE;Cardiopulmonary status limiting activity      OT Treatment/Interventions: Self-care/ADL training;Therapeutic exercise;DME and/or AE instruction;Therapeutic activities;Patient/family education;Balance training;Cognitive remediation/compensation    OT Goals(Current goals can be found in the care plan section) Acute Rehab OT Goals Patient Stated Goal: rehab OT Goal Formulation: With family Time For Goal Achievement: 07/12/22 Potential to Achieve Goals: Good  OT Frequency: Min 2X/week    Co-evaluation              AM-PAC OT "6 Clicks" Daily Activity     Outcome Measure Help from another person eating meals?: Total Help from another person taking care of personal grooming?: A Lot Help from another person toileting, which includes using toliet, bedpan, or urinal?: Total Help from another person bathing (including washing, rinsing, drying)?: A Lot Help from another person to put on and taking off regular upper body clothing?: A Lot Help from another person to put on and taking off regular lower body clothing?: Total 6 Click Score: 9   End of Session Equipment Utilized During Treatment: Oxygen Nurse Communication: Mobility status  Activity Tolerance: Patient tolerated treatment well Patient left: in chair;with  call bell/phone within reach;with chair alarm  set;with family/visitor present  OT Visit Diagnosis: Other abnormalities of gait and mobility (R26.89);Muscle weakness (generalized) (M62.81);Other symptoms and signs involving cognitive function                Time: 5110-2111 OT Time Calculation (min): 27 min Charges:  OT General Charges $OT Visit: 1 Visit OT Evaluation $OT Eval Moderate Complexity: 1 Mod OT Treatments $Self Care/Home Management : 8-22 mins  Jolaine Artist, OT Acute Rehabilitation Services Office [phone]   Delight Stare 06/28/2022, 12:05 PM

## 2022-06-28 NOTE — Progress Notes (Signed)
NAME:  SHAKIRA Montgomery, MRN:  [id number], DOB:  1941-05-26, Montgomery: 35 ADMISSION DATE:  06/17/2022, CONSULTATION DATE:  06/28/22 REFERRING MD:  EDP, CHIEF COMPLAINT:  AMS   History of Present Illness:  Nicole Montgomery is a 82 y.o. F with PMH significant for Type 2 DM on Ozempic, previous pancreatic adenocarcinoma, status post pancreatectomy, trigeminal Neuralgia, HL who follows with PCP in [place] who presents with altered mental status.  Her husband is at the bedside and provides history-states that she began showing signs of confusion approximately one week ago.  She has had reduced appetite and barely been eating or drinking.  Her confusion worsened to the point she was not responsive on the toilet and so he called EMS who noted glucose >600.   He notes they cared for grandchildren last week who had the flu.  She also has been coughing and short of breath without fever, though had a fall from standing on 12/31.  She did not lose consciousness at that time and did not want to come to the hospital.   In the ED, she was hypertensive, tachycardic, hypoxic requiring [place] and agitated.  CTH was completed without acute findings, labs significant for glucose >1200, bicarb 12, gap 20, beta hydroxy 1.6, WBC 11.3, pH 7.9 and flu A+ with R-sided opacities on CXR.  Her husband states she may have missed her last ozempic injection secondary to her confusion.  PCCM consulted for admission given her AMS  Pertinent  Medical History   has a past medical history of DM (diabetes mellitus) ([place]), H/O splenectomy (2012), HTN (hypertension), Nephrolithiasis, Pancreatic cancer ([place]) (2012), and Sarcoid (2012).  Significant Hospital Events: Including procedures, antibiotic start and stop dates in addition to other pertinent events   1/2 presented with AMS, flu + and in DKA with significant agitation requiring precedex.  Started on antibiotic.  1/3 intubated for sedation needs and oxygen requirement. 1/4 transition from insulin  drip to subQ  1/6 restarted on insulin overnight for hyperglycemia but anion gap remains closed.  Mild vent dyssynchrony with inability to tolerate SBT 1/7 no acute events overnight, glucose remains elevated will need up titration of insulin management.  Tolerating SBT trial this a.m. diuresed almost 5 L with use of diuretics 1/8 start Zosyn  1/10 more awake today, off propofol  1/11 QTC 511 overnight, was extubated today 1/12 awake, able to follow commands and move extremities, stopped Precedex, cortrak placed, tube feeds  1/13 on [place] at 4L  Interim History / Subjective:  Stopped Precedex yesterday On 4L [place]  Using IS Resumed tube feeds  Objective   Blood pressure (!) 146/69, pulse 98, temperature 98.4 F (36.9 C), temperature source Oral, resp. rate (!) 28, height 5' (1.524 m), weight 54.8 kg, SpO2 96 %.        Intake/Output Summary (Last 24 hours) at 06/28/2022 0656 Last data filed at 06/28/2022 0600 Gross per 24 hour  Intake 1654.67 ml  Output 3000 ml  Net -1345.33 ml    Filed Weights   06/25/22 0500 06/27/22 0500 06/28/22 0500  Weight: 58.6 kg 58.5 kg 54.8 kg   Physical exam General:  ill-appearing female, lying in bed comfortably, in no acute distress HEENT: [place] and NG in place Neuro: alert and oriented to person and place, able to follow commands CV: RRR PULM: coarse breath sounds, mild wheezing, non-labored breathing on 4L  Skin: warm and dry, erythematous papular lesions on LE which are chronic per husband  WBC 19.4>18.7, improving Hgb 9.8>10.8,  improving  Na 141, improved K 3.4 BUN/Cre 41/1.27, relatively unchanged after diuresis   Resolved Hospital Problem list   DKA Lactic acidosis Hypernatremia  Assessment & Plan:  Type 2 DM Pancreatic adenocarcinoma s/p pancreatectomy (2012) A1c 10.1%. PTA listed as taking Ozempic. Cortrak placed and resumed tube feeds.  P: -increase levemir to 10 units BID -consider add on short-acting insulin given resumed tube  feeds -on SSI -CBG goal 140-180  Acute Encephalopathy History of Trigeminal Neuralgia  1/4 MRI brain unremarkable. P: -home amitriptyline and duloxetine -restarted gabapentin 300 mg BID -seroquel 25 mg qhs -aspiration and delirium precautions -off precedex yesterday  Acute hypoxic respiratory failure secondary to Influenza A with superimposed bacterial pneumonia and volume overload Chronic hypercarbic respiratory failure Last CXR showed decreasing bilateral airspace disease.  1/7 Trach culture: G - rods and G + cocci > few staph epidermidis 1/3 Blood culture: negative P: -completed Zosyn 5 days  Prolonged QTc Repeat EKG showed QT 494. Reduced seroquel dose.  -monitor QTc -avoid QTc prolonging medications as much as possible  AKI In the setting of above. Creatinine and GFR steady after diuresis.  P: -continue diuresis, remains net positive -monitor urine output -trend renal function -avoid nephrotoxic meds  Hypokalemia P: -replete electrolytes  Normocytic Anemia Hgb stable 10.8 P: -monitor for signs of bleeding  Moderate protein energy malnutrition Failed SLP swallow eval. Cortrak in place. Tube feeds. P: -NPO with ice chips -Tube feeds -appreciate SLP and RD assistance  Best Practice (right click and "Reselect all SmartList Selections" daily)   Diet/type: NPO DVT prophylaxis: prophylactic heparin  GI prophylaxis: N/A Lines: N/A Foley:  Yes Code Status:  full code Last date of multidisciplinary goals of care discussion [1/12 updated husband at bedside ]   Angelique Blonder, DO

## 2022-06-29 DIAGNOSIS — E1011 Type 1 diabetes mellitus with ketoacidosis with coma: Secondary | ICD-10-CM | POA: Diagnosis not present

## 2022-06-29 LAB — CBC
HCT: 32.5 % — ABNORMAL LOW (ref 36.0–46.0)
Hemoglobin: 10.4 g/dL — ABNORMAL LOW (ref 12.0–15.0)
MCH: 30.4 pg (ref 26.0–34.0)
MCHC: 32 g/dL (ref 30.0–36.0)
MCV: 95 fL (ref 80.0–100.0)
Platelets: 730 10*3/uL — ABNORMAL HIGH (ref 150–400)
RBC: 3.42 MIL/uL — ABNORMAL LOW (ref 3.87–5.11)
RDW: 15.7 % — ABNORMAL HIGH (ref 11.5–15.5)
WBC: 15.8 10*3/uL — ABNORMAL HIGH (ref 4.0–10.5)
nRBC: 0 % (ref 0.0–0.2)

## 2022-06-29 LAB — BASIC METABOLIC PANEL
Anion gap: 9 (ref 5–15)
BUN: 39 mg/dL — ABNORMAL HIGH (ref 8–23)
CO2: 28 mmol/L (ref 22–32)
Calcium: 8.3 mg/dL — ABNORMAL LOW (ref 8.9–10.3)
Chloride: 107 mmol/L (ref 98–111)
Creatinine, Ser: 1.04 mg/dL — ABNORMAL HIGH (ref 0.44–1.00)
GFR, Estimated: 54 mL/min — ABNORMAL LOW (ref >60)
Glucose, Bld: 260 mg/dL — ABNORMAL HIGH (ref 70–99)
Potassium: 3.8 mmol/L (ref 3.5–5.1)
Sodium: 144 mmol/L (ref 135–145)

## 2022-06-29 LAB — GLUCOSE, CAPILLARY
Glucose-Capillary: 258 mg/dL — ABNORMAL HIGH (ref 70–99)
Glucose-Capillary: 379 mg/dL — ABNORMAL HIGH (ref 70–99)
Glucose-Capillary: 177 mg/dL — ABNORMAL HIGH (ref 70–99)
Glucose-Capillary: 112 mg/dL — ABNORMAL HIGH (ref 70–99)
Glucose-Capillary: 318 mg/dL — ABNORMAL HIGH (ref 70–99)

## 2022-06-29 MED ORDER — INSULIN DETEMIR 100 UNIT/ML ~~LOC~~ SOLN
13.0000 [IU] | Freq: Two times a day (BID) | SUBCUTANEOUS | Status: DC
  Administered 2022-06-29 – 2022-06-30 (×3): 13 [IU] via SUBCUTANEOUS
  Filled 2022-06-29 (×5): qty 0.13

## 2022-06-29 NOTE — Progress Notes (Signed)
PROGRESS NOTE    Nicole Montgomery  Nicole Montgomery:[id number] DOB: [birth year] DOA: 06/17/2022 PCP: Marco Collie, MD  Chief Complaint  Patient presents with   Altered Mental Status   Hyperglycemia    Brief Narrative:  Nicole Montgomery is Nicole Montgomery 82 y.o. F with PMH significant for Type 2 DM on Ozempic, previous pancreatic adenocarcinoma, status post partial pancreatectomy, trigeminal Neuralgia, HL who follows with PCP in [place] who presents with altered mental status.  Her husband is at the bedside and provides history-states that she began showing signs of confusion approximately one week ago.  She has had reduced appetite and barely been eating or drinking.  Her confusion worsened to the point she was not responsive on the toilet and so he called EMS who noted glucose >600.   He notes they cared for grandchildren last week who had the flu.  She also has been coughing and short of breath without fever, though had Nicole Montgomery fall from standing on 12/31.  She did not lose consciousness at that time and did not want to come to the hospital.    In the ED, she was hypertensive, tachycardic, hypoxic requiring [place] and agitated.  CTH was completed without acute findings, labs significant for glucose >1200, bicarb 12, gap 20, beta hydroxy 1.6, WBC 11.3, pH 7.9 and flu [place]le [place]+ with R-sided opacities on CXR.  Her husband states she may have missed her last ozempic injection secondary to her confusion.  PCCM consulted for admission given her AMS  Significant Events 1/2 presented with AMS, flu + and in DKA with significant agitation requiring precedex.  Started on antibiotic.  1/3 intubated for sedation needs and oxygen requirement. 1/4 transition from insulin drip to subQ  1/6 restarted on insulin overnight for hyperglycemia but anion gap remains closed.  Mild vent dyssynchrony with inability to tolerate SBT 1/7 no acute events overnight, glucose remains elevated will need up titration of insulin management.  Tolerating SBT trial this Nicole Montgomery.m.  diuresed almost 5 L with use of diuretics 1/8 start Zosyn  1/10 more awake today, off propofol  1/11 QTC 511 overnight, was extubated today 1/12 awake, able to follow commands and move extremities, stopped Precedex, cortrak placed, tube feeds  1/13 on [place] at 4L   Assessment & Plan:   Principal Problem:   DKA (diabetic ketoacidosis) ([place]) Active Problems:   Influenza [name]   Acute respiratory failure with hypoxia (HCC)   On mechanically assisted ventilation (HCC)   Pressure injury of skin   Malnutrition of moderate degree   Pneumonia of right lung due to infectious organism  Type 2 Diabetes  DKA  Pancreatic adenocarcinoma s/p partial pancreatectomy (2012) A1c 10.1%. PTA listed as taking Ozempic. Cortrak placed and resumed tube feeds.  Continue levemir, will increase dose Aleksey Newbern bit  Q4 SSI    Acute hypoxic respiratory failure secondary to Influenza Nicole Montgomery with superimposed bacterial pneumonia and volume overload Chronic hypercarbic respiratory failure CXR 1/12 with patchy infiltrates in R and L lung base 1/7 Trach culture: G - rods and G + cocci > few staph epidermidis and candida albicans 1/3 Blood culture: negative completed Zosyn 5 days Continue to wean O2 as tolerated, currently on 4 L    Dysphagia  Moderate protein energy malnutrition Failed SLP swallow eval. Cortrak in place. Tube feeds. Appreciate SLP and RD recs  Acute Encephalopathy Delirium precautions 1/4 MRI brain unremarkable. Per husband, sounds like she's close to her baseline seroquel 25 mg qhs -> will d/c Precedex d/c'd  History of Trigeminal  Neuralgia  home amitriptyline and duloxetine restarted gabapentin 300 mg BID  Prolonged QTc Repeat EKG showed QT 494.  Repeat EKG intermittently  Caution with QT prolonging meds   AKI Creatinine improving   Hypokalemia Replace and follow   Normocytic Anemia trend      DVT prophylaxis: heparin Code Status: full Family Communication: none Disposition:    Status is: Inpatient Remains inpatient appropriate because: continued need for inpatient care, tube feeding   Consultants:  PCCM  Procedures:  Echo IMPRESSIONS     1. Left ventricular ejection fraction, by estimation, is 60 to 65%. The  left ventricle has normal function. Left ventricular endocardial border  not optimally defined to evaluate regional wall motion. Left ventricular  diastolic parameters are consistent  with Grade I diastolic dysfunction (impaired relaxation).   2. Right ventricular systolic function is normal. The right ventricular  size is normal. There is normal pulmonary artery systolic pressure. The  estimated right ventricular systolic pressure is 29.9 mmHg.   3. The mitral valve is abnormal. No evidence of mitral valve  regurgitation. No evidence of mitral stenosis.   4. The aortic valve is tricuspid. Aortic valve regurgitation is not  visualized. No aortic stenosis is present.   5. The inferior vena cava is normal in size with greater than 50%  respiratory variability, suggesting right atrial pressure of 3 mmHg.   Comparison(s): No prior Echocardiogram.    Antimicrobials:  Anti-infectives (From admission, onward)    Start     Dose/Rate Route Frequency Ordered Stop   06/26/22 1400  piperacillin-tazobactam (ZOSYN) IVPB 3.375 g        3.375 g 12.5 mL/hr over 240 Minutes Intravenous Every 8 hours 06/26/22 0847 06/28/22 0210   06/24/22 1400  piperacillin-tazobactam (ZOSYN) IVPB 2.25 g  Status:  Discontinued        2.25 g 100 mL/hr over 30 Minutes Intravenous Every 8 hours 06/24/22 0952 06/26/22 0847   06/23/22 1115  piperacillin-tazobactam (ZOSYN) IVPB 3.375 g  Status:  Discontinued        3.375 g 12.5 mL/hr over 240 Minutes Intravenous Every 8 hours 06/23/22 1026 06/24/22 0952   06/22/22 1000  oseltamivir (TAMIFLU) capsule 30 mg        30 mg Per Tube Daily 06/21/22 1310 06/23/22 0941   06/19/22 2215  cefTRIAXone (ROCEPHIN) 2 g in sodium chloride 0.9 %  100 mL IVPB  Status:  Discontinued        2 g 200 mL/hr over 30 Minutes Intravenous Every 24 hours 06/19/22 1351 06/23/22 1023   06/19/22 1030  oseltamivir (TAMIFLU) capsule 30 mg        30 mg Per Tube Daily 06/19/22 0936 06/21/22 0931   06/18/22 2215  cefTRIAXone (ROCEPHIN) 1 g in sodium chloride 0.9 % 100 mL IVPB  Status:  Discontinued        1 g 200 mL/hr over 30 Minutes Intravenous Every 24 hours 06/17/22 2312 06/19/22 1351   06/18/22 2200  azithromycin (ZITHROMAX) 500 mg in sodium chloride 0.9 % 250 mL IVPB  Status:  Discontinued        500 mg 250 mL/hr over 60 Minutes Intravenous Every 24 hours 06/17/22 2312 06/23/22 1023   06/18/22 1000  oseltamivir (TAMIFLU) capsule 75 mg  Status:  Discontinued        75 mg Oral Daily 06/17/22 2244 06/17/22 2249   06/18/22 0000  oseltamivir (TAMIFLU) capsule 30 mg  Status:  Discontinued        30  mg Oral Daily 06/17/22 2249 06/19/22 0936   06/17/22 2130  cefTRIAXone (ROCEPHIN) 1 g in sodium chloride 0.9 % 100 mL IVPB        1 g 200 mL/hr over 30 Minutes Intravenous  Once 06/17/22 2121 06/17/22 2233   06/17/22 2130  azithromycin (ZITHROMAX) 500 mg in sodium chloride 0.9 % 250 mL IVPB        500 mg 250 mL/hr over 60 Minutes Intravenous  Once 06/17/22 2121 06/17/22 2327       Subjective: Asking for something to drink  Objective: Vitals:   06/29/22 0000 06/29/22 0359 06/29/22 0720 06/29/22 1220  BP: 130/66 129/66 (!) 143/83   Pulse: 98 94 (!) 106   Resp: '14 16 16 18  '$ Temp: 99.1 F (37.3 C) 98.9 F (37.2 C) 98 F (36.7 C) 98.3 F (36.8 C)  TempSrc: Oral Oral Rectal Oral  SpO2: 96% 96% 94%   Weight:      Height:        Intake/Output Summary (Last 24 hours) at 06/29/2022 1509 Last data filed at 06/29/2022 1307 Gross per 24 hour  Intake 1363.62 ml  Output 1450 ml  Net -86.38 ml   Filed Weights   06/27/22 0500 06/28/22 0500 06/28/22 1649  Weight: 58.5 kg 54.8 kg 55.6 kg    Examination:  General: No acute  distress. Cardiovascular: RRR Lungs: unlabored Abdomen: Soft, nontender, nondistended with normal active bowel sounds. No masses. No hepatosplenomegaly. Neurological: Alert and oriented 3. Moves all extremities 4. Cranial nerves II through XII grossly intact. Extremities: No clubbing or cyanosis. No edema.   Data Reviewed: I have personally reviewed following labs and imaging studies  CBC: Recent Labs  Lab 06/25/22 0403 06/26/22 0124 06/27/22 0711 06/28/22 0053 06/29/22 0203  WBC 20.8* 19.1* 19.4* 18.7* 15.8*  NEUTROABS  --  16.6*  --   --   --   HGB 10.8* 10.0* 9.8* 10.8* 10.4*  HCT 32.7* 32.0* 30.0* 33.2* 32.5*  MCV 92.6 96.4 93.2 93.3 95.0  PLT 379 PLATELET CLUMPS NOTED ON SMEAR, COUNT APPEARS INCREASED 590* 645* 730*    Basic Metabolic Panel: Recent Labs  Lab 06/23/22 0141 06/24/22 0619 06/25/22 0403 06/26/22 0124 06/26/22 1618 06/27/22 0711 06/28/22 0053 06/28/22 0730 06/28/22 1658 06/29/22 0203  NA 147* 150* 147*   < > 146* 145 141  --  140 144  K 4.0 3.7 3.3*   < > 3.7 3.8 3.4*  --  4.0 3.8  CL 103 99 98   < > 104 106 100  --  103 107  CO2 32 38* 36*   < > '31 29 28  '$ --  27 28  GLUCOSE 175* 87 105*   < > 44* 173* 298*  --  308* 260*  BUN 54* 64* 56*   < > 42* 35* 41*  --  41* 39*  CREATININE 1.77* 1.92* 1.60*   < > 1.22* 1.24* 1.27*  --  1.15* 1.04*  CALCIUM 8.2* 8.3* 8.2*   < > 8.4* 8.2* 8.0*  --  8.3* 8.3*  MG 1.9 2.1 2.1  --   --  1.9  --  1.9  --   --   PHOS 2.6 3.5  --   --   --   --   --   --   --   --    < > = values in this interval not displayed.    GFR: Estimated Creatinine Clearance: 33.2 mL/min (Nicole Montgomery) (by C-G formula based  on SCr of 1.04 mg/dL (H)).  Liver Function Tests: Recent Labs  Lab 06/23/22 0141 06/24/22 0619 06/26/22 0124  AST 66* 46* 25  ALT 34 29 19  ALKPHOS 266* 256* 189*  BILITOT 0.7 0.8 0.5  PROT 5.8* 6.1* 5.8*  ALBUMIN 1.5* 1.5* <1.5*    CBG: Recent Labs  Lab 06/28/22 2059 06/28/22 2357 06/29/22 0358  06/29/22 0849 06/29/22 1225  GLUCAP 241* 200* 258* 379* 177*     Recent Results (from the past 240 hour(s))  Culture, Respiratory w Gram Stain     Status: None   Collection Time: 06/22/22  9:43 AM   Specimen: Tracheal Aspirate; Respiratory  Result Value Ref Range Status   Specimen Description TRACHEAL ASPIRATE  Final   Special Requests NONE  Final   Gram Stain   Final    MODERATE WBC PRESENT, PREDOMINANTLY PMN ABUNDANT GRAM NEGATIVE RODS FEW GRAM POSITIVE COCCI IN CHAINS    Culture   Final    FEW STAPHYLOCOCCUS EPIDERMIDIS FEW CANDIDA ALBICANS No Pseudomonas species isolated Performed at [facility], [address], [place], [place] [address]    Report Status 06/26/2022 FINAL  Final   Organism ID, Bacteria STAPHYLOCOCCUS EPIDERMIDIS  Final      Susceptibility   Staphylococcus epidermidis - MIC*    CIPROFLOXACIN <=0.5 SENSITIVE Sensitive     ERYTHROMYCIN >=8 RESISTANT Resistant     GENTAMICIN <=0.5 SENSITIVE Sensitive     OXACILLIN >=4 RESISTANT Resistant     TETRACYCLINE >=16 RESISTANT Resistant     VANCOMYCIN 2 SENSITIVE Sensitive     TRIMETH/SULFA <=10 SENSITIVE Sensitive     CLINDAMYCIN <=0.25 SENSITIVE Sensitive     RIFAMPIN <=0.5 SENSITIVE Sensitive     Inducible Clindamycin NEGATIVE Sensitive     * FEW STAPHYLOCOCCUS EPIDERMIDIS         Radiology Studies: No results found.      Scheduled Meds:  amitriptyline  50 mg Per Tube QHS   Chlorhexidine Gluconate Cloth  6 each Topical Daily   DULoxetine  60 mg Oral Daily   feeding supplement (PROSource TF20)  60 mL Per Tube Daily   gabapentin  300 mg Per Tube Q12H   heparin  5,000 Units Subcutaneous Q8H   insulin aspart  0-20 Units Subcutaneous Q4H   insulin detemir  10 Units Subcutaneous BID   multivitamin with minerals  1 tablet Per Tube Daily   QUEtiapine  25 mg Per Tube QHS   Continuous Infusions:  sodium chloride 10 mL/hr at 06/25/22 2142   feeding supplement (OSMOLITE 1.5 CAL) 50 mL/hr at  06/29/22 0304     LOS: 12 days    Time spent: over 79 min    Fayrene Helper, MD [facility]   To contact the attending provider between 7A-7P or the covering provider during after hours 7P-7A, please log into the web site [email or web address] and access using universal  password for that web site. If you do not have the password, please call the hospital operator.  06/29/2022, 3:09 PM

## 2022-06-29 NOTE — Progress Notes (Signed)
Speech Language Pathology Treatment: Dysphagia  Patient Details Name: SHELLIA HARTL MRN: [id number] DOB: 12-30-1940 Today's Date: 06/29/2022 Time: 1250-1310 SLP Time Calculation (min) (ACUTE ONLY): 20 min  Assessment / Plan / Recommendation Clinical Impression  Patient seen by SLP for skilled treatment focused on dysphagia goals. Patient was awake, alert and had just gotten cleaned up by nursing. She denied any pain in throat. When SLP introducing self and role, patient said, "I thought I was getting my eyes checked". She was receptive to oral care with toothette sponge and Yankauer suction. No significant amount of secretions observed in oral cavity but she did appear with healing abrasion on hard palate which was likely from intubation. Voice was dysphonic, weak, hoarse but this improved following PO intake of thin liquids (water). Patient able to hold cup and give self sips. (Straws not attempted) A trace amount of anterior spillage observed but this is believed to be from interference from Cortrak tubing. Patient exhibited inconsistent coughing which was non-productive but did sound congested. Swallow initiation appeared timely and overall, patient appears to be tolerating thin liquids well. SLP recommending to continue NPO status but allow PRN water sips (from cup) and/or ice chips after oral care. Plan to check for readiness to have MBS to r/o aspiration likely in next 1-2 dates.    HPI HPI: Pt is a 82 y.o. female who presents with altered mental status, flu + and in DKA. Her husband is at the bedside and provides history-states that she began showing signs of confusion approximately one week ago.  She has had reduced appetite and barely been eating or drinking. ETT 1/3- 1/11 for sedation and O2 needs. CXR (06/22/22) revealed "Decreasing bilateral airspace disease". PMH: Type 2 DM on Ozempic, previous pancreatic adenocarcinoma, status post pancreatectomy, trigeminal Neuralgia, HL who follows with PCP  in [place].      SLP Plan  Continue with current plan of care      Recommendations for follow up therapy are one component of a multi-disciplinary discharge planning process, led by the attending physician.  Recommendations may be updated based on patient status, additional functional criteria and insurance authorization.    Recommendations  Diet recommendations: Other(comment);NPO (PRN water/ice) Liquids provided via: Cup;No straw Medication Administration: Via alternative means Compensations: Minimize environmental distractions;Slow rate;Small sips/bites                Oral Care Recommendations: Oral care QID;Oral care prior to ice chip/H20 Follow Up Recommendations: Other (comment) (TBD) SLP Visit Diagnosis: Dysphagia, unspecified (R13.10) Plan: Continue with current plan of care           Sonia Baller, MA, CCC-SLP Speech Therapy

## 2022-06-30 ENCOUNTER — Inpatient Hospital Stay (HOSPITAL_COMMUNITY): Payer: PPO

## 2022-06-30 DIAGNOSIS — E1011 Type 1 diabetes mellitus with ketoacidosis with coma: Secondary | ICD-10-CM | POA: Diagnosis not present

## 2022-06-30 LAB — GLUCOSE, CAPILLARY
Glucose-Capillary: 129 mg/dL — ABNORMAL HIGH (ref 70–99)
Glucose-Capillary: 217 mg/dL — ABNORMAL HIGH (ref 70–99)
Glucose-Capillary: 218 mg/dL — ABNORMAL HIGH (ref 70–99)
Glucose-Capillary: 225 mg/dL — ABNORMAL HIGH (ref 70–99)
Glucose-Capillary: 228 mg/dL — ABNORMAL HIGH (ref 70–99)
Glucose-Capillary: 278 mg/dL — ABNORMAL HIGH (ref 70–99)

## 2022-06-30 LAB — BASIC METABOLIC PANEL
Anion gap: 8 (ref 5–15)
BUN: 38 mg/dL — ABNORMAL HIGH (ref 8–23)
CO2: 28 mmol/L (ref 22–32)
Calcium: 8.7 mg/dL — ABNORMAL LOW (ref 8.9–10.3)
Chloride: 109 mmol/L (ref 98–111)
Creatinine, Ser: 0.96 mg/dL (ref 0.44–1.00)
GFR, Estimated: 59 mL/min — ABNORMAL LOW (ref >60)
Glucose, Bld: 211 mg/dL — ABNORMAL HIGH (ref 70–99)
Potassium: 3.6 mmol/L (ref 3.5–5.1)
Sodium: 145 mmol/L (ref 135–145)

## 2022-06-30 LAB — CBC
HCT: 32 % — ABNORMAL LOW (ref 36.0–46.0)
Hemoglobin: 10.3 g/dL — ABNORMAL LOW (ref 12.0–15.0)
MCH: 30.7 pg (ref 26.0–34.0)
MCHC: 32.2 g/dL (ref 30.0–36.0)
MCV: 95.2 fL (ref 80.0–100.0)
Platelets: 800 10*3/uL — ABNORMAL HIGH (ref 150–400)
RBC: 3.36 MIL/uL — ABNORMAL LOW (ref 3.87–5.11)
RDW: 15.7 % — ABNORMAL HIGH (ref 11.5–15.5)
WBC: 15 10*3/uL — ABNORMAL HIGH (ref 4.0–10.5)
nRBC: 0 % (ref 0.0–0.2)

## 2022-06-30 MED ORDER — ADULT MULTIVITAMIN W/MINERALS CH
1.0000 | ORAL_TABLET | Freq: Every day | ORAL | Status: DC
  Administered 2022-07-01 – 2022-07-04 (×4): 1 via ORAL
  Filled 2022-06-30 (×4): qty 1

## 2022-06-30 MED ORDER — INSULIN ASPART 100 UNIT/ML IJ SOLN
0.0000 [IU] | Freq: Three times a day (TID) | INTRAMUSCULAR | Status: DC
  Administered 2022-06-30 – 2022-07-01 (×2): 7 [IU] via SUBCUTANEOUS

## 2022-06-30 MED ORDER — AMITRIPTYLINE HCL 25 MG PO TABS
50.0000 mg | ORAL_TABLET | Freq: Every day | ORAL | Status: DC
  Administered 2022-06-30 – 2022-07-03 (×4): 50 mg via ORAL
  Filled 2022-06-30 (×4): qty 2

## 2022-06-30 MED ORDER — GABAPENTIN 250 MG/5ML PO SOLN
300.0000 mg | Freq: Two times a day (BID) | ORAL | Status: DC
  Administered 2022-06-30 – 2022-07-04 (×8): 300 mg via ORAL
  Filled 2022-06-30 (×10): qty 6

## 2022-06-30 MED ORDER — INSULIN ASPART 100 UNIT/ML IJ SOLN: 0.0000 [IU] | Freq: Every day | INTRAMUSCULAR | Status: DC

## 2022-06-30 MED ORDER — INSULIN ASPART 100 UNIT/ML IJ SOLN: 0.0000 [IU] | Freq: Three times a day (TID) | INTRAMUSCULAR | Status: DC

## 2022-06-30 MED ORDER — ACETAMINOPHEN 325 MG PO TABS: 650.0000 mg | ORAL_TABLET | ORAL | Status: DC | PRN

## 2022-06-30 NOTE — Plan of Care (Signed)
  Problem: Safety: Goal: Non-violent Restraint(s) Outcome: Progressing   Problem: Activity: Goal: Ability to tolerate increased activity will improve Outcome: Progressing   Problem: Respiratory: Goal: Ability to maintain a clear airway and adequate ventilation will improve Outcome: Progressing   Problem: Role Relationship: Goal: Method of communication will improve Outcome: Progressing   Problem: Education: Goal: Knowledge of General Education information will improve Description: Including pain rating scale, medication(s)/side effects and non-pharmacologic comfort measures Outcome: Progressing   Problem: Health Behavior/Discharge Planning: Goal: Ability to manage health-related needs will improve Outcome: Progressing   Problem: Clinical Measurements: Goal: Ability to maintain clinical measurements within normal limits will improve Outcome: Progressing Goal: Will remain free from infection Outcome: Progressing Goal: Diagnostic test results will improve Outcome: Progressing Goal: Respiratory complications will improve Outcome: Progressing Goal: Cardiovascular complication will be avoided Outcome: Progressing   Problem: Activity: Goal: Risk for activity intolerance will decrease Outcome: Progressing   Problem: Nutrition: Goal: Adequate nutrition will be maintained Outcome: Progressing   Problem: Coping: Goal: Level of anxiety will decrease Outcome: Progressing   Problem: Elimination: Goal: Will not experience complications related to bowel motility Outcome: Progressing Goal: Will not experience complications related to urinary retention Outcome: Progressing   Problem: Pain Managment: Goal: General experience of comfort will improve Outcome: Progressing   Problem: Safety: Goal: Ability to remain free from injury will improve Outcome: Progressing   Problem: Skin Integrity: Goal: Risk for impaired skin integrity will decrease Outcome: Progressing    Problem: Education: Goal: Ability to describe self-care measures that may prevent or decrease complications (Diabetes Survival Skills Education) will improve Outcome: Progressing Goal: Individualized Educational Video(s) Outcome: Progressing   Problem: Coping: Goal: Ability to adjust to condition or change in health will improve Outcome: Progressing   Problem: Fluid Volume: Goal: Ability to maintain a balanced intake and output will improve Outcome: Progressing   Problem: Health Behavior/Discharge Planning: Goal: Ability to identify and utilize available resources and services will improve Outcome: Progressing Goal: Ability to manage health-related needs will improve Outcome: Progressing   Problem: Metabolic: Goal: Ability to maintain appropriate glucose levels will improve Outcome: Progressing   Problem: Nutritional: Goal: Maintenance of adequate nutrition will improve Outcome: Progressing Goal: Progress toward achieving an optimal weight will improve Outcome: Progressing   Problem: Skin Integrity: Goal: Risk for impaired skin integrity will decrease Outcome: Progressing   Problem: Tissue Perfusion: Goal: Adequacy of tissue perfusion will improve Outcome: Progressing

## 2022-06-30 NOTE — Progress Notes (Signed)
   06/30/22 0835  What Happened  Was fall witnessed? No  Was patient injured? No  Patient found on floor  Found by Staff-comment (sitting on floor)  Stated prior activity other (comment) (in bed)  Provider Notification  Provider Name/Title Vance Gather MD  Date Provider Notified 06/30/22  Time Provider Notified 0915  Method of Notification Page  Notification Reason Fall (possible)  Provider response Evaluate remotely  Date of Provider Response 06/30/22  Time of Provider Response 0920  Follow Up  Family notified Yes - comment  Time family notified 1000  Additional tests No  Progress note created (see row info) Yes  Adult Fall Risk Assessment  Risk Factor Category (scoring not indicated) History of more than one fall within 6 months before admission (document High fall risk)  Adult Fall Risk Interventions  Required Bundle Interventions *See Row Information* High fall risk - low, moderate, and high requirements implemented  Additional Interventions PT/OT need assessed if change in mobility from baseline  Fall intervention(s) refused/Patient educated regarding refusal Bed alarm;Nonskid socks;Yellow bracelet  Screening for Fall Injury Risk (To be completed on HIGH fall risk patients) - Assessing Need for Floor Mats  Risk For Fall Injury- Criteria for Floor Mats Confusion/dementia (+NuDESC, CIWA, TBI, etc.)  Will Implement Floor Mats Yes   Patient stated she was trying to find her slippers so she wouldn't fall. Stated she was going to the bathroom. Denies any pain. No injury noted when assessed. Dr Bonner Puna will be up to see patient.

## 2022-06-30 NOTE — Progress Notes (Signed)
Modified Barium Swallow Progress Note  Patient Details  Name: Nicole Montgomery MRN: [id number] Date of Birth: 01/28/1941  Today's Date: 06/30/2022  Modified Barium Swallow completed.  Full report located under Chart Review in the Imaging Section.  Brief recommendations include the following:  Clinical Impression  Pt exhibits a moderate oropharyngeal dysphagia marked by silent aspiration with thin liquid. Oral phase marked by delayed propulsion needing intermittent cues to "swallow".  There was delayed swallow initation, reduced tongue base retraction, decreased laryngeal elevation and closure. A chin tuck posture resulted in increased aspiration with thin and initially small sips nectar did not enter airway but at end of study she could not take small sips when cues and larger sips nectar were consistently aspirated. Honey thick did not enter airway and did not leave increase pharyngeal retention. There was max residue with regular texture, prolonged mastication and obvious fatigue. Indications of reduced pharyngoesophageal segment with residue below the UES. Esophageal scan revealed stasis and what appeared to be possibly slower contractions. Recommend puree (Dys 1) and honey thick, meds crushed. SLP can work with pt on smaller sips to see if she can upgrade to nectar if consistently taking smaller sips and upgrade from puree.   Swallow Evaluation Recommendations       SLP Diet Recommendations: Dysphagia 1 (Puree) solids;Honey thick liquids   Liquid Administration via: Cup;Straw   Medication Administration: Crushed with puree   Supervision: Full supervision/cueing for compensatory strategies;Staff to assist with self feeding   Compensations: Minimize environmental distractions;Slow rate;Small sips/bites   Postural Changes: Seated upright at 90 degrees   Oral Care Recommendations: Oral care BID        Houston Siren 06/30/2022,2:06 PM

## 2022-06-30 NOTE — Progress Notes (Signed)
Per wound care orders to change dressings to arm wounds with xeroform gauze and wrap with kerlix. Wounds to Left arm have foam dressing CD&I. Will leave at this time. Pt is fidgeting and pulling at IV dressing and tele leads, she has not been picking at the foam dressings on L arm. Will continue to monitor.

## 2022-06-30 NOTE — TOC Initial Note (Signed)
Transition of Care [facility]) - Initial/Assessment Note    Patient Details  Name: Nicole Montgomery MRN: [id number] Date of Birth: 1940-11-17  Transition of Care [facility]) CM/SW Contact:    Nicole Montgomery, [place] Phone Number: 06/30/2022, 12:02 PM  Clinical Narrative:                  CSW received consult for possible SNF placement at time of discharge.Due to patients current orientation CSW spoke with patients spouse Nicole Montgomery regarding PT recommendation of SNF placement for patient at time of discharge. Patients spouse expressed understanding of PT recommendation and is agreeable to SNF placement for patient at time of discharge. Patients spouse reports preference for [facility] or [facility] . Patients spouse gave CSW permission to fax out initial referral near the [place] and [place] area for possible SNF placement. CSW informed patients spouse CSW will follow to fax out initial referral once cortrak is removed.CSW discussed insurance authorization process and will provide Medicare SNF ratings list with accepted SNF bed offers when available . No further questions reported at this time. CSW to continue to follow and assist with discharge planning needs.   Expected Discharge Plan: Skilled Nursing Facility Barriers to Discharge: Continued Medical Work up   Patient Goals and CMS Choice   CMS Medicare.gov Compare Post Acute Care list provided to:: Patient Represenative (must comment) (Patients spouse) Choice offered to / list presented to : Spouse      Expected Discharge Plan and Services In-house Referral: Clinical Social Work     Living arrangements for the past 2 months: Single Family Home                                      Prior Living Arrangements/Services Living arrangements for the past 2 months: Single Family Home Lives with:: Spouse Patient language and need for interpreter reviewed:: Yes        Need for Family Participation in Patient Care: Yes (Comment) Care giver  support system in place?: Yes (comment)   Criminal Activity/Legal Involvement Pertinent to Current Situation/Hospitalization: No - Comment as needed  Activities of Daily Living      Permission Sought/Granted Permission sought to share information with : Case Manager, Family Supports, Customer service manager Permission granted to share information with : No  Share Information with NAME: Due to patients current orientation CSW spoke with patients spouse  Permission granted to share info w AGENCY: SNF  Permission granted to share info w Relationship: Spouse  Permission granted to share info w Contact Information: "Nicole Montgomery [phone]  Emotional Assessment       Orientation: : Oriented to Self, Oriented to Place Alcohol / Substance Use: Not Applicable Psych Involvement: No (comment)  Admission diagnosis:  DKA (diabetic ketoacidosis) ([place]) [E11.10] Influenza A [J10.1] Acute respiratory failure with hypoxia ([place]) [J96.01] Diabetic ketoacidosis without coma associated with type 2 diabetes mellitus ([place]) [E11.10] Pneumonia of right lung due to infectious organism, unspecified part of lung [J18.9] Patient Active Problem List   Diagnosis Date Noted   Pneumonia of right lung due to infectious organism 06/22/2022   Malnutrition of moderate degree 06/19/2022   On mechanically assisted ventilation ([place]) 06/18/2022   Pressure injury of skin 06/18/2022   DKA (diabetic ketoacidosis) ([place]) 06/17/2022   Influenza A 06/17/2022   Acute respiratory failure with hypoxia ([place]) 06/17/2022   Pain in left foot 01/28/2017   Plantar fasciitis, left 01/28/2017  Essential hypertension 04/12/2015   Gastroesophageal reflux disease without esophagitis 04/12/2015   Hyperlipidemia, unspecified 04/12/2015   Postherpetic trigeminal neuralgia 01/13/2013   PCP:  Nicole Collie, MD Pharmacy:   [facility], [address] [address] Phone:  [phone] Fax: [phone]     Social Determinants of Health (SDOH) Social History: SDOH Screenings   Tobacco Use: Low Risk  (06/27/2022)   SDOH Interventions:     Readmission Risk Interventions     No data to display

## 2022-06-30 NOTE — Progress Notes (Signed)
Unable to complete Admit H&P at this time. Pt is confused and no family at the bedside. Jessie Foot, RN

## 2022-06-30 NOTE — Plan of Care (Signed)
  Problem: Clinical Measurements: Goal: Ability to maintain clinical measurements within normal limits will improve Outcome: Progressing Goal: Diagnostic test results will improve Outcome: Progressing Goal: Cardiovascular complication will be avoided Outcome: Progressing   Problem: Coping: Goal: Level of anxiety will decrease Outcome: Progressing   Problem: Elimination: Goal: Will not experience complications related to bowel motility Outcome: Progressing-rectal tube remains in place

## 2022-06-30 NOTE — Progress Notes (Addendum)
Speech Language Pathology Treatment: Dysphagia  Patient Details Name: Nicole Montgomery MRN: [id number] DOB: March 10, 1941 Today's Date: 06/30/2022 Time: 9563-8756 SLP Time Calculation (min) (ACUTE ONLY): 16 min  Assessment / Plan / Recommendation Clinical Impression  Pt seen for dysphagia treatment- no family present. She was alert and agreeable for trials water (spoon, cup, straw) and puree and denied odonophagia. Swallow onset appeared functional. There was one delayed cough with larger sip at end of session and therapist suspects decreased laryngeal closure leading to airway compromise with MBS recommended to further evaluate and make recommendations given prolonged intubation. SLP in process of scheduling MBS for first available. Will notify RN when time is secured. Recommend continue tube feeds, sips water after oral care.   ADDENDUM: Pt scheduled for MBS at 1300.   HPI HPI: Pt is a 82 y.o. female who presents with altered mental status, flu + and in DKA. Her husband is at the bedside and provides history-states that she began showing signs of confusion approximately one week ago.  She has had reduced appetite and barely been eating or drinking. ETT 1/3- 1/11 for sedation and O2 needs. CXR (06/22/22) revealed "Decreasing bilateral airspace disease". PMH: Type 2 DM on Ozempic, previous pancreatic adenocarcinoma, status post pancreatectomy, trigeminal Neuralgia, HL who follows with PCP in .      SLP Plan  MBS;Continue with current plan of care      Recommendations for follow up therapy are one component of a multi-disciplinary discharge planning process, led by the attending physician.  Recommendations may be updated based on patient status, additional functional criteria and insurance authorization.    Recommendations  Diet recommendations: NPO;Other(comment) (sips water) Liquids provided via: Cup Medication Administration: Via alternative means Compensations: Minimize environmental  distractions;Slow rate;Small sips/bites Postural Changes and/or Swallow Maneuvers: Seated upright 90 degrees                Oral Care Recommendations: Oral care QID Follow Up Recommendations:  (TBD) Assistance recommended at discharge: Frequent or constant Supervision/Assistance SLP Visit Diagnosis: Dysphagia, unspecified (R13.10) Plan: MBS;Continue with current plan of care           Houston Siren  06/30/2022, 9:49 AM

## 2022-06-30 NOTE — Progress Notes (Signed)
[facility] PROGRESS NOTE  Nicole Montgomery (DOB: 02-21-1941) VPX:[id number] PCP: Marco Collie, MD  Brief Narrative: Nicole Montgomery is a 82 y.o. F with PMH significant for Type 2 DM on Ozempic, previous pancreatic adenocarcinoma, status post partial pancreatectomy, trigeminal Neuralgia, HL who follows with PCP in [place] who presents with altered mental status.  Her husband is at the bedside and provides history-states that she began showing signs of confusion approximately one week ago.  She has had reduced appetite and barely been eating or drinking.  Her confusion worsened to the point she was not responsive on the toilet and so he called EMS who noted glucose >600.   He notes they cared for grandchildren last week who had the flu.  She also has been coughing and short of breath without fever, though had a fall from standing on 12/31.  She did not lose consciousness at that time and did not want to come to the hospital.    In the ED, she was hypertensive, tachycardic, hypoxic requiring [place] and agitated.  CTH was completed without acute findings, labs significant for glucose >1200, bicarb 12, gap 20, beta hydroxy 1.6, WBC 11.3, pH 7.9 and flu A+ with R-sided opacities on CXR.  Her husband states she may have missed her last ozempic injection secondary to her confusion.  PCCM consulted for admission given her AMS   Significant Events 1/2 presented with AMS, flu + and in DKA with significant agitation requiring precedex.  Started on antibiotic.  1/3 intubated for sedation needs and oxygen requirement. 1/4 transition from insulin drip to subQ  1/6 restarted on insulin overnight for hyperglycemia but anion gap remains closed.  Mild vent dyssynchrony with inability to tolerate SBT 1/7 no acute events overnight, glucose remains elevated will need up titration of insulin management.  Tolerating SBT trial this a.m. diuresed almost 5 L with use of diuretics 1/8 start Zosyn  1/10 more awake today, off  propofol  1/11 QTC 511 overnight, was extubated today 1/12 awake, able to follow commands and move extremities, stopped Precedex, cortrak placed, tube feeds  1/13 on [place] at 4L  Subjective: No new issues per patient. Husband at bedside. RN reports finding patient on ground near foot of the bed without complaints. Stating she was trying to find her slippers.   Objective: BP (!) 163/90 (BP Location: Left Arm)   Pulse 99   Temp 98.1 F (36.7 C) (Oral)   Resp 20   Ht 5' (1.524 m)   Wt 55 kg   SpO2 99%   BMI 23.68 kg/m   Gen: Elderly, no distress Pulm: Coarse, nonlabored  CV: RRR, no MRG. Trace pitting edema GI: Soft, NT, ND, +BS  Neuro: Alert and incompletely oriented. KNows she's in the hospital, which one, but not why. Thinks it's 2015. No new focal deficits. Ext: Warm, no deformities Skin: No new rashes, lesions or ulcers on visualized skin   Assessment & Plan:   Type 2 Diabetes  DKA  Pancreatic adenocarcinoma s/p partial pancreatectomy (2012) A1c 10.1%. PTA listed as taking Ozempic. - Change SSI to AC/HS as we DC cortrak (passed swallow eval/MBS 1/15).    Acute hypoxic respiratory failure secondary to Influenza A with superimposed bacterial pneumonia and volume overload Chronic hypercarbic respiratory failure CXR 1/12 with patchy infiltrates in R and L lung base 1/7 Trach culture: G - rods and G + cocci > few staph epidermidis and candida albicans 1/3 Blood culture: negative - Completed zosyn 5 days. Given note of  silent aspiration on MBS, suspect aspiration pneumonitis is causing persistent leukocytosis and hypoxia. Will monitor on modified diet.  - Continue to wean O2 as tolerated, currently on 4 L    Dysphagia  Moderate protein calorie malnutrition - MBS 1/15 > moderate oropharyngeal dysphagia w/silent aspiration of thin liquid. Starting dysphagia 1 (pureed solids) with honey thickened liquids.    Acute encephalopathy: 1/4 MRI brain unremarkable. - Continue delirium  precautions   Trigeminal neuralgia: - Continue home amitriptyline, duloxetine, gabapentin 300 mg BID   Prolonged QTc: Repeat EKG showed QT 494.  Repeat EKG intermittently  Caution with QT prolonging meds   AKI: Creatinine has continued improving.  - Avoid nephrotoxins.    Hypokalemia: Resolved.  - Monitor    Normocytic anemia: Stable  Reactive thrombocytosis: Increasing, will monitor.  Unwitnessed fall: 1/15 Suspect low energy mechanism with no apparent injury.  - Bed alarm confirmed - Fall precautions - PT/OT  Patrecia Pour, MD [facility] [email or web address] 06/30/2022, 2:47 PM

## 2022-06-30 NOTE — Progress Notes (Addendum)
Inpatient Diabetes Program Recommendations  AACE/ADA: New Consensus Statement on Inpatient Glycemic Control (2015)  Target Ranges:  Prepandial:   less than 140 mg/dL      Peak postprandial:   less than 180 mg/dL (1-2 hours)      Critically ill patients:  140 - 180 mg/dL   Lab Results  Component Value Date   GLUCAP 278 (H) 06/30/2022   HGBA1C 10.1 (H) 06/22/2022    Review of Glycemic Control  Latest Reference Range & Units 06/30/22 05:06 06/30/22 07:43 06/30/22 11:41  Glucose-Capillary 70 - 99 mg/dL 228 (H) 218 (H) 278 (H)   Diabetes history: DM 2 Outpatient Diabetes medications: Ozempic 0.5 mg weekly Current orders for Inpatient glycemic control:  Novolog 0-20 units q 4 hours Osmolite 50 ml/hr Levemir 13 units bid  Inpatient Diabetes Program Recommendations:    Note elevated A1C.  Patient is currently still on tube feeds.  Consider adding Novolog tube feed coverage 2 units q 4 hours.  Will likely need insulin at d/c.   Thanks,  Adah Perl, RN, BC-ADM Inpatient Diabetes Coordinator Pager [phone]  (8a-5p)  1500- spoke to patient regarding home DM medications.  Per RN patient is now starting a PO diet.  Patient states that she was taking Ozempic at home but she was not on any other medications.  Discussed A1C results and potential need for insulin.  Needs reinforcement when husband is here visiting.  RN states patient is much more alert this afternoon.  Will follow up on 07/01/22.

## 2022-07-01 DIAGNOSIS — E1011 Type 1 diabetes mellitus with ketoacidosis with coma: Secondary | ICD-10-CM | POA: Diagnosis not present

## 2022-07-01 LAB — BASIC METABOLIC PANEL
Anion gap: 8 (ref 5–15)
BUN: 33 mg/dL — ABNORMAL HIGH (ref 8–23)
CO2: 28 mmol/L (ref 22–32)
Calcium: 8.9 mg/dL (ref 8.9–10.3)
Chloride: 107 mmol/L (ref 98–111)
Creatinine, Ser: 0.81 mg/dL (ref 0.44–1.00)
GFR, Estimated: >60 mL/min (ref >60)
Glucose, Bld: 53 mg/dL — ABNORMAL LOW (ref 70–99)
Potassium: 2.9 mmol/L — ABNORMAL LOW (ref 3.5–5.1)
Sodium: 143 mmol/L (ref 135–145)

## 2022-07-01 LAB — GLUCOSE, CAPILLARY
Glucose-Capillary: 164 mg/dL — ABNORMAL HIGH (ref 70–99)
Glucose-Capillary: 165 mg/dL — ABNORMAL HIGH (ref 70–99)
Glucose-Capillary: 194 mg/dL — ABNORMAL HIGH (ref 70–99)
Glucose-Capillary: 206 mg/dL — ABNORMAL HIGH (ref 70–99)
Glucose-Capillary: 35 mg/dL — CL (ref 70–99)
Glucose-Capillary: 36 mg/dL — CL (ref 70–99)
Glucose-Capillary: 54 mg/dL — ABNORMAL LOW (ref 70–99)
Glucose-Capillary: 66 mg/dL — ABNORMAL LOW (ref 70–99)
Glucose-Capillary: 87 mg/dL (ref 70–99)

## 2022-07-01 LAB — CBC
HCT: 32.7 % — ABNORMAL LOW (ref 36.0–46.0)
Hemoglobin: 10.5 g/dL — ABNORMAL LOW (ref 12.0–15.0)
MCH: 30.5 pg (ref 26.0–34.0)
MCHC: 32.1 g/dL (ref 30.0–36.0)
MCV: 95.1 fL (ref 80.0–100.0)
Platelets: 835 10*3/uL — ABNORMAL HIGH (ref 150–400)
RBC: 3.44 MIL/uL — ABNORMAL LOW (ref 3.87–5.11)
RDW: 15.8 % — ABNORMAL HIGH (ref 11.5–15.5)
WBC: 15.6 10*3/uL — ABNORMAL HIGH (ref 4.0–10.5)
nRBC: 0 % (ref 0.0–0.2)

## 2022-07-01 MED ORDER — POTASSIUM CHLORIDE CRYS ER 20 MEQ PO TBCR
40.0000 meq | EXTENDED_RELEASE_TABLET | Freq: Once | ORAL | Status: AC
  Administered 2022-07-01: 40 meq via ORAL
  Filled 2022-07-01: qty 2

## 2022-07-01 MED ORDER — INSULIN ASPART 100 UNIT/ML IJ SOLN
0.0000 [IU] | Freq: Three times a day (TID) | INTRAMUSCULAR | Status: DC
  Administered 2022-07-01: 2 [IU] via SUBCUTANEOUS
  Administered 2022-07-02: 3 [IU] via SUBCUTANEOUS
  Administered 2022-07-02: 5 [IU] via SUBCUTANEOUS
  Administered 2022-07-03: 3 [IU] via SUBCUTANEOUS
  Administered 2022-07-03: 5 [IU] via SUBCUTANEOUS
  Administered 2022-07-03: 3 [IU] via SUBCUTANEOUS
  Administered 2022-07-04: 7 [IU] via SUBCUTANEOUS
  Administered 2022-07-04: 3 [IU] via SUBCUTANEOUS

## 2022-07-01 MED ORDER — SULFAMETHOXAZOLE-TRIMETHOPRIM 800-160 MG PO TABS
1.0000 | ORAL_TABLET | Freq: Two times a day (BID) | ORAL | Status: DC
  Administered 2022-07-01 – 2022-07-02 (×4): 1 via ORAL
  Filled 2022-07-01 (×5): qty 1

## 2022-07-01 MED ORDER — IPRATROPIUM-ALBUTEROL 0.5-2.5 (3) MG/3ML IN SOLN
3.0000 mL | Freq: Two times a day (BID) | RESPIRATORY_TRACT | Status: DC
  Administered 2022-07-01 – 2022-07-02 (×2): 3 mL via RESPIRATORY_TRACT
  Filled 2022-07-01 (×3): qty 3

## 2022-07-01 MED ORDER — DEXTROSE 50 % IV SOLN
25.0000 g | Freq: Once | INTRAVENOUS | Status: AC
  Administered 2022-07-01: 25 g via INTRAVENOUS

## 2022-07-01 MED ORDER — INSULIN DETEMIR 100 UNIT/ML ~~LOC~~ SOLN
10.0000 [IU] | Freq: Every day | SUBCUTANEOUS | Status: DC
  Filled 2022-07-01 (×2): qty 0.1

## 2022-07-01 MED ORDER — DEXTROSE 50 % IV SOLN
INTRAVENOUS | Status: AC
  Filled 2022-07-01: qty 50

## 2022-07-01 MED ORDER — INSULIN ASPART 100 UNIT/ML IJ SOLN: 0.0000 [IU] | Freq: Every day | INTRAMUSCULAR | Status: DC

## 2022-07-01 NOTE — Progress Notes (Addendum)
Physical Therapy Treatment Patient Details Name: Nicole Montgomery MRN: [id number] DOB: 10/04/1940 Today's Date: 07/01/2022   History of Present Illness 82 yo female admitted 1/2 with AMS, DKA, flu (+). Intubated 1/3-1/11. PMhx: T2DM, trigeminal neuralgia, HLD, HTN    PT Comments    Pt is progressing towards goals. She has improved significantly since her initial evaluation. CGA for transfers and supervision for bed mobility at this time. Very short distance gait due to respiratory status and increased HR. Pt most likely can ambulate further but would require O2 at this time. Due to pt current functional status, available assistance at home and PLOF recommending skilled physical therapy services in SNF setting on discharge from acute care hospital setting. Pt may progress to HHPT; will continue to follow.    Recommendations for follow up therapy are one component of a multi-disciplinary discharge planning process, led by the attending physician.  Recommendations may be updated based on patient status, additional functional criteria and insurance authorization.  Follow Up Recommendations  Skilled nursing-short term rehab (<3 hours/day) Can patient physically be transported by private vehicle: No   Assistance Recommended at Discharge Frequent or constant Supervision/Assistance  Patient can return home with the following Assistance with cooking/housework;Assist for transportation;A little help with walking and/or transfers;Help with stairs or ramp for entrance   Equipment Recommendations  Rolling walker (2 wheels);Other (comment) (defer to post acute)    Recommendations for Other Services       Precautions / Restrictions Precautions Precautions: Fall;Other (comment) Precaution Comments: flexiseal Restrictions Weight Bearing Restrictions: No     Mobility  Bed Mobility Overal bed mobility: Needs Assistance Bed Mobility: Supine to Sit     Supine to sit: Supervision, HOB elevated      General bed mobility comments: Pt requires no physical assistance for sitting EOB Patient Response: Flat affect, Cooperative  Transfers Overall transfer level: Needs assistance Equipment used: Rolling walker (2 wheels) Transfers: Sit to/from Stand, Bed to chair/wheelchair/BSC Sit to Stand: Min guard   Step pivot transfers: Min guard       General transfer comment: Pt needs extra time and verbal cues for safe hand placement. CGA for safety due to instability on feet with RW pt was CGA for transfers. Pt did get Shortness of breathe with transfer and dropped to 91% O2 sats on 1 L o2 via [place]    Ambulation/Gait Ambulation/Gait assistance: Min guard Gait Distance (Feet): 2 Feet Assistive device: Rolling walker (2 wheels) Gait Pattern/deviations: Step-through pattern, Decreased step length - right, Decreased step length - left   Gait velocity interpretation: <1.31 ft/sec, indicative of household ambulator   General Gait Details: Low foot clearance. Pt was slightly unstable in standing CGA for safety with no overt loss of balance. Pt requires extra time and was short of breathe on sitting. O2 sats remained above 90% on 1 L O2 via [place]       Balance Overall balance assessment: Needs assistance Sitting-balance support: No upper extremity supported, Feet supported Sitting balance-Leahy Scale: Good Sitting balance - Comments: Pt was able to sit EOB for ~5 min without difficulty.   Standing balance support: Bilateral upper extremity supported, During functional activity Standing balance-Leahy Scale: Fair Standing balance comment: Pt needs UE support to maintain balance.          Cognition Arousal/Alertness: Awake/alert   Overall Cognitive Status: Impaired/Different from baseline Area of Impairment: Orientation, Attention, Safety/judgement, Awareness         Following Commands: Follows one step commands consistently Safety/Judgement:  Decreased awareness of safety, Decreased  awareness of deficits   Problem Solving: Slow processing, Difficulty sequencing, Requires verbal cues, Requires tactile cues             General Comments General comments (skin integrity, edema, etc.): Pt was not on O2 when entered room. [place] on bed O2 sats 96-97% when pt began moving HR up to 140 bpm sitting EOB with O2 sats at 94% with O2 on 1 L pt HR down to 100 bpm. Pt was short of breathe after transfering on 1 L O2 via .      Pertinent Vitals/Pain Pain Assessment Pain Assessment: No/denies pain     PT Goals (current goals can now be found in the care plan section) Acute Rehab PT Goals Time For Goal Achievement: 07/11/22 Potential to Achieve Goals: Fair Progress towards PT goals: Progressing toward goals    Frequency    Min 3X/week      PT Plan Current plan remains appropriate       AM-PAC PT "6 Clicks" Mobility   Outcome Measure  Help needed turning from your back to your side while in a flat bed without using bedrails?: A Little Help needed moving from lying on your back to sitting on the side of a flat bed without using bedrails?: A Little Help needed moving to and from a bed to a chair (including a wheelchair)?: A Little Help needed standing up from a chair using your arms (e.g., wheelchair or bedside chair)?: A Little Help needed to walk in hospital room?: A Lot Help needed climbing 3-5 steps with a railing? : A Lot 6 Click Score: 16    End of Session   Activity Tolerance: Patient limited by fatigue Patient left: in chair;with call bell/phone within reach;with chair alarm set Nurse Communication: Mobility status PT Visit Diagnosis: Other abnormalities of gait and mobility (R26.89);Muscle weakness (generalized) (M62.81)     Time: 9678-9381 PT Time Calculation (min) (ACUTE ONLY): 28 min  Charges:  $Therapeutic Activity: 23-37 mins                     Tomma Rakers, DPT, CLT  Acute Rehabilitation Services Office: [phone] (Secure chat  preferred)    Ander Purpura 07/01/2022, 10:30 AM

## 2022-07-01 NOTE — NC FL2 (Signed)
[place] LEVEL OF CARE FORM     IDENTIFICATION  Patient Name: Nicole Montgomery Birthdate: 1940/07/28 Sex: female Admission Date (Current Location): 06/17/2022  [facility] and [place] Number:  Herbalist and Address:  [facility], [place] [address] [place], [place] [address]      Provider Number: [id number]  Attending Physician Name and Address:  Patrecia Pour, MD  Relative Name and Phone Number:  Gardiner Ramus" (Spouse) [phone]    Current Level of Care: Hospital Recommended Level of Care: Cut Off Prior Approval Number:    Date Approved/Denied:   PASRR Number: [id number]  Discharge Plan: SNF    Current Diagnoses: Patient Active Problem List   Diagnosis Date Noted   Pneumonia of right lung due to infectious organism 06/22/2022   Malnutrition of moderate degree 06/19/2022   On mechanically assisted ventilation ([place]) 06/18/2022   Pressure injury of skin 06/18/2022   DKA (diabetic ketoacidosis) ([place]) 06/17/2022   Influenza A 06/17/2022   Acute respiratory failure with hypoxia ([place]) 06/17/2022   Pain in left foot 01/28/2017   Plantar fasciitis, left 01/28/2017   Essential hypertension 04/12/2015   Gastroesophageal reflux disease without esophagitis 04/12/2015   Hyperlipidemia, unspecified 04/12/2015   Postherpetic trigeminal neuralgia 01/13/2013    Orientation RESPIRATION BLADDER Height & Weight     Self, Place  O2 (Nasal Cannula 3 liters) Incontinent (Urethral Catheter) Weight: 129 lb 10.1 oz (58.8 kg) Height:  5' (152.4 cm)  BEHAVIORAL SYMPTOMS/MOOD NEUROLOGICAL BOWEL NUTRITION STATUS      Incontinent Diet (Please see discharge summary)  AMBULATORY STATUS COMMUNICATION OF NEEDS Skin   Extensive Assist Verbally Other (Comment) (Dry,Flaky,Abrasion,face,Bilateral,Ecchymosis,Abdomen,R,Erythema,Buttocks,Wound/Incision LDAs,PI Wrist distal,L,Lower,posterior stage 1,PI arm,anterior,L,Lower stage 3,PI  Face,R,stage 2)                       Personal Care Assistance Level of Assistance  Bathing, Feeding, Dressing Bathing Assistance: Maximum assistance Feeding assistance: Independent Dressing Assistance: Maximum assistance     Functional Limitations Info  Sight, Hearing, Speech Sight Info: Impaired Hearing Info: Adequate (WDL) Speech Info: Adequate (WDL)    SPECIAL CARE FACTORS FREQUENCY  PT (By licensed PT), OT (By licensed OT)     PT Frequency: 5x min weekly OT Frequency: 5x min weekly            Contractures Contractures Info: Not present    Additional Factors Info  Code Status, Allergies, Psychotropic, Insulin Sliding Scale Code Status Info: FULL Allergies Info: Codeine Psychotropic Info: DULoxetine (CYMBALTA) DR capsule 60 mg daily,amitriptyline (ELAVIL) tablet 50 mg daily Insulin Sliding Scale Info: insulin aspart (novoLOG) injection 0-20 Units 3 times daily with meals,insulin aspart (novoLOG) injection 0-5 Units daily at bedtime,insulin detemir (LEVEMIR) injection 10 Units daily at bedtime       Current Medications (07/01/2022):  This is the current hospital active medication list Current Facility-Administered Medications  Medication Dose Route Frequency Provider Last Rate Last Admin   0.9 %  sodium chloride infusion   Intravenous PRN Margaretha Seeds, MD 10 mL/hr at 06/25/22 2142 Restarted at 06/25/22 2142   acetaminophen (TYLENOL) tablet 650 mg  650 mg Oral Q4H PRN Patrecia Pour, MD       albuterol (PROVENTIL) (2.5 MG/3ML) 0.083% nebulizer solution 2.5 mg  2.5 mg Nebulization Q4H PRN Gleason, Otilio Carpen, PA-C   2.5 mg at 06/23/22 0802   amitriptyline (ELAVIL) tablet 50 mg  50 mg Oral QHS Patrecia Pour, MD  50 mg at 06/30/22 2102   Chlorhexidine Gluconate Cloth 2 % PADS 6 each  6 each Topical Daily Olalere, Adewale A, MD   6 each at 06/30/22 1000   DULoxetine (CYMBALTA) DR capsule 60 mg  60 mg Oral Daily Kara Mead V, MD   60 mg at 06/28/22 1021   gabapentin  (NEURONTIN) 250 MG/5ML solution 300 mg  300 mg Oral Q12H Vance Gather B, MD   300 mg at 06/30/22 2102   heparin injection 5,000 Units  5,000 Units Subcutaneous Q8H Gleason, Otilio Carpen, PA-C   5,000 Units at 07/01/22 0520   insulin aspart (novoLOG) injection 0-20 Units  0-20 Units Subcutaneous TID WC Vance Gather B, MD   7 Units at 06/30/22 1850   insulin aspart (novoLOG) injection 0-5 Units  0-5 Units Subcutaneous QHS Patrecia Pour, MD       insulin detemir (LEVEMIR) injection 10 Units  10 Units Subcutaneous QHS Patrecia Pour, MD       labetalol (NORMODYNE) injection 5 mg  5 mg Intravenous Q2H PRN Angelique Blonder, DO       multivitamin with minerals tablet 1 tablet  1 tablet Oral Daily Patrecia Pour, MD       Oral care mouth rinse  15 mL Mouth Rinse PRN Margaretha Seeds, MD       sulfamethoxazole-trimethoprim (BACTRIM DS) 800-160 MG per tablet 1 tablet  1 tablet Oral Q12H Patrecia Pour, MD       white petrolatum (VASELINE) gel   Topical PRN Margaretha Seeds, MD         Discharge Medications: Please see discharge summary for a list of discharge medications.  Relevant Imaging Results:  Relevant Lab Results:   Additional Information [phone]  Milas Gain, LCSWA

## 2022-07-01 NOTE — Progress Notes (Signed)
Patient cbg 35 rechecked was 74 notified Dr. Bonner Puna and patient given 1 amp 50 % dextrose IV. Patient alert, oriented to self and place.

## 2022-07-01 NOTE — Progress Notes (Signed)
Speech Language Pathology Treatment: Dysphagia  Patient Details Name: Nicole Montgomery MRN: [id number] DOB: [birth year] Today's Date: 07/01/2022 Time: 4680-3212 SLP Time Calculation (min) (ACUTE ONLY): 12 min  Assessment / Plan / Recommendation Clinical Impression  Pt seen for dysphagia treatment following yesterday's MBS with husband present and reviewed MBS results. He and RN report that pt was sitting in chair earlier, able to reach bottle of sterile water which she drank (uncertain of amount). Observed her with honey thick juice and puree and husband observed how to mix. Functional oral transit with timely pharyngeal swallows without signs of aspiration noted. Will try a higher texture solid on next visit. Continue puree and honey thick liquids and crush pills (if able).    HPI HPI: Pt is a 82 y.o. female who presents with altered mental status, flu + and in DKA. Her husband is at the bedside and provides history-states that she began showing signs of confusion approximately one week ago.  She has had reduced appetite and barely been eating or drinking. ETT 1/3- 1/11 for sedation and O2 needs. CXR (06/22/22) revealed "Decreasing bilateral airspace disease". PMH: Type 2 DM on Ozempic, previous pancreatic adenocarcinoma, status post pancreatectomy, trigeminal Neuralgia, HL who follows with PCP in .      SLP Plan  Continue with current plan of care      Recommendations for follow up therapy are one component of a multi-disciplinary discharge planning process, led by the attending physician.  Recommendations may be updated based on patient status, additional functional criteria and insurance authorization.    Recommendations  Diet recommendations: Dysphagia 1 (puree);Honey-thick liquid Liquids provided via: Cup Medication Administration: Crushed with puree Supervision: Staff to assist with self feeding Compensations: Minimize environmental distractions;Slow rate;Small sips/bites Postural  Changes and/or Swallow Maneuvers: Seated upright 90 degrees                Oral Care Recommendations: Oral care BID Follow Up Recommendations: Skilled nursing-short term rehab (<3 hours/day) Assistance recommended at discharge: Frequent or constant Supervision/Assistance SLP Visit Diagnosis: Dysphagia, oropharyngeal phase (R13.12) Plan: Continue with current plan of care           Houston Siren  07/01/2022, 2:09 PM

## 2022-07-01 NOTE — Progress Notes (Signed)
Mobility Specialist - Progress Note   07/01/22 1230  Mobility  Activity Transferred to/from [facility]  Level of Assistance Minimal assist, patient does 75% or more  Assistive Device None  Activity Response Tolerated well  Mobility Referral Yes  $Mobility charge 1 Mobility   Pt was received on BSC and needing assistance to chair. Pt declined further ambulation d/t the arrival of lunch. Pt was returned to chair with all needs met and chair alarm on.   Nicole Montgomery  Mobility Specialist Please contact via Solicitor or Rehab office at [phone]

## 2022-07-01 NOTE — Progress Notes (Signed)
[facility] PROGRESS NOTE  Nicole Montgomery (DOB: Feb 10, 1941) XTK:[id number] PCP: Marco Collie, MD  Brief Narrative: Nicole Montgomery is a 82 y.o. F with PMH significant for Type 2 DM on Ozempic, previous pancreatic adenocarcinoma, status post partial pancreatectomy, trigeminal Neuralgia, HL who follows with PCP in [place] who presents with altered mental status.  Her husband is at the bedside and provides history-states that she began showing signs of confusion approximately one week ago.  She has had reduced appetite and barely been eating or drinking.  Her confusion worsened to the point she was not responsive on the toilet and so he called EMS who noted glucose >600.   He notes they cared for grandchildren last week who had the flu.  She also has been coughing and short of breath without fever, though had a fall from standing on 12/31.  She did not lose consciousness at that time and did not want to come to the hospital.    In the ED, she was hypertensive, tachycardic, hypoxic requiring [place] and agitated.  CTH was completed without acute findings, labs significant for glucose >1200, bicarb 12, gap 20, beta hydroxy 1.6, WBC 11.3, pH 7.9 and flu A+ with R-sided opacities on CXR.  Her husband states she may have missed her last ozempic injection secondary to her confusion.  PCCM consulted for admission given her AMS   Significant Events 1/2 presented with AMS, flu + and in DKA with significant agitation requiring precedex.  Started on antibiotic.  1/3 intubated for sedation needs and oxygen requirement. 1/4 transition from insulin drip to subQ  1/6 restarted on insulin overnight for hyperglycemia but anion gap remains closed.  Mild vent dyssynchrony with inability to tolerate SBT 1/7 no acute events overnight, glucose remains elevated will need up titration of insulin management.  Tolerating SBT trial this a.m. diuresed almost 5 L with use of diuretics 1/8 start Zosyn  1/10 more awake today, off  propofol  1/11 QTC 511 overnight, was extubated today 1/12 awake, able to follow commands and move extremities, stopped Precedex, cortrak placed, tube feeds  1/13 on [place] at 4L 1/16 Mentation improving, still delirium. Still hypoxic with leukocytosis, starting bactrim per respiratory culture results. Pursuing SNF.  Subjective: Pt more alert and interactive today, still "talking out of her head" at times per husband. She had CBG of 35 this morning any was reportedly asymptomatic with this. She denies complaints. Rectal tube still in this morning.  Objective: BP (!) 141/88 (BP Location: Right Arm)   Pulse 96   Temp 97.8 F (36.6 C) (Axillary)   Resp 16   Ht 5' (1.524 m)   Wt 58.8 kg   SpO2 91%   BMI 25.32 kg/m   Gen: Elderly frail female in no distress Pulm: Rhonci/wheezing diffusely, nonlabored with supplemental oxygen  CV: RRR, no MRG, no edema GI: Soft, NT, ND, +BS Neuro: Alert and incompletely oriented. No new focal deficits. Ext: Warm, no deformities Skin: No new rashes, lesions or ulcers on visualized skin   Assessment & Plan:   Type 2 Diabetes  DKA  Pancreatic adenocarcinoma s/p partial pancreatectomy (2012) A1c 10.1%. PTA listed as taking Ozempic. - Deescalate basal insulin given severe hypoglycemia and ongoing limited po intake. Continue SSI.    Acute hypoxic respiratory failure secondary to Influenza A with superimposed bacterial pneumonia and volume overload Chronic hypercarbic respiratory failure CXR 1/12 with patchy infiltrates in R and L lung base 1/7 Trach culture: G - rods and G + cocci >  few staph epidermidis and candida albicans.  - Note persistent leukocytosis (may be pneumonitis) with respiratory symptoms. S. epi had some resistance. Will start bactrim and monitor response.  - Completed zosyn 5 days. Given note of silent aspiration on MBS, suspect aspiration pneumonitis is causing persistent leukocytosis and hypoxia. Will monitor on modified diet.  - Continue  to wean O2 as tolerated, currently on 4 L. Wheezing developing, add nebs BID and prn.  Hypokalemia: Due to inadequate po intake.  - Supplement today and monitor in AM.    Dysphagia  Moderate protein calorie malnutrition - MBS 1/15 > moderate oropharyngeal dysphagia w/silent aspiration of thin liquid. Starting dysphagia 1 (pureed solids) with honey thickened liquids.  - Now that tube feeds DC'ed, DC rectal tube.   Acute encephalopathy: 1/4 MRI brain unremarkable. - Continue delirium precautions   Trigeminal neuralgia: - Continue home amitriptyline, duloxetine, gabapentin 300 mg BID   Prolonged QTc: Repeat EKG showed QT 494.  Repeat EKG intermittently  Caution with QT prolonging meds   AKI: Creatinine has continued improving.  - Avoid nephrotoxins.    Hypokalemia: Resolved.  - Monitor    Normocytic anemia: Stable  Reactive thrombocytosis: Increasing, will monitor.  Unwitnessed fall: 1/15 Suspect low energy mechanism with no apparent injury.  - Bed alarm confirmed - Fall precautions - PT/OT  Patrecia Pour, MD [facility] [email or web address] 07/01/2022, 1:59 PM

## 2022-07-01 NOTE — Progress Notes (Signed)
Unable to complete patients admission assessment. Patient oriented to self only and unable to answer questions. Will attempt with family when able.

## 2022-07-01 NOTE — Progress Notes (Signed)
Inpatient Diabetes Program Recommendations  AACE/ADA: New Consensus Statement on Inpatient Glycemic Control (2015)  Target Ranges:  Prepandial:   less than 140 mg/dL      Peak postprandial:   less than 180 mg/dL (1-2 hours)      Critically ill patients:  140 - 180 mg/dL   Lab Results  Component Value Date   GLUCAP 206 (H) 07/01/2022   HGBA1C 10.1 (H) 06/22/2022    Review of Glycemic Control  Latest Reference Range & Units 06/30/22 20:41 07/01/22 07:52 07/01/22 07:57 07/01/22 08:34  Glucose-Capillary 70 - 99 mg/dL 129 (H) 35 (LL) 36 (LL) 194 (H)   Diabetes history: DM 2 Outpatient Diabetes medications:  Ozempic 0.5 mg weekly Current orders for Inpatient glycemic control:  Novolog resistant tid with meals and HS Levemir 10 units q HS  Inpatient Diabetes Program Recommendations:   Please consider reducing Novolog correction to sensitive tid with meals and HS.   Spoke with patient and husband at bedside.  Patient is still slightly confused.  Husband states that patient only checks her blood sugars once a day at home and that they are usually in the 200's.  She is taking Ozempic weekly.  Discussed with she and her husband that she may need insulin at discharge based on A1C.  The plan at this time is for her to go to rehab first.  Explained to husband importance of checking blood sugars at least 3-4 times a day and that goal blood sugars for her are 100-180 mg/dL.  Husband verbalized understanding.  Patient was independent with medications prior to admit.  Explained to husband that there will need to be a plan in place for her DM care once she leaves rehab to go home.  He agrees.  Will follow.   Thanks  Adah Perl, RN, BC-ADM Inpatient Diabetes Coordinator Pager [phone]  (8a-5p)

## 2022-07-01 NOTE — Progress Notes (Signed)
Nutrition Follow-up  DOCUMENTATION CODES:   Non-severe (moderate) malnutrition in context of chronic illness  INTERVENTION:  Provide Magic cup TID with meals, each supplement provides 290 kcal and 9 grams of protein.  Continue multivitamin with minerals po daily.  NUTRITION DIAGNOSIS:   Moderate Malnutrition related to chronic illness (pancreatic adenocarcinoma s/p pancreatectomy) as evidenced by mild fat depletion, moderate muscle depletion.  Ongoing.  GOAL:   Patient will meet greater than or equal to 90% of their needs  Progressing.  MONITOR:   Diet advancement, Labs, Weight trends, TF tolerance, Skin  REASON FOR ASSESSMENT:   Ventilator, Consult Enteral/tube feeding initiation and management  ASSESSMENT:   82 year old female who presented to the ED on 1/02 after being found unresponsive on the toilet. PMH of T2DM, trigeminal neuralgia, previous pancreatic adenocarcinoma s/p pancreatectomy. Pt admitted with DKA, sepsis due to influenza A, AKI.  1/3: intubated 1/10: rectal tube placed 1/11: extubated, TF off 1/12: Cortrak placed and tube feeds resumed 1/15: s/p MBS and diet advanced to dysphagia 1 with honey-thick liquids per SLP; tube feeds stopped and Cortrak tube removed  Met with pt and her husband at bedside. Pt awake and alert at time of RD assessment. She reports her appetite is improving. She cannot recall how much of breakfast tray she had. She had approximately 50% of lunch tray per report and was drinking a honey-thick Mt. Dew at time of RD assessment. No meal documentation available in chart at this time. Denies nausea or abdominal pain. Noted pt still with rectal tube in place. Discussed importance of adequate intake of calories and protein at meals. Pt would benefit from oral nutrition supplement. Pt is amenable to trying Magic Cup with meals.  Pt was 59.6 kg on 06/19/22. It appears admission wt was stated and not measured so unsure of accuracy. Current wt is  58.8 kg. Will continue to monitor trends.  UOP: 1000 mL (0.7 mL/kg/hr)  Rectal tube: 200 mL output  I/O: +870.7 mL since admission  Medications reviewed and include: gabapentin, Novolog 0-5 units QHS, Novolog 0-9 units TID, Levemir 10 units QHS, MVI, Bactrim 1 tablet every 12 hours  Labs reviewed: CBG 35-206, Potassium 2.9, BUN 33   Diet Order:   Diet Order             DIET - DYS 1 Room service appropriate? No; Fluid consistency: Honey Thick  Diet effective now                  EDUCATION NEEDS:   Not appropriate for education at this time  Skin:  Skin Assessment: Skin Integrity Issues: Skin Integrity Issues:: Stage I, Stage II, Stage III Stage I: L wrist Stage II: face Stage III: L arm x 2  Last BM:  06/22/22 - type 7 per rectal tube  Height:   Ht Readings from Last 1 Encounters:  06/17/22 5' (1.524 m)   Weight:   Wt Readings from Last 1 Encounters:  07/01/22 58.8 kg   Ideal Body Weight:  45.5 kg  BMI:  Body mass index is 25.32 kg/m.  Estimated Nutritional Needs:   Kcal:  1610-9604  Protein:  80-95 grams  Fluid:  1.7-1.9 L  Jashley Yellin Magda Paganini, MS, RD, LDN, CNSC Pager number available on Amion

## 2022-07-01 NOTE — Plan of Care (Signed)
  Problem: Safety: Goal: Non-violent Restraint(s) Outcome: Progressing   Problem: Activity: Goal: Ability to tolerate increased activity will improve Outcome: Progressing   Problem: Respiratory: Goal: Ability to maintain a clear airway and adequate ventilation will improve Outcome: Progressing   Problem: Role Relationship: Goal: Method of communication will improve Outcome: Progressing   Problem: Education: Goal: Knowledge of General Education information will improve Description: Including pain rating scale, medication(s)/side effects and non-pharmacologic comfort measures Outcome: Progressing   Problem: Health Behavior/Discharge Planning: Goal: Ability to manage health-related needs will improve Outcome: Progressing   Problem: Clinical Measurements: Goal: Ability to maintain clinical measurements within normal limits will improve Outcome: Progressing Goal: Will remain free from infection Outcome: Progressing Goal: Diagnostic test results will improve Outcome: Progressing Goal: Respiratory complications will improve Outcome: Progressing Goal: Cardiovascular complication will be avoided Outcome: Progressing   Problem: Activity: Goal: Risk for activity intolerance will decrease Outcome: Progressing   Problem: Nutrition: Goal: Adequate nutrition will be maintained Outcome: Progressing   Problem: Coping: Goal: Level of anxiety will decrease Outcome: Progressing   Problem: Elimination: Goal: Will not experience complications related to bowel motility Outcome: Progressing Goal: Will not experience complications related to urinary retention Outcome: Progressing   Problem: Pain Managment: Goal: General experience of comfort will improve Outcome: Progressing   Problem: Safety: Goal: Ability to remain free from injury will improve Outcome: Progressing   Problem: Skin Integrity: Goal: Risk for impaired skin integrity will decrease Outcome: Progressing    Problem: Education: Goal: Ability to describe self-care measures that may prevent or decrease complications (Diabetes Survival Skills Education) will improve Outcome: Progressing Goal: Individualized Educational Video(s) Outcome: Progressing   Problem: Coping: Goal: Ability to adjust to condition or change in health will improve Outcome: Progressing   Problem: Fluid Volume: Goal: Ability to maintain a balanced intake and output will improve Outcome: Progressing   Problem: Health Behavior/Discharge Planning: Goal: Ability to identify and utilize available resources and services will improve Outcome: Progressing Goal: Ability to manage health-related needs will improve Outcome: Progressing   Problem: Metabolic: Goal: Ability to maintain appropriate glucose levels will improve Outcome: Progressing   Problem: Nutritional: Goal: Maintenance of adequate nutrition will improve Outcome: Progressing Goal: Progress toward achieving an optimal weight will improve Outcome: Progressing   Problem: Skin Integrity: Goal: Risk for impaired skin integrity will decrease Outcome: Progressing   Problem: Tissue Perfusion: Goal: Adequacy of tissue perfusion will improve Outcome: Progressing

## 2022-07-02 DIAGNOSIS — J9601 Acute respiratory failure with hypoxia: Secondary | ICD-10-CM | POA: Diagnosis not present

## 2022-07-02 DIAGNOSIS — E162 Hypoglycemia, unspecified: Secondary | ICD-10-CM

## 2022-07-02 DIAGNOSIS — J101 Influenza due to other identified influenza virus with other respiratory manifestations: Secondary | ICD-10-CM | POA: Diagnosis not present

## 2022-07-02 LAB — CBC
HCT: 32.3 % — ABNORMAL LOW (ref 36.0–46.0)
Hemoglobin: 10.4 g/dL — ABNORMAL LOW (ref 12.0–15.0)
MCH: 30.2 pg (ref 26.0–34.0)
MCHC: 32.2 g/dL (ref 30.0–36.0)
MCV: 93.9 fL (ref 80.0–100.0)
Platelets: 834 10*3/uL — ABNORMAL HIGH (ref 150–400)
RBC: 3.44 MIL/uL — ABNORMAL LOW (ref 3.87–5.11)
RDW: 16 % — ABNORMAL HIGH (ref 11.5–15.5)
WBC: 14 10*3/uL — ABNORMAL HIGH (ref 4.0–10.5)
nRBC: 0 % (ref 0.0–0.2)

## 2022-07-02 LAB — BASIC METABOLIC PANEL
Anion gap: 9 (ref 5–15)
BUN: 32 mg/dL — ABNORMAL HIGH (ref 8–23)
CO2: 24 mmol/L (ref 22–32)
Calcium: 8.5 mg/dL — ABNORMAL LOW (ref 8.9–10.3)
Chloride: 106 mmol/L (ref 98–111)
Creatinine, Ser: 0.94 mg/dL (ref 0.44–1.00)
GFR, Estimated: >60 mL/min (ref >60)
Glucose, Bld: 61 mg/dL — ABNORMAL LOW (ref 70–99)
Potassium: 3.9 mmol/L (ref 3.5–5.1)
Sodium: 139 mmol/L (ref 135–145)

## 2022-07-02 LAB — GLUCOSE, CAPILLARY
Glucose-Capillary: 33 mg/dL — CL (ref 70–99)
Glucose-Capillary: 88 mg/dL (ref 70–99)
Glucose-Capillary: 74 mg/dL (ref 70–99)
Glucose-Capillary: 79 mg/dL (ref 70–99)
Glucose-Capillary: 294 mg/dL — ABNORMAL HIGH (ref 70–99)
Glucose-Capillary: 208 mg/dL — ABNORMAL HIGH (ref 70–99)
Glucose-Capillary: 198 mg/dL — ABNORMAL HIGH (ref 70–99)

## 2022-07-02 MED ORDER — PHENOL 1.4 % MT LIQD: 1.0000 | OROMUCOSAL | Status: DC | PRN

## 2022-07-02 NOTE — TOC Progression Note (Addendum)
Transition of Care [facility]) - Progression Note    Patient Details  Name: Nicole Montgomery MRN: [id number] Date of Birth: Sep 02, 1940  [facility] CM/SW [place], [place] Phone Number: 07/02/2022, 2:03 PM  Clinical Narrative:     Update- 4:43pm- CSW received insurance authorization approval for SNF # [id number] approval for PTAR [id number]. CSW informed MD.  CSW spoke with patients spouse and provided SNF bed offers for patient. Patients spouse chose [facility] for SNF placement.Grant with [facility] Confirmed SNF bed for patient. CSW called Tammy with [facility] and started insurance authorization for patient for SNF and PTAR. Insurance authorization for SNF and PTAR currently pending.CSW will continue to follow and assist with patients dc planning needs.   Expected Discharge Plan: [place] Barriers to Discharge: Continued Medical Work up  Expected Discharge Plan and Services In-house Referral: Clinical Social Work     Living arrangements for the past 2 months: Single Family Home                                       Social Determinants of Health (SDOH) Interventions SDOH Screenings   Tobacco Use: Low Risk  (06/27/2022)    Readmission Risk Interventions     No data to display

## 2022-07-02 NOTE — Plan of Care (Signed)
  Problem: Safety: Goal: Non-violent Restraint(s) Outcome: Progressing   Problem: Activity: Goal: Ability to tolerate increased activity will improve Outcome: Progressing   Problem: Respiratory: Goal: Ability to maintain a clear airway and adequate ventilation will improve Outcome: Progressing   Problem: Role Relationship: Goal: Method of communication will improve Outcome: Progressing   Problem: Education: Goal: Knowledge of General Education information will improve Description: Including pain rating scale, medication(s)/side effects and non-pharmacologic comfort measures Outcome: Progressing   Problem: Health Behavior/Discharge Planning: Goal: Ability to manage health-related needs will improve Outcome: Progressing   Problem: Clinical Measurements: Goal: Ability to maintain clinical measurements within normal limits will improve Outcome: Progressing Goal: Will remain free from infection Outcome: Progressing Goal: Diagnostic test results will improve Outcome: Progressing Goal: Respiratory complications will improve Outcome: Progressing Goal: Cardiovascular complication will be avoided Outcome: Progressing   Problem: Activity: Goal: Risk for activity intolerance will decrease Outcome: Progressing   Problem: Nutrition: Goal: Adequate nutrition will be maintained Outcome: Progressing   Problem: Coping: Goal: Level of anxiety will decrease Outcome: Progressing   Problem: Elimination: Goal: Will not experience complications related to bowel motility Outcome: Progressing Goal: Will not experience complications related to urinary retention Outcome: Progressing   Problem: Pain Managment: Goal: General experience of comfort will improve Outcome: Progressing   Problem: Safety: Goal: Ability to remain free from injury will improve Outcome: Progressing   Problem: Skin Integrity: Goal: Risk for impaired skin integrity will decrease Outcome: Progressing    Problem: Education: Goal: Ability to describe self-care measures that may prevent or decrease complications (Diabetes Survival Skills Education) will improve Outcome: Progressing Goal: Individualized Educational Video(s) Outcome: Progressing   Problem: Coping: Goal: Ability to adjust to condition or change in health will improve Outcome: Progressing   Problem: Fluid Volume: Goal: Ability to maintain a balanced intake and output will improve Outcome: Progressing   Problem: Health Behavior/Discharge Planning: Goal: Ability to identify and utilize available resources and services will improve Outcome: Progressing Goal: Ability to manage health-related needs will improve Outcome: Progressing   Problem: Metabolic: Goal: Ability to maintain appropriate glucose levels will improve Outcome: Progressing   Problem: Nutritional: Goal: Maintenance of adequate nutrition will improve Outcome: Progressing Goal: Progress toward achieving an optimal weight will improve Outcome: Progressing   Problem: Skin Integrity: Goal: Risk for impaired skin integrity will decrease Outcome: Progressing   Problem: Tissue Perfusion: Goal: Adequacy of tissue perfusion will improve Outcome: Progressing

## 2022-07-02 NOTE — Progress Notes (Signed)
Occupational Therapy Treatment Patient Details Name: Nicole Montgomery MRN: [id number] DOB: 1940-12-10 Today's Date: 07/02/2022   History of present illness 82 yo female admitted 1/2 with AMS, DKA, flu (+). Intubated 1/3-1/11. Per chart fall 1/15. PMhx: T2DM, trigeminal neuralgia, HLD, HTN   OT comments  Pt in bathroom with mobility tech upon entry.  Pt completing toileting with mod assist, transfer from commode with mod assist, and functional mobility back to recliner using RW with min -min guard assist.  Pt fatigues easily, cueing for PLB; reports dizziness after approx 5 ft.  Vitals assessed when sitting and BP stable, HR up to 130s, and Spo2 maintained with supplemental O2.  Noted SpO2 from handoff on 8L, wall on 3L; when returned to wall supply, SPO2 desaturated to 88% with LB dressing but recovered quickly with upright position and cueing for PLB.  Pt remains disoriented, but able to follow simple commands with increased time, decreased awareness and problem solving.  Continue to recommend SNF rehab at this time. Will follow acutely.    Recommendations for follow up therapy are one component of a multi-disciplinary discharge planning process, led by the attending physician.  Recommendations may be updated based on patient status, additional functional criteria and insurance authorization.    Follow Up Recommendations  Skilled nursing-short term rehab (<3 hours/day)     Assistance Recommended at Discharge Frequent or constant Supervision/Assistance  Patient can return home with the following  Assistance with cooking/housework;Direct supervision/assist for medications management;Direct supervision/assist for financial management;Assist for transportation;Help with stairs or ramp for entrance;A lot of help with walking and/or transfers;A lot of help with bathing/dressing/bathroom   Equipment Recommendations  BSC/3in1;Other (comment) (RW)    Recommendations for Other Services      Precautions  / Restrictions Precautions Precautions: Fall Precaution Comments: watch O2 and HR Restrictions Weight Bearing Restrictions: No       Mobility Bed Mobility               General bed mobility comments: OOB on commode upon entry    Transfers Overall transfer level: Needs assistance Equipment used: Rolling walker (2 wheels) Transfers: Sit to/from Stand Sit to Stand: Mod assist           General transfer comment: pt in restroom with mobility tech upon entry, she requires mod assist to power up from commode givne cueing for hand placement     Balance Overall balance assessment: Needs assistance Sitting-balance support: No upper extremity supported, Feet supported Sitting balance-Leahy Scale: Good     Standing balance support: Bilateral upper extremity supported, During functional activity Standing balance-Leahy Scale: Fair Standing balance comment: relies on BUE support                           ADL either performed or assessed with clinical judgement   ADL Overall ADL's : Needs assistance/impaired Eating/Feeding: Set up;Supervision/ safety;Sitting   Grooming: Set up;Sitting               Lower Body Dressing: Moderate assistance;Sit to/from stand Lower Body Dressing Details (indicate cue type and reason): able to manage socks in sitting but O2 saturations decreased to 88%, standing with min assist but relies on BUe support Toilet Transfer: Moderate assistance;Rolling walker (2 wheels) Toilet Transfer Details (indicate cue type and reason): to power up from commode, cueing for hand placement Toileting- Clothing Manipulation and Hygiene: Moderate assistance;Sit to/from stand;Sitting/lateral lean Toileting - Clothing Manipulation Details (indicate cue type and reason):  able to complete hygiene in sitting but relies on BUE support standing     Functional mobility during ADLs: Moderate assistance;Minimal assistance;Rolling walker (2 wheels);Cueing for  safety      Extremity/Trunk Assessment              Vision       Perception     Praxis      Cognition Arousal/Alertness: Awake/alert Behavior During Therapy: Flat affect Overall Cognitive Status: Impaired/Different from baseline Area of Impairment: Orientation, Attention, Safety/judgement, Awareness, Memory, Following commands, Problem solving                 Orientation Level: Disoriented to, Situation, Time, Place Current Attention Level: Sustained Memory: Decreased short-term memory, Decreased recall of precautions Following Commands: Follows one step commands consistently, Follows one step commands with increased time, Follows multi-step commands inconsistently Safety/Judgement: Decreased awareness of safety, Decreased awareness of deficits Awareness: Intellectual Problem Solving: Slow processing, Decreased initiation, Difficulty sequencing, Requires verbal cues, Requires tactile cues General Comments: pt reports being at [facility] in [place], march 2024.  She follows simple commands with increased time but demonstrating decreased safety awareness, problem solving.        Exercises      Shoulder Instructions       General Comments Pt on 8L HFNC upon entry with mobility tech, SPO2 maintained.  Pt reports feeling dizzy during functional mobility back to recliner. Once sitting BP assessed WFL, HR up to 130s.  Replaced O2 on wall (was on 3L) and SPO2 maintained 90%.  Desaturated with LB dressing to 88% but recovered with PLB.    Pertinent Vitals/ Pain       Pain Assessment Pain Assessment: Faces Faces Pain Scale: No hurt Pain Intervention(s): Monitored during session  Home Living                                          Prior Functioning/Environment              Frequency  Min 2X/week        Progress Toward Goals  OT Goals(current goals can now be found in the care plan section)  Progress towards OT goals: Progressing  toward goals  Acute Rehab OT Goals Patient Stated Goal: home OT Goal Formulation: With patient Time For Goal Achievement: 07/12/22 Potential to Achieve Goals: Good  Plan Discharge plan remains appropriate;Frequency remains appropriate    Co-evaluation                 AM-PAC OT "6 Clicks" Daily Activity     Outcome Measure   Help from another person eating meals?: A Little Help from another person taking care of personal grooming?: A Little Help from another person toileting, which includes using toliet, bedpan, or urinal?: A Lot Help from another person bathing (including washing, rinsing, drying)?: A Lot Help from another person to put on and taking off regular upper body clothing?: A Little Help from another person to put on and taking off regular lower body clothing?: A Lot 6 Click Score: 15    End of Session Equipment Utilized During Treatment: Oxygen;Rolling walker (2 wheels)  OT Visit Diagnosis: Other abnormalities of gait and mobility (R26.89);Muscle weakness (generalized) (M62.81);Other symptoms and signs involving cognitive function   Activity Tolerance Patient tolerated treatment well   Patient Left in chair;with call bell/phone within reach;with chair alarm set;with nursing/sitter in  room   Nurse Communication Mobility status;Precautions        Time: 5391-2258 OT Time Calculation (min): 24 min  Charges: OT General Charges $OT Visit: 1 Visit OT Treatments $Self Care/Home Management : 23-37 mins  [place] Office [phone]   Delight Stare 07/02/2022, 10:41 AM

## 2022-07-02 NOTE — Progress Notes (Signed)
[facility] PROGRESS NOTE   Nicole Montgomery DXA:[id number] DOB: 1941/04/08 DOA: 06/17/2022  PCP: Marco Collie, MD  Brief History/Interval Summary: 82 y.o. F with PMH significant for Type 2 DM on Ozempic, previous pancreatic adenocarcinoma, status post partial pancreatectomy, trigeminal Neuralgia, HL who follows with PCP in [place] who presents with altered mental status.  Her husband is at the bedside and provides history-states that she began showing signs of confusion approximately one week ago.  She has had reduced appetite and barely been eating or drinking.  Her confusion worsened to the point she was not responsive on the toilet and so he called EMS who noted glucose >600.   He notes they cared for grandchildren last week who had the flu.  She also has been coughing and short of breath without fever, though had a fall from standing on 12/31.  She did not lose consciousness at that time and did not want to come to the hospital.    In the ED, she was hypertensive, tachycardic, hypoxic requiring Collyer and agitated.  CTH was completed without acute findings, labs significant for glucose >1200, bicarb 12, gap 20, beta hydroxy 1.6, WBC 11.3, pH 7.9 and flu A+ with R-sided opacities on CXR.  Her husband states she may have missed her last ozempic injection secondary to her confusion.  PCCM consulted for admission given her AMS   Significant Events 1/2 presented with AMS, flu + and in DKA with significant agitation requiring precedex.  Started on antibiotic.  1/3 intubated for sedation needs and oxygen requirement. 1/4 transition from insulin drip to subQ  1/6 restarted on insulin overnight for hyperglycemia but anion gap remains closed.  Mild vent dyssynchrony with inability to tolerate SBT 1/7 no acute events overnight, glucose remains elevated will need up titration of insulin management.  Tolerating SBT trial this a.m. diuresed almost 5 L with use of diuretics 1/8 start Zosyn  1/10 more awake  today, off propofol  1/11 QTC 511 overnight, was extubated today 1/12 awake, able to follow commands and move extremities, stopped Precedex, cortrak placed, tube feeds  1/13 on Roundup at 4L 1/16 Mentation improving, still delirium. Still hypoxic with leukocytosis, starting bactrim per respiratory culture results. Pursuing SNF.     Subjective/Interval History: Overall patient feels better compared to the last few days.  Occasional cough.  Denies any shortness of breath.  No nausea or vomiting.  Appetite is poor.    Assessment/Plan:  Type 2 Diabetes  DKA  Pancreatic adenocarcinoma s/p partial pancreatectomy (2012) A1c 10.1%. PTA listed as taking Ozempic. Continues to have hypoglycemic episodes.  Will stop the bedtime coverage of SSI.  Will discontinue Levemir for now.  Monitor CBGs closely.   Acute hypoxic respiratory failure secondary to Influenza A with superimposed bacterial pneumonia and volume overload Chronic hypercarbic respiratory failure CXR 1/12 with patchy infiltrates in R and L lung base 1/7 Trach culture: G - rods and G + cocci > few staph epidermidis and candida albicans.  - Completed zosyn 5 days. Given note of silent aspiration on MBS, suspect aspiration pneumonitis is causing persistent leukocytosis and hypoxia. Will monitor on modified diet.  - Noted persistent leukocytosis (may be pneumonitis) with respiratory symptoms. S. epi had some resistance.  Patient was to read on Bactrim.  Monitor renal function closely.  Respiratory status is stable. Oxygen being weaned.  Currently on nasal cannula at 1 L/min.   Hypokalemia Supplemented    Dysphagia  Moderate protein calorie malnutrition Patient underwent modified barium  swallow.  Currently on dysphagia 1 diet.  Encourage oral intake.  Considering hypoglycemic episodes.   Acute encephalopathy 1/4 MRI brain unremarkable. - Continue delirium precautions   Trigeminal neuralgia: - Continue home amitriptyline, duloxetine,  gabapentin 300 mg BID   Prolonged QTc Repeat EKG showed QT 494.  Caution with QT prolonging meds   AKI Appears to have resolved.  Avoid nephrotoxic agents.     Normocytic anemia Stable.  No overt bleeding noted.   Reactive thrombocytosis Could be reflective of acute phase reactant   Unwitnessed fall 1/15 Suspect low energy mechanism with no apparent injury.   Moderate protein calorie malnutrition Nutrition Problem: Moderate Malnutrition Etiology: chronic illness (pancreatic adenocarcinoma s/p pancreatectomy)   DVT Prophylaxis: Subcutaneous heparin Code Status: Full code Family Communication: No family at bedside Disposition Plan: SNF when glucose levels have stabilized  Status is: Inpatient Remains inpatient appropriate because: Hypoglycemia, acute respiratory failure with hypoxia    Medications: Scheduled:  amitriptyline  50 mg Oral QHS   DULoxetine  60 mg Oral Daily   gabapentin  300 mg Oral Q12H   heparin  5,000 Units Subcutaneous Q8H   insulin aspart  0-9 Units Subcutaneous TID WC   ipratropium-albuterol  3 mL Nebulization BID   multivitamin with minerals  1 tablet Oral Daily   sulfamethoxazole-trimethoprim  1 tablet Oral Q12H   Continuous:  sodium chloride 10 mL/hr at 06/25/22 2142   LOV:FIEPPI chloride, acetaminophen, albuterol, labetalol, mouth rinse, white petrolatum  Antibiotics: Anti-infectives (From admission, onward)    Start     Dose/Rate Route Frequency Ordered Stop   07/01/22 1000  sulfamethoxazole-trimethoprim (BACTRIM DS) 800-160 MG per tablet 1 tablet        1 tablet Oral Every 12 hours 07/01/22 0633 07/06/22 0959   06/26/22 1400  piperacillin-tazobactam (ZOSYN) IVPB 3.375 g        3.375 g 12.5 mL/hr over 240 Minutes Intravenous Every 8 hours 06/26/22 0847 06/28/22 0210   06/24/22 1400  piperacillin-tazobactam (ZOSYN) IVPB 2.25 g  Status:  Discontinued        2.25 g 100 mL/hr over 30 Minutes Intravenous Every 8 hours 06/24/22 0952 06/26/22  0847   06/23/22 1115  piperacillin-tazobactam (ZOSYN) IVPB 3.375 g  Status:  Discontinued        3.375 g 12.5 mL/hr over 240 Minutes Intravenous Every 8 hours 06/23/22 1026 06/24/22 0952   06/22/22 1000  oseltamivir (TAMIFLU) capsule 30 mg        30 mg Per Tube Daily 06/21/22 1310 06/23/22 0941   06/19/22 2215  cefTRIAXone (ROCEPHIN) 2 g in sodium chloride 0.9 % 100 mL IVPB  Status:  Discontinued        2 g 200 mL/hr over 30 Minutes Intravenous Every 24 hours 06/19/22 1351 06/23/22 1023   06/19/22 1030  oseltamivir (TAMIFLU) capsule 30 mg        30 mg Per Tube Daily 06/19/22 0936 06/21/22 0931   06/18/22 2215  cefTRIAXone (ROCEPHIN) 1 g in sodium chloride 0.9 % 100 mL IVPB  Status:  Discontinued        1 g 200 mL/hr over 30 Minutes Intravenous Every 24 hours 06/17/22 2312 06/19/22 1351   06/18/22 2200  azithromycin (ZITHROMAX) 500 mg in sodium chloride 0.9 % 250 mL IVPB  Status:  Discontinued        500 mg 250 mL/hr over 60 Minutes Intravenous Every 24 hours 06/17/22 2312 06/23/22 1023   06/18/22 1000  oseltamivir (TAMIFLU) capsule 75 mg  Status:  Discontinued        75 mg Oral Daily 06/17/22 2244 06/17/22 2249   06/18/22 0000  oseltamivir (TAMIFLU) capsule 30 mg  Status:  Discontinued        30 mg Oral Daily 06/17/22 2249 06/19/22 0936   06/17/22 2130  cefTRIAXone (ROCEPHIN) 1 g in sodium chloride 0.9 % 100 mL IVPB        1 g 200 mL/hr over 30 Minutes Intravenous  Once 06/17/22 2121 06/17/22 2233   06/17/22 2130  azithromycin (ZITHROMAX) 500 mg in sodium chloride 0.9 % 250 mL IVPB        500 mg 250 mL/hr over 60 Minutes Intravenous  Once 06/17/22 2121 06/17/22 2327       Objective:  Vital Signs  Vitals:   07/01/22 1545 07/01/22 1603 07/01/22 1943 07/02/22 0405  BP: (!) 148/83  (!) 141/73 118/63  Pulse: 92  93 99  Resp: '18 18 16 16  '$ Temp: 98.4 F (36.9 C)  98.4 F (36.9 C) 98.4 F (36.9 C)  TempSrc: Axillary  Oral Oral  SpO2: 94%  93% 93%  Weight:    57.4 kg  Height:        No intake or output data in the 24 hours ending 07/02/22 1013 Filed Weights   06/30/22 0500 07/01/22 0500 07/02/22 0405  Weight: 55 kg 58.8 kg 57.4 kg    General appearance: Awake alert.  In no distress.  Mildly distracted Resp: Coarse breath sounds with doctorly with few crackles at the bases.  No wheezing or rhonchi appreciated today. Cardio: S1-S2 is normal regular.  No S3-S4.  No rubs murmurs or bruit GI: Abdomen is soft.  Nontender nondistended.  Bowel sounds are present normal.  No masses organomegaly   Lab Results:  Data Reviewed: I have personally reviewed following labs and reports of the imaging studies  CBC: Recent Labs  Lab 06/26/22 0124 06/27/22 0711 06/28/22 0053 06/29/22 0203 06/30/22 0225 07/01/22 0250 07/02/22 0300  WBC 19.1*   < > 18.7* 15.8* 15.0* 15.6* 14.0*  NEUTROABS 16.6*  --   --   --   --   --   --   HGB 10.0*   < > 10.8* 10.4* 10.3* 10.5* 10.4*  HCT 32.0*   < > 33.2* 32.5* 32.0* 32.7* 32.3*  MCV 96.4   < > 93.3 95.0 95.2 95.1 93.9  PLT PLATELET CLUMPS NOTED ON SMEAR, COUNT APPEARS INCREASED   < > 645* 730* 800* 835* 834*   < > = values in this interval not displayed.    Basic Metabolic Panel: Recent Labs  Lab 06/27/22 0711 06/28/22 0053 06/28/22 0730 06/28/22 1658 06/29/22 0203 06/30/22 0225 07/01/22 0250 07/02/22 0300  NA 145   < >  --  140 144 145 143 139  K 3.8   < >  --  4.0 3.8 3.6 2.9* 3.9  CL 106   < >  --  103 107 109 107 106  CO2 29   < >  --  '27 28 28 28 24  '$ GLUCOSE 173*   < >  --  308* 260* 211* 53* 61*  BUN 35*   < >  --  41* 39* 38* 33* 32*  CREATININE 1.24*   < >  --  1.15* 1.04* 0.96 0.81 0.94  CALCIUM 8.2*   < >  --  8.3* 8.3* 8.7* 8.9 8.5*  MG 1.9  --  1.9  --   --   --   --   --    < > =  values in this interval not displayed.    GFR: Estimated Creatinine Clearance: 37.3 mL/min (by C-G formula based on SCr of 0.94 mg/dL).  Liver Function Tests: Recent Labs  Lab 06/26/22 0124  AST 25  ALT 19  ALKPHOS  189*  BILITOT 0.5  PROT 5.8*  ALBUMIN <1.5*     CBG: Recent Labs  Lab 07/01/22 2332 07/02/22 0357 07/02/22 0409 07/02/22 0626 07/02/22 0823  GLUCAP 164* 33* 88 74 79     Radiology Studies: DG Swallowing Func-Speech Pathology  Result Date: 06/30/2022 Table formatting from the original result was not included. Objective Swallowing Evaluation: Type of Study: MBS-Modified Barium Swallow Study  Patient Details Name: Nicole Montgomery MRN: [id number] Date of Birth: 09-05-1940 Today's Date: 06/30/2022 Time: SLP Start Time (ACUTE ONLY): 1962 -SLP Stop Time (ACUTE ONLY): 2297 SLP Time Calculation (min) (ACUTE ONLY): 17 min Past Medical History: Past Medical History: Diagnosis Date  DM (diabetes mellitus) ([place])   s/p pancreatectomy  H/O splenectomy 2012  HTN (hypertension)   Nephrolithiasis   Pancreatic cancer ([place]) 2012  Sarcoid 2012 Past Surgical History: Past Surgical History: Procedure Laterality Date  ABDOMINAL HYSTERECTOMY    APPENDECTOMY    CHOLECYSTECTOMY    PANCREATECTOMY  2012  PARTIAL COLECTOMY  2012  SPLENECTOMY  2012 HPI: Pt is a 82 y.o. female who presents with altered mental status, flu + and in DKA. Her husband is at the bedside and provides history-states that she began showing signs of confusion approximately one week ago.  She has had reduced appetite and barely been eating or drinking. ETT 1/3- 1/11 for sedation and O2 needs. CXR (06/22/22) revealed "Decreasing bilateral airspace disease". PMH: Type 2 DM on Ozempic, previous pancreatic adenocarcinoma, status post pancreatectomy, trigeminal Neuralgia, HL who follows with PCP in [place].  No data recorded  Recommendations for follow up therapy are one component of a multi-disciplinary discharge planning process, led by the attending physician.  Recommendations may be updated based on patient status, additional functional criteria and insurance authorization. Assessment / Plan / Recommendation   06/30/2022  12:23 PM Clinical Impressions Clinical  Impression Pt exhibits a moderate oropharyngeal dysphagia marked by silent aspiration with thin liquid. Oral phase marked by delayed propulsion needing intermittent cues to "swallow".  There was delayed swallow initation, reduced tongue base retraction, decreased laryngeal elevation and closure. A chin tuck posture resulted in increased aspiration with thin and initially small sips nectar did not enter airway but at end of study she could not take small sips when cues and larger sips nectar were consistently aspirated. Honey thick did not enter airway and did not leave increase pharyngeal retention. There was max residue with regular texture, prolonged mastication and obvious fatigue. Indications of reduced pharyngoesophageal segment with residue below the UES. Esophageal scan revealed stasis and what appeared to be possibly slower contractions. Recommend puree (Dys 1) and honey thick, meds crushed. SLP can work with pt on smaller sips to see if she can upgrade to nectar if consistently taking smaller sips and upgrade from puree. SLP Visit Diagnosis Dysphagia, oropharyngeal phase (R13.12) Impact on safety and function Moderate aspiration risk     06/30/2022  12:23 PM Treatment Recommendations Treatment Recommendations Therapy as outlined in treatment plan below     06/30/2022  12:23 PM Prognosis Prognosis for Safe Diet Advancement Good   06/30/2022  12:23 PM Diet Recommendations SLP Diet Recommendations Dysphagia 1 (Puree) solids;Honey thick liquids Liquid Administration via Cup;Straw Medication Administration Crushed with puree Compensations Minimize environmental distractions;Slow rate;Small  sips/bites Postural Changes Seated upright at 90 degrees     06/30/2022  12:23 PM Other Recommendations Oral Care Recommendations Oral care BID Follow Up Recommendations Skilled nursing-short term rehab (<3 hours/day) Functional Status Assessment Patient has had a recent decline in their functional status and demonstrates the  ability to make significant improvements in function in a reasonable and predictable amount of time.   06/30/2022  12:23 PM Frequency and Duration  Speech Therapy Frequency (ACUTE ONLY) min 2x/week Treatment Duration 2 weeks     06/30/2022  12:23 PM Oral Phase Oral Phase Impaired Oral - Honey Cup Delayed oral transit;Weak lingual manipulation Oral - Nectar Teaspoon Delayed oral transit;Weak lingual manipulation Oral - Nectar Cup Delayed oral transit;Weak lingual manipulation Oral - Thin Teaspoon Delayed oral transit;Weak lingual manipulation Oral - Thin Cup Delayed oral transit;Weak lingual manipulation Oral - Puree Delayed oral transit;Weak lingual manipulation Oral - Regular Delayed oral transit;Weak lingual manipulation    06/30/2022  12:23 PM Pharyngeal Phase Pharyngeal Phase Impaired Pharyngeal- Honey Cup Pharyngeal residue - valleculae;Reduced tongue base retraction;Reduced epiglottic inversion Pharyngeal- Nectar Teaspoon Penetration/Aspiration during swallow Pharyngeal Material does not enter airway Pharyngeal- Nectar Cup Delayed swallow initiation-pyriform sinuses;Pharyngeal residue - valleculae;Pharyngeal residue - pyriform;Penetration/Aspiration during swallow;Reduced airway/laryngeal closure;Reduced laryngeal elevation;Reduced epiglottic inversion Pharyngeal Material enters airway, passes BELOW cords without attempt by patient to eject out (silent aspiration) Pharyngeal- Thin Teaspoon Penetration/Aspiration during swallow;Reduced laryngeal elevation;Reduced airway/laryngeal closure;Reduced tongue base retraction;Reduced epiglottic inversion;Reduced pharyngeal peristalsis;Delayed swallow initiation-pyriform sinuses Pharyngeal Material enters airway, passes BELOW cords without attempt by patient to eject out (silent aspiration) Pharyngeal- Thin Cup Penetration/Aspiration during swallow;Reduced pharyngeal peristalsis;Reduced epiglottic inversion;Reduced anterior laryngeal mobility;Reduced laryngeal  elevation;Reduced airway/laryngeal closure;Reduced tongue base retraction Pharyngeal Material enters airway, passes BELOW cords without attempt by patient to eject out (silent aspiration) Pharyngeal- Puree Pharyngeal residue - valleculae;Reduced tongue base retraction Pharyngeal- Regular Pharyngeal residue - valleculae;Reduced pharyngeal peristalsis;Reduced tongue base retraction    06/30/2022  12:23 PM Cervical Esophageal Phase  Cervical Esophageal Phase Impaired [place] Siren 06/30/2022, 2:05 PM                         LOS: 15 days   Freeland Pracht [employer] on [email or web address]  07/02/2022, 10:13 AM

## 2022-07-02 NOTE — Progress Notes (Signed)
Mobility Specialist - Progress Note   07/02/22 1239  Mobility  Activity Transferred to/from [facility]  Level of Assistance Minimal assist, patient does 75% or more  Assistive Device Front wheel walker  Activity Response Tolerated well  Mobility Referral Yes  $Mobility charge 1 Mobility   Pt received on BSC needing assistance back to chair. No complaints throughout transfer.Pt was left in chair with all needs met and chair alarm on.   Franki Monte  Mobility Specialist Please contact via Solicitor or Rehab office at [phone]

## 2022-07-02 NOTE — Progress Notes (Signed)
Inpatient Diabetes Program Recommendations  AACE/ADA: New Consensus Statement on Inpatient Glycemic Control (2015)  Target Ranges:  Prepandial:   less than 140 mg/dL      Peak postprandial:   less than 180 mg/dL (1-2 hours)      Critically ill patients:  140 - 180 mg/dL   Lab Results  Component Value Date   GLUCAP 79 07/02/2022   HGBA1C 10.1 (H) 06/22/2022    Review of Glycemic Control  Latest Reference Range & Units 07/02/22 03:57 07/02/22 04:09 07/02/22 06:26 07/02/22 08:23  Glucose-Capillary 70 - 99 mg/dL 33 (LL) 88 74 79   Diabetes history: Type 2 DM Outpatient Diabetes medications:  Ozempic 0.5 mg weekly Current orders for Inpatient glycemic control:  Novolog 0-9 units tid with meals and HS Levemir 10 units q HS  Inpatient Diabetes Program Recommendations:    Note low blood sugars.  Novolog correction reduced to sensitive yesterday.  Consider d/c of Levemir.   Thanks,  Adah Perl, RN, BC-ADM Inpatient Diabetes Coordinator Pager [phone]  (8a-5p)

## 2022-07-02 NOTE — Progress Notes (Signed)
Mobility Specialist - Progress Note   07/02/22 1007  Mobility  Activity Ambulated with assistance in room  Level of Assistance Minimal assist, patient does 75% or more  Assistive Device Front wheel walker  Distance Ambulated (ft) 20 ft  Activity Response Tolerated well  Mobility Referral Yes  $Mobility charge 1 Mobility    Pre-mobility: 93% SpO2 During mobility: 94% SpO2 Post-mobility: 95% SpO2  Pt was received in bed and agreeable to mobility. Pt required MinA throughout session. Upon end of session pt requested to use BR. Pt with successful void. Pt was left on toilet with all needs met and OT present.   Nicole Montgomery  Mobility Specialist Please contact via Solicitor or Rehab office at [phone]

## 2022-07-03 DIAGNOSIS — N179 Acute kidney failure, unspecified: Secondary | ICD-10-CM | POA: Diagnosis not present

## 2022-07-03 DIAGNOSIS — J101 Influenza due to other identified influenza virus with other respiratory manifestations: Secondary | ICD-10-CM | POA: Diagnosis not present

## 2022-07-03 DIAGNOSIS — E162 Hypoglycemia, unspecified: Secondary | ICD-10-CM | POA: Diagnosis not present

## 2022-07-03 DIAGNOSIS — J9601 Acute respiratory failure with hypoxia: Secondary | ICD-10-CM | POA: Diagnosis not present

## 2022-07-03 LAB — BASIC METABOLIC PANEL
Anion gap: 10 (ref 5–15)
BUN: 30 mg/dL — ABNORMAL HIGH (ref 8–23)
CO2: 21 mmol/L — ABNORMAL LOW (ref 22–32)
Calcium: 8.6 mg/dL — ABNORMAL LOW (ref 8.9–10.3)
Chloride: 105 mmol/L (ref 98–111)
Creatinine, Ser: 1.31 mg/dL — ABNORMAL HIGH (ref 0.44–1.00)
GFR, Estimated: 41 mL/min — ABNORMAL LOW (ref >60)
Glucose, Bld: 262 mg/dL — ABNORMAL HIGH (ref 70–99)
Potassium: 5.7 mmol/L — ABNORMAL HIGH (ref 3.5–5.1)
Sodium: 136 mmol/L (ref 135–145)

## 2022-07-03 LAB — CBC
HCT: 38.5 % (ref 36.0–46.0)
Hemoglobin: 12 g/dL (ref 12.0–15.0)
MCH: 30.1 pg (ref 26.0–34.0)
MCHC: 31.2 g/dL (ref 30.0–36.0)
MCV: 96.5 fL (ref 80.0–100.0)
Platelets: 475 10*3/uL — ABNORMAL HIGH (ref 150–400)
RBC: 3.99 MIL/uL (ref 3.87–5.11)
RDW: 16.1 % — ABNORMAL HIGH (ref 11.5–15.5)
WBC: 12.8 10*3/uL — ABNORMAL HIGH (ref 4.0–10.5)
nRBC: 0.2 % (ref 0.0–0.2)

## 2022-07-03 LAB — GLUCOSE, CAPILLARY
Glucose-Capillary: 232 mg/dL — ABNORMAL HIGH (ref 70–99)
Glucose-Capillary: 245 mg/dL — ABNORMAL HIGH (ref 70–99)
Glucose-Capillary: 262 mg/dL — ABNORMAL HIGH (ref 70–99)
Glucose-Capillary: 219 mg/dL — ABNORMAL HIGH (ref 70–99)

## 2022-07-03 MED ORDER — SODIUM ZIRCONIUM CYCLOSILICATE 10 G PO PACK
10.0000 g | PACK | Freq: Three times a day (TID) | ORAL | Status: AC
  Administered 2022-07-03 (×2): 10 g via ORAL
  Filled 2022-07-03 (×2): qty 1

## 2022-07-03 MED ORDER — SODIUM CHLORIDE 0.45 % IV SOLN: INTRAVENOUS | Status: AC

## 2022-07-03 MED ORDER — INSULIN DETEMIR 100 UNIT/ML ~~LOC~~ SOLN
5.0000 [IU] | Freq: Every day | SUBCUTANEOUS | Status: DC
  Administered 2022-07-03 – 2022-07-04 (×2): 5 [IU] via SUBCUTANEOUS
  Filled 2022-07-03 (×3): qty 0.05

## 2022-07-03 NOTE — Progress Notes (Signed)
[facility] PROGRESS NOTE   Nicole Montgomery ZOX:[id number] DOB: [birth year] DOA: 06/17/2022  PCP: Marco Collie, MD  Brief History/Interval Summary: 82 y.o. F with PMH significant for Type 2 DM on Ozempic, previous pancreatic adenocarcinoma, status post partial pancreatectomy, trigeminal Neuralgia, HL who follows with PCP in [place] who presents with altered mental status.  Her husband is at the bedside and provides history-states that she began showing signs of confusion approximately one week ago.  She has had reduced appetite and barely been eating or drinking.  Her confusion worsened to the point she was not responsive on the toilet and so he called EMS who noted glucose >600.   He notes they cared for grandchildren last week who had the flu.  She also has been coughing and short of breath without fever, though had a fall from standing on 12/31.  She did not lose consciousness at that time and did not want to come to the hospital.    In the ED, she was hypertensive, tachycardic, hypoxic requiring [place] and agitated.  CTH was completed without acute findings, labs significant for glucose >1200, bicarb 12, gap 20, beta hydroxy 1.6, WBC 11.3, pH 7.9 and flu A+ with R-sided opacities on CXR.  Her husband states she may have missed her last ozempic injection secondary to her confusion.  PCCM consulted for admission given her AMS   Significant Events 1/2 presented with AMS, flu + and in DKA with significant agitation requiring precedex.  Started on antibiotic.  1/3 intubated for sedation needs and oxygen requirement. 1/4 transition from insulin drip to subQ  1/6 restarted on insulin overnight for hyperglycemia but anion gap remains closed.  Mild vent dyssynchrony with inability to tolerate SBT 1/7 no acute events overnight, glucose remains elevated will need up titration of insulin management.  Tolerating SBT trial this a.m. diuresed almost 5 L with use of diuretics 1/8 start Zosyn  1/10 more awake  today, off propofol  1/11 QTC 511 overnight, was extubated today 1/12 awake, able to follow commands and move extremities, stopped Precedex, cortrak placed, tube feeds  1/13 on  at 4L 1/16 Mentation improving, still delirium. Still hypoxic with leukocytosis, starting bactrim per respiratory culture results. Pursuing SNF.     Subjective/Interval History: Patient mildly distracted this morning.  Denies any complaints.  Slept well overnight.     Assessment/Plan:  Type 2 Diabetes  DKA  Pancreatic adenocarcinoma s/p partial pancreatectomy (2012) A1c 10.1%. PTA listed as taking Ozempic. Glucose levels have improved.  No hypoglycemic episodes noted in the last 24 hours.  CBGs actually are running elevated.  Will place her back on a very low-dose of Levemir. Continue SSI.   Acute hypoxic respiratory failure secondary to Influenza A with superimposed bacterial pneumonia and volume overload Chronic hypercarbic respiratory failure CXR 1/12 with patchy infiltrates in R and L lung base 1/7 Trach culture: G - rods and G + cocci > few staph epidermidis and candida albicans.  - Completed zosyn 5 days. Given note of silent aspiration on MBS, suspect aspiration pneumonitis is causing persistent leukocytosis and hypoxia. Will monitor on modified diet.  Due to persistent leukocytosis and evidence for this Staph epidermidis in the sputum patient was placed on Bactrim.  However however creatinine and potassium level noted to be quite high today.  Will discontinue Bactrim since she otherwise seems to be doing well.  WBC is improving.  Will hold off on further antibiotics for now.  She is afebrile.  Oxygen requirements have  improved as well.  She is currently requiring only 1 L of oxygen nasal cannula.  Acute kidney injury/hyperkalemia Possibly due to Bactrim.  Will hold Bactrim.  Give IV fluids.  Give Lokelma.  Recheck labs tomorrow.   Dysphagia  Moderate protein calorie malnutrition Patient underwent  modified barium swallow.  Currently on dysphagia 1 diet.  Encourage oral intake.     Acute encephalopathy 1/4 MRI brain unremarkable. - Continue delirium precautions   Trigeminal neuralgia: Continue home amitriptyline, duloxetine, gabapentin 300 mg BID   Prolonged QTc Repeat EKG showed QT 494.  Caution with QT prolonging meds  Normocytic anemia Stable.  No overt bleeding noted.   Reactive thrombocytosis Could be reflective of acute phase reactant.  Seems to be better.   Unwitnessed fall 1/15 Suspect low energy mechanism with no apparent injury.   Moderate protein calorie malnutrition Nutrition Problem: Moderate Malnutrition Etiology: chronic illness (pancreatic adenocarcinoma s/p pancreatectomy)   DVT Prophylaxis: Subcutaneous heparin Code Status: Full code Family Communication: No family at bedside Disposition Plan: SNF when medically stable.  Hopefully by 1/19 if electrolytes and renal function improved.  Status is: Inpatient Remains inpatient appropriate because: Hypoglycemia, acute respiratory failure with hypoxia    Medications: Scheduled:  amitriptyline  50 mg Oral QHS   DULoxetine  60 mg Oral Daily   gabapentin  300 mg Oral Q12H   heparin  5,000 Units Subcutaneous Q8H   insulin aspart  0-9 Units Subcutaneous TID WC   multivitamin with minerals  1 tablet Oral Daily   sodium zirconium cyclosilicate  10 g Oral TID   Continuous:  sodium chloride 75 mL/hr at 07/03/22 0832   sodium chloride 10 mL/hr at 06/25/22 2142   WRU:EAVWUJ chloride, acetaminophen, albuterol, labetalol, mouth rinse, phenol, white petrolatum  Antibiotics: Anti-infectives (From admission, onward)    Start     Dose/Rate Route Frequency Ordered Stop   07/01/22 1000  sulfamethoxazole-trimethoprim (BACTRIM DS) 800-160 MG per tablet 1 tablet  Status:  Discontinued        1 tablet Oral Every 12 hours 07/01/22 0633 07/03/22 0703   06/26/22 1400  piperacillin-tazobactam (ZOSYN) IVPB 3.375 g         3.375 g 12.5 mL/hr over 240 Minutes Intravenous Every 8 hours 06/26/22 0847 06/28/22 0210   06/24/22 1400  piperacillin-tazobactam (ZOSYN) IVPB 2.25 g  Status:  Discontinued        2.25 g 100 mL/hr over 30 Minutes Intravenous Every 8 hours 06/24/22 0952 06/26/22 0847   06/23/22 1115  piperacillin-tazobactam (ZOSYN) IVPB 3.375 g  Status:  Discontinued        3.375 g 12.5 mL/hr over 240 Minutes Intravenous Every 8 hours 06/23/22 1026 06/24/22 0952   06/22/22 1000  oseltamivir (TAMIFLU) capsule 30 mg        30 mg Per Tube Daily 06/21/22 1310 06/23/22 0941   06/19/22 2215  cefTRIAXone (ROCEPHIN) 2 g in sodium chloride 0.9 % 100 mL IVPB  Status:  Discontinued        2 g 200 mL/hr over 30 Minutes Intravenous Every 24 hours 06/19/22 1351 06/23/22 1023   06/19/22 1030  oseltamivir (TAMIFLU) capsule 30 mg        30 mg Per Tube Daily 06/19/22 0936 06/21/22 0931   06/18/22 2215  cefTRIAXone (ROCEPHIN) 1 g in sodium chloride 0.9 % 100 mL IVPB  Status:  Discontinued        1 g 200 mL/hr over 30 Minutes Intravenous Every 24 hours 06/17/22 2312 06/19/22 1351  06/18/22 2200  azithromycin (ZITHROMAX) 500 mg in sodium chloride 0.9 % 250 mL IVPB  Status:  Discontinued        500 mg 250 mL/hr over 60 Minutes Intravenous Every 24 hours 06/17/22 2312 06/23/22 1023   06/18/22 1000  oseltamivir (TAMIFLU) capsule 75 mg  Status:  Discontinued        75 mg Oral Daily 06/17/22 2244 06/17/22 2249   06/18/22 0000  oseltamivir (TAMIFLU) capsule 30 mg  Status:  Discontinued        30 mg Oral Daily 06/17/22 2249 06/19/22 0936   06/17/22 2130  cefTRIAXone (ROCEPHIN) 1 g in sodium chloride 0.9 % 100 mL IVPB        1 g 200 mL/hr over 30 Minutes Intravenous  Once 06/17/22 2121 06/17/22 2233   06/17/22 2130  azithromycin (ZITHROMAX) 500 mg in sodium chloride 0.9 % 250 mL IVPB        500 mg 250 mL/hr over 60 Minutes Intravenous  Once 06/17/22 2121 06/17/22 2327       Objective:  Vital Signs  Vitals:   07/02/22  2045 07/03/22 0000 07/03/22 0354 07/03/22 0838  BP:  111/63 127/61 122/73  Pulse:  99 96 94  Resp:  16 17   Temp:  98 F (36.7 C) 98.1 F (36.7 C) 97.6 F (36.4 C)  TempSrc:  Oral Oral Oral  SpO2: 91% 94% 92% 93%  Weight:   55.5 kg   Height:       No intake or output data in the 24 hours ending 07/03/22 1044 Filed Weights   07/01/22 0500 07/02/22 0405 07/03/22 0354  Weight: 58.8 kg 57.4 kg 55.5 kg    General appearance: Awake alert.  In no distress Resp: Clear to auscultation bilaterally.  Normal effort Cardio: S1-S2 is normal regular.  No S3-S4.  No rubs murmurs or bruit GI: Abdomen is soft.  Nontender nondistended.  Bowel sounds are present normal.  No masses organomegaly Extremities: No edema.     Lab Results:  Data Reviewed: I have personally reviewed following labs and reports of the imaging studies  CBC: Recent Labs  Lab 06/29/22 0203 06/30/22 0225 07/01/22 0250 07/02/22 0300 07/03/22 0222  WBC 15.8* 15.0* 15.6* 14.0* 12.8*  HGB 10.4* 10.3* 10.5* 10.4* 12.0  HCT 32.5* 32.0* 32.7* 32.3* 38.5  MCV 95.0 95.2 95.1 93.9 96.5  PLT 730* 800* 835* 834* 475*     Basic Metabolic Panel: Recent Labs  Lab 06/27/22 0711 06/28/22 0053 06/28/22 0730 06/28/22 1658 06/29/22 0203 06/30/22 0225 07/01/22 0250 07/02/22 0300 07/03/22 0222  NA 145   < >  --    < > 144 145 143 139 136  K 3.8   < >  --    < > 3.8 3.6 2.9* 3.9 5.7*  CL 106   < >  --    < > 107 109 107 106 105  CO2 29   < >  --    < > '28 28 28 24 '$ 21*  GLUCOSE 173*   < >  --    < > 260* 211* 53* 61* 262*  BUN 35*   < >  --    < > 39* 38* 33* 32* 30*  CREATININE 1.24*   < >  --    < > 1.04* 0.96 0.81 0.94 1.31*  CALCIUM 8.2*   < >  --    < > 8.3* 8.7* 8.9 8.5* 8.6*  MG 1.9  --  1.9  --   --   --   --   --   --    < > = values in this interval not displayed.     GFR: Estimated Creatinine Clearance: 26.3 mL/min (A) (by C-G formula based on SCr of 1.31 mg/dL (H)).   CBG: Recent Labs  Lab  07/02/22 0823 07/02/22 1152 07/02/22 1657 07/02/22 2045 07/03/22 0842  GLUCAP 79 294* 208* 198* 232*      Radiology Studies: No results found.     LOS: 16 days   Demarea Lorey [employer] on [email or web address]  07/03/2022, 10:44 AM

## 2022-07-03 NOTE — Care Management Important Message (Signed)
Important Message  Patient Details  Name: Nicole Montgomery MRN: [id number] Date of Birth: [birth year]   Medicare Important Message Given:  Yes     Shelda Altes 07/03/2022, 10:56 AM

## 2022-07-03 NOTE — Progress Notes (Signed)
Physical Therapy Treatment Patient Details Name: Nicole Montgomery MRN: [id number] DOB: 1940-07-24 Today's Date: 07/03/2022   History of Present Illness 82 yo female admitted 1/2 with AMS, DKA, flu (+). Intubated 1/3-1/11. Per chart fall 1/15. PMhx: T2DM, trigeminal neuralgia, HLD, HTN    PT Comments    Continues to make progress towards functional goals. Ambulates into hallway with 2L supplemental O2, 88% at lowest, 3/4 dyspnea, and requires prolonged standing rest break to recover before proceeding. Amb approx 35 feet with min assist for balance. Pt refuses RW but holds IV pole for support, may be open to rollator use and would be good for seated rest breaks (will try next visit.) Still appropriate for SNF at this level but may progress to HHPT (Pt currently refusing SNF.) We will work diligently with her to maximize progress in hospital as schedules allow. Patient will continue to benefit from skilled physical therapy services to further improve independence with functional mobility.    Recommendations for follow up therapy are one component of a multi-disciplinary discharge planning process, led by the attending physician.  Recommendations may be updated based on patient status, additional functional criteria and insurance authorization.  Follow Up Recommendations  Skilled nursing-short term rehab (<3 hours/day) Hopeful to progress to HHPT however currently SNF level due to reduced safety awareness and min assist with ambulation. Can patient physically be transported by private vehicle: Yes   Assistance Recommended at Discharge Frequent or constant Supervision/Assistance  Patient can return home with the following Assistance with cooking/housework;Assist for transportation;A little help with walking and/or transfers;Help with stairs or ramp for entrance   Equipment Recommendations  Other (comment) (TBD - considering Rollator after trial next visit)    Recommendations for Other Services        Precautions / Restrictions Precautions Precautions: Fall Precaution Comments: watch O2 and HR Restrictions Weight Bearing Restrictions: No     Mobility  Bed Mobility Overal bed mobility: Needs Assistance Bed Mobility: Supine to Sit, Sit to Supine     Supine to sit: Supervision, HOB elevated Sit to supine: Supervision   General bed mobility comments: Supervision for safety, slow but capable with cues to facilitate.    Transfers Overall transfer level: Needs assistance Equipment used: None Transfers: Sit to/from Stand Sit to Stand: Min guard           General transfer comment: Min guard for safety. Refused RW use. Unsteady upon standing, grabs IV pole for support, continues to decline RW but notices she is unsteady.    Ambulation/Gait Ambulation/Gait assistance: Min Web designer (Feet): 35 Feet Assistive device: IV Pole Gait Pattern/deviations: Step-through pattern, Decreased step length - right, Decreased step length - left, Staggering right Gait velocity: slow Gait velocity interpretation: <1.31 ft/sec, indicative of household ambulator   General Gait Details: Min assist on a couple of occasions for balance, especially with turns. Declines RW use, holding IV pole. More agreeable to trying rollator next visit. Required one 47mn standing rest break to complete distance. SPO2 88% on 2L supplemental O2 3/4 dyspnea. Resolved back to low 90s on 2L after sitting back down. Cues for gait symmetry, safety, awareness, and energy conservation.   Stairs             Wheelchair Mobility    Modified Rankin (Stroke Patients Only)       Balance Overall balance assessment: Needs assistance Sitting-balance support: No upper extremity supported, Feet supported Sitting balance-Leahy Scale: Good Sitting balance - Comments: Sits EOB without issues  Standing balance support: Single extremity supported Standing balance-Leahy Scale: Poor                               Cognition Arousal/Alertness: Awake/alert Behavior During Therapy: WFL for tasks assessed/performed Overall Cognitive Status: No family/caregiver present to determine baseline cognitive functioning Area of Impairment: Safety/judgement, Problem solving                         Safety/Judgement: Decreased awareness of safety, Decreased awareness of deficits   Problem Solving: Slow processing General Comments: States she doesn't need a walker. Proceeds to hold on to IV pole because she feels off balance, continues to refuse RW.        Exercises      General Comments General comments (skin integrity, edema, etc.): 2L supplemental O2 98% at rest, 88% amb.      Pertinent Vitals/Pain Pain Assessment Pain Assessment: No/denies pain    Home Living                          Prior Function            PT Goals (current goals can now be found in the care plan section) Acute Rehab PT Goals Patient Stated Goal: Go home PT Goal Formulation: Patient unable to participate in goal setting Time For Goal Achievement: 07/11/22 Potential to Achieve Goals: Fair Progress towards PT goals: Progressing toward goals    Frequency    Min 3X/week      PT Plan Current plan remains appropriate    Co-evaluation              AM-PAC PT "6 Clicks" Mobility   Outcome Measure  Help needed turning from your back to your side while in a flat bed without using bedrails?: A Little Help needed moving from lying on your back to sitting on the side of a flat bed without using bedrails?: A Little Help needed moving to and from a bed to a chair (including a wheelchair)?: A Little Help needed standing up from a chair using your arms (e.g., wheelchair or bedside chair)?: A Little Help needed to walk in hospital room?: A Little Help needed climbing 3-5 steps with a railing? : A Lot 6 Click Score: 17    End of Session Equipment Utilized During Treatment: Gait  belt Activity Tolerance: Patient limited by fatigue Patient left: with call bell/phone within reach;in bed;with bed alarm set Nurse Communication: Mobility status PT Visit Diagnosis: Other abnormalities of gait and mobility (R26.89);Muscle weakness (generalized) (M62.81)     Time: 6789-3810 PT Time Calculation (min) (ACUTE ONLY): 24 min  Charges:  $Gait Training: 8-22 mins $Therapeutic Activity: 8-22 mins                     Candie Mile, PT, DPT Physical Therapist Acute Rehabilitation Services [place]    Ellouise Newer 07/03/2022, 3:43 PM

## 2022-07-03 NOTE — Progress Notes (Signed)
Mobility Specialist - Progress Note   07/03/22 1045  Mobility  Activity Ambulated with assistance in room  Level of Assistance Minimal assist, patient does 75% or more  Assistive Device Front wheel walker  Distance Ambulated (ft) 5 ft  Activity Response Tolerated poorly  Mobility Referral Yes  $Mobility charge 1 Mobility    Pre-mobility: 95%SpO2 During mobility:97% SpO2 Post-mobility: 98% SpO2  Pt was received in bed and agreeable to mobility. Pt was ModI for bed mobility and MinA for standing EOB and ambulation. Upon standing pt requested to use BSC. Pt with successful void. Pt was able to take a few step before dizziness started and pt requested to sit down. Pt was left in chair with all needs met and chair alarm on.   Franki Monte  Mobility Specialist Please contact via Solicitor or Rehab office at [phone]

## 2022-07-03 NOTE — Progress Notes (Addendum)
Speech Language Pathology Treatment: Dysphagia  Patient Details Name: Nicole Montgomery MRN: [id number] DOB: 10-23-1940 Today's Date: 07/03/2022 Time: 9476-5465 SLP Time Calculation (min) (ACUTE ONLY): 14 min  Assessment / Plan / Recommendation Clinical Impression  Pt seen with RN and husband at bedside. During MBS pt was safe with nectar thick liquids when she took small sips however unable to take small sips during MBS and honey thick recommended. Today, treatment focused on small sips with nectar thick explaining rationale. She was consistently able to monitor sip size with cup, straw sips were a little larger. There were no signs of aspiration and her respiratory status remained stable throughout. Also a higher texture of regular was trialed. She initially did not appear to be masticating and stated "I let it get moist before I start to chew". Ultimately the cracker was thoroughly masticated without residual in timely manner. SLP will upgrade to Dys 3 (chopped meats) and nectar thick and reiterated taking small sips. She can use a straw if diligent although cup sips initially are preferred. Pt's husband present and updated on plan. Per chart plans are transfer to SNF when medically stable and recommend she continue these po's and SNF Speech Pathologist can make recommendations as needed for timing of repeat instrumental study as it is not recommended to repeat it at this time.   HPI HPI: Pt is a 82 y.o. female who presents with altered mental status, flu + and in DKA. Her husband is at the bedside and provides history-states that she began showing signs of confusion approximately one week ago.  She has had reduced appetite and barely been eating or drinking. ETT 1/3- 1/11 for sedation and O2 needs. CXR (06/22/22) revealed "Decreasing bilateral airspace disease". PMH: Type 2 DM on Ozempic, previous pancreatic adenocarcinoma, status post pancreatectomy, trigeminal Neuralgia, HL who follows with PCP in  [place].      SLP Plan  Continue with current plan of care      Recommendations for follow up therapy are one component of a multi-disciplinary discharge planning process, led by the attending physician.  Recommendations may be updated based on patient status, additional functional criteria and insurance authorization.    Recommendations  Diet recommendations: Dysphagia 3 (mechanical soft);Nectar-thick liquid Liquids provided via: Cup;Straw (cup preferred) Medication Administration: Whole meds with puree Supervision: Staff to assist with self feeding;Full supervision/cueing for compensatory strategies Compensations: Minimize environmental distractions;Slow rate;Small sips/bites Postural Changes and/or Swallow Maneuvers: Seated upright 90 degrees                Oral Care Recommendations: Oral care BID Follow Up Recommendations: Skilled nursing-short term rehab (<3 hours/day) Assistance recommended at discharge: Frequent or constant Supervision/Assistance SLP Visit Diagnosis: Dysphagia, oropharyngeal phase (R13.12) Plan: Continue with current plan of care           Houston Siren  07/03/2022, 11:51 AM

## 2022-07-03 NOTE — TOC Progression Note (Signed)
Transition of Care [facility]) - Progression Note    Patient Details  Name: Nicole Montgomery MRN: [id number] Date of Birth: 10-24-1940  Transition of Care [facility]) CM/SW [place], [place] Phone Number: 07/03/2022, 10:10 AM  Clinical Narrative:     Patient has SNF bed at [facility] when medically ready. Insurance authorization approved. CSW will continue to follow and assist with patients dc planning needs.  Expected Discharge Plan: [place] Barriers to Discharge: Continued Medical Work up  Expected Discharge Plan and Services In-house Referral: Clinical Social Work     Living arrangements for the past 2 months: Single Family Home                                       Social Determinants of Health (SDOH) Interventions SDOH Screenings   Tobacco Use: Low Risk  (06/27/2022)    Readmission Risk Interventions     No data to display

## 2022-07-04 DIAGNOSIS — R52 Pain, unspecified: Secondary | ICD-10-CM | POA: Diagnosis not present

## 2022-07-04 DIAGNOSIS — M792 Neuralgia and neuritis, unspecified: Secondary | ICD-10-CM | POA: Diagnosis not present

## 2022-07-04 DIAGNOSIS — G9341 Metabolic encephalopathy: Secondary | ICD-10-CM | POA: Diagnosis not present

## 2022-07-04 DIAGNOSIS — E1140 Type 2 diabetes mellitus with diabetic neuropathy, unspecified: Secondary | ICD-10-CM | POA: Diagnosis not present

## 2022-07-04 DIAGNOSIS — E119 Type 2 diabetes mellitus without complications: Secondary | ICD-10-CM | POA: Diagnosis not present

## 2022-07-04 DIAGNOSIS — C259 Malignant neoplasm of pancreas, unspecified: Secondary | ICD-10-CM | POA: Diagnosis not present

## 2022-07-04 DIAGNOSIS — R1319 Other dysphagia: Secondary | ICD-10-CM | POA: Diagnosis not present

## 2022-07-04 DIAGNOSIS — J9621 Acute and chronic respiratory failure with hypoxia: Secondary | ICD-10-CM | POA: Diagnosis not present

## 2022-07-04 DIAGNOSIS — Z7401 Bed confinement status: Secondary | ICD-10-CM | POA: Diagnosis not present

## 2022-07-04 DIAGNOSIS — E782 Mixed hyperlipidemia: Secondary | ICD-10-CM | POA: Diagnosis not present

## 2022-07-04 DIAGNOSIS — J9601 Acute respiratory failure with hypoxia: Secondary | ICD-10-CM | POA: Diagnosis not present

## 2022-07-04 DIAGNOSIS — J690 Pneumonitis due to inhalation of food and vomit: Secondary | ICD-10-CM | POA: Diagnosis not present

## 2022-07-04 DIAGNOSIS — Z862 Personal history of diseases of the blood and blood-forming organs and certain disorders involving the immune mechanism: Secondary | ICD-10-CM | POA: Diagnosis not present

## 2022-07-04 DIAGNOSIS — I10 Essential (primary) hypertension: Secondary | ICD-10-CM | POA: Diagnosis not present

## 2022-07-04 DIAGNOSIS — J189 Pneumonia, unspecified organism: Secondary | ICD-10-CM | POA: Diagnosis not present

## 2022-07-04 DIAGNOSIS — R112 Nausea with vomiting, unspecified: Secondary | ICD-10-CM | POA: Diagnosis not present

## 2022-07-04 DIAGNOSIS — G500 Trigeminal neuralgia: Secondary | ICD-10-CM | POA: Diagnosis not present

## 2022-07-04 DIAGNOSIS — J9600 Acute respiratory failure, unspecified whether with hypoxia or hypercapnia: Secondary | ICD-10-CM | POA: Diagnosis not present

## 2022-07-04 DIAGNOSIS — R1313 Dysphagia, pharyngeal phase: Secondary | ICD-10-CM | POA: Diagnosis not present

## 2022-07-04 DIAGNOSIS — Z9181 History of falling: Secondary | ICD-10-CM | POA: Diagnosis not present

## 2022-07-04 DIAGNOSIS — K5900 Constipation, unspecified: Secondary | ICD-10-CM | POA: Diagnosis not present

## 2022-07-04 DIAGNOSIS — J9612 Chronic respiratory failure with hypercapnia: Secondary | ICD-10-CM | POA: Diagnosis not present

## 2022-07-04 DIAGNOSIS — R531 Weakness: Secondary | ICD-10-CM | POA: Diagnosis not present

## 2022-07-04 DIAGNOSIS — J1001 Influenza due to other identified influenza virus with the same other identified influenza virus pneumonia: Secondary | ICD-10-CM | POA: Diagnosis not present

## 2022-07-04 DIAGNOSIS — N179 Acute kidney failure, unspecified: Secondary | ICD-10-CM | POA: Diagnosis not present

## 2022-07-04 DIAGNOSIS — J101 Influenza due to other identified influenza virus with other respiratory manifestations: Secondary | ICD-10-CM | POA: Diagnosis not present

## 2022-07-04 DIAGNOSIS — E1110 Type 2 diabetes mellitus with ketoacidosis without coma: Secondary | ICD-10-CM | POA: Diagnosis not present

## 2022-07-04 DIAGNOSIS — R0602 Shortness of breath: Secondary | ICD-10-CM | POA: Diagnosis not present

## 2022-07-04 DIAGNOSIS — R1310 Dysphagia, unspecified: Secondary | ICD-10-CM | POA: Diagnosis not present

## 2022-07-04 DIAGNOSIS — E1165 Type 2 diabetes mellitus with hyperglycemia: Secondary | ICD-10-CM | POA: Diagnosis not present

## 2022-07-04 DIAGNOSIS — J9611 Chronic respiratory failure with hypoxia: Secondary | ICD-10-CM | POA: Diagnosis not present

## 2022-07-04 DIAGNOSIS — E440 Moderate protein-calorie malnutrition: Secondary | ICD-10-CM | POA: Diagnosis not present

## 2022-07-04 LAB — BASIC METABOLIC PANEL
Anion gap: 7 (ref 5–15)
BUN: 26 mg/dL — ABNORMAL HIGH (ref 8–23)
CO2: 24 mmol/L (ref 22–32)
Calcium: 8.4 mg/dL — ABNORMAL LOW (ref 8.9–10.3)
Chloride: 105 mmol/L (ref 98–111)
Creatinine, Ser: 1.29 mg/dL — ABNORMAL HIGH (ref 0.44–1.00)
GFR, Estimated: 42 mL/min — ABNORMAL LOW (ref >60)
Glucose, Bld: 243 mg/dL — ABNORMAL HIGH (ref 70–99)
Potassium: 4 mmol/L (ref 3.5–5.1)
Sodium: 136 mmol/L (ref 135–145)

## 2022-07-04 LAB — GLUCOSE, CAPILLARY
Glucose-Capillary: 229 mg/dL — ABNORMAL HIGH (ref 70–99)
Glucose-Capillary: 303 mg/dL — ABNORMAL HIGH (ref 70–99)

## 2022-07-04 MED ORDER — GABAPENTIN 300 MG PO CAPS: 300.0000 mg | ORAL_CAPSULE | Freq: Two times a day (BID) | ORAL | Status: DC

## 2022-07-04 MED ORDER — CHLORHEXIDINE GLUCONATE CLOTH 2 % EX PADS
6.0000 | MEDICATED_PAD | Freq: Every day | CUTANEOUS | Status: DC
  Administered 2022-07-04: 6 via TOPICAL

## 2022-07-04 MED ORDER — PHENOL 1.4 % MT LIQD: 1.0000 | OROMUCOSAL | 0 refills | Status: DC | PRN

## 2022-07-04 MED ORDER — ALBUTEROL SULFATE (2.5 MG/3ML) 0.083% IN NEBU: 2.5000 mg | INHALATION_SOLUTION | RESPIRATORY_TRACT | 12 refills | Status: DC | PRN

## 2022-07-04 MED ORDER — INSULIN DETEMIR 100 UNIT/ML ~~LOC~~ SOLN: 8.0000 [IU] | Freq: Every day | SUBCUTANEOUS | 11 refills | Status: DC

## 2022-07-04 MED ORDER — ADULT MULTIVITAMIN W/MINERALS CH: 1.0000 | ORAL_TABLET | Freq: Every day | ORAL | Status: DC

## 2022-07-04 MED ORDER — ACETAMINOPHEN 325 MG PO TABS: 650.0000 mg | ORAL_TABLET | ORAL | Status: DC | PRN

## 2022-07-04 MED ORDER — INSULIN ASPART 100 UNIT/ML IJ SOLN: 0.0000 [IU] | Freq: Three times a day (TID) | INTRAMUSCULAR | 11 refills | Status: DC

## 2022-07-04 MED ORDER — AMITRIPTYLINE HCL 50 MG PO TABS: 50.0000 mg | ORAL_TABLET | Freq: Every day | ORAL | 0 refills | Status: AC

## 2022-07-04 NOTE — TOC Progression Note (Signed)
Transition of Care [facility]) - Progression Note    Patient Details  Name: Nicole Montgomery MRN: [id number] Date of Birth: 1941/06/03  Transition [facility]) CM/SW [place], [place] Phone Number: 07/04/2022, 9:58 AM  Clinical Narrative:     CSW spoke with Andee Poles with [facility] who confirmed patient can dc over today if medically ready. CSW informed MD. CSW will continue to follow and assist with patients dc planning needs.   Expected Discharge Plan: [place] Barriers to Discharge: Continued Medical Work up  Expected Discharge Plan and Services In-house Referral: Clinical Social Work     Living arrangements for the past 2 months: Single Family Home Expected Discharge Date: 07/04/22                                     Social Determinants of Health (SDOH) Interventions SDOH Screenings   Food Insecurity: No Food Insecurity (07/03/2022)  Housing: Low Risk  (07/03/2022)  Transportation Needs: No Transportation Needs (07/03/2022)  Utilities: Not At Risk (07/03/2022)  Tobacco Use: Low Risk  (06/27/2022)    Readmission Risk Interventions     No data to display

## 2022-07-04 NOTE — TOC Transition Note (Signed)
Transition of Care [facility]) - CM/SW Discharge Note   Patient Details  Name: Nicole Montgomery MRN: [id number] Date of Birth: [birth year]  Transition of Care [facility]) CM/SW Contact:  Milas Gain, [place] Phone Number: 07/04/2022, 12:32 PM   Clinical Narrative:     Patient will DC to: [facility]   Anticipated DC date: 07/04/2022  Family notified: [name] (Spouse)  Transport by: Corey Harold  ?  Per MD patient ready for DC to [facility] . RN, patient, patient's family, and facility notified of DC. Discharge Summary sent to facility. RN given number for report tele# [phone] RM# [phone] ask for 100 hall nurse. DC packet on chart. Ambulance transport requested for patient.  CSW signing off.    Final next level of care: Skilled Nursing Facility Barriers to Discharge: Continued Medical Work up   Patient Goals and CMS Choice CMS Medicare.gov Compare Post Acute Care list provided to:: Patient Represenative (must comment) (Patients spouse) Choice offered to / list presented to : Spouse  Discharge Placement                Patient chooses bed at: [facility] Patient to be transferred to facility by: [place] Name of family member notified: [name] (Spouse) Patient and family notified of of transfer: 07/04/22  Discharge Plan and Services Additional resources added to the After Visit Summary for   In-house Referral: Clinical Social Work                                   Social Determinants of Health ([place]) Interventions SDOH Screenings   Food Insecurity: No Food Insecurity (07/03/2022)  Housing: Low Risk  (07/03/2022)  Transportation Needs: No Transportation Needs (07/03/2022)  Utilities: Not At Risk (07/03/2022)  Tobacco Use: Low Risk  (06/27/2022)     Readmission Risk Interventions     No data to display

## 2022-07-04 NOTE — Discharge Summary (Signed)
[facility]  Physician Discharge Summary   Patient ID: Nicole Montgomery MRN: [id number] DOB/AGE: [birth year] 82 y.o.  Admit date: 06/17/2022 Discharge date:   07/04/2022   PCP: Marco Collie, MD  DISCHARGE DIAGNOSES:    Influenza A   Acute respiratory failure with hypoxia ([place])   On mechanically assisted ventilation ([place])   Pressure injury of skin   Malnutrition of moderate degree   Pneumonia of right lung due to infectious organism Diabetes mellitus type 2, uncontrolled with hyperglycemia   RECOMMENDATIONS FOR OUTPATIENT FOLLOW UP: Please check CBC and basic metabolic panel in 4 to 5 days PT OT and speech therapy to continue to follow.   Home Health: Going to SNF Equipment/Devices: None  CODE STATUS: Full code  DISCHARGE CONDITION: fair  Diet recommendation: Dysphagia 3 diet with nectar thick liquids  INITIAL HISTORY: 82 y.o. F with PMH significant for Type 2 DM on Ozempic, previous pancreatic adenocarcinoma, status post partial pancreatectomy, trigeminal Neuralgia, HL who follows with PCP in [place] who presents with altered mental status.  Her husband is at the bedside and provides history-states that she began showing signs of confusion approximately one week ago.  She has had reduced appetite and barely been eating or drinking.  Her confusion worsened to the point she was not responsive on the toilet and so he called EMS who noted glucose >600.   He notes they cared for grandchildren last week who had the flu.  She also has been coughing and short of breath without fever, though had a fall from standing on 12/31.  She did not lose consciousness at that time and did not want to come to the hospital.    In the ED, she was hypertensive, tachycardic, hypoxic requiring [place] and agitated.  CTH was completed without acute findings, labs significant for glucose >1200, bicarb 12, gap 20, beta hydroxy 1.6, WBC 11.3, pH 7.9 and flu A+ with R-sided opacities on CXR.  Her husband  states she may have missed her last ozempic injection secondary to her confusion.  PCCM consulted for admission given her AMS   Significant Events 1/2 presented with AMS, flu + and in DKA with significant agitation requiring precedex.  Started on antibiotic.  1/3 intubated for sedation needs and oxygen requirement. 1/4 transition from insulin drip to subQ  1/6 restarted on insulin overnight for hyperglycemia but anion gap remains closed.  Mild vent dyssynchrony with inability to tolerate SBT 1/7 no acute events overnight, glucose remains elevated will need up titration of insulin management.  Tolerating SBT trial this a.m. diuresed almost 5 L with use of diuretics 1/8 start Zosyn  1/10 more awake today, off propofol  1/11 QTC 511 overnight, was extubated today 1/12 awake, able to follow commands and move extremities, stopped Precedex, cortrak placed, tube feeds  1/13 on  at 4L 1/16 Mentation improving, still delirium. Still hypoxic with leukocytosis, starting bactrim per respiratory culture results. Pursuing SNF.   HOSPITAL COURSE:   Diabetes mellitus type 2 uncontrolled with hyperglycemia  DKA, resolved  Pancreatic adenocarcinoma s/p partial pancreatectomy (2012) A1c 10.1%. PTA listed as taking Ozempic.  Patient developed hypoglycemia due to poor oral intake.  Her insulin had to be held for couple of days.  Will discharge her on sliding scale coverage and low-dose Levemir.  Ozempic can be discontinued for now.   Acute hypoxic respiratory failure secondary to Influenza A with superimposed bacterial pneumonia and volume overload Chronic hypercarbic respiratory failure CXR 1/12 with patchy infiltrates in R and  L lung base 1/7 Trach culture: G - rods and G + cocci > few staph epidermidis and candida albicans.  - Completed zosyn 5 days. Given note of silent aspiration on MBS, suspect aspiration pneumonitis is causing persistent leukocytosis and hypoxia. Will monitor on modified diet.  Due  to persistent leukocytosis and evidence for this Staph epidermidis in the sputum patient was placed on Bactrim.  However patient had worsening renal function and hyperkalemia.  Bactrim was discontinued.  Since her respiratory status was otherwise improving and she was afebrile it was not thought to be necessary to continue antibiotics.  Continue to monitor closely.  Recheck labs to check WBC in 4 to 5 days.     Acute kidney injury/hyperkalemia Possibly due to Bactrim.  Bactrim was held.  She was given IV fluids with improvement in creatinine and potassium levels.     Dysphagia  Moderate protein calorie malnutrition Patient underwent modified barium swallow.  Currently on dysphagia 3 diet with nectar thick liquids.   Acute encephalopathy 1/4 MRI brain unremarkable.  Mentation has improved.   Trigeminal neuralgia: Continue home amitriptyline, duloxetine, gabapentin 300 mg BID   Prolonged QTc Repeat EKG showed QT 494.  Caution with QT prolonging meds   Normocytic anemia Stable.  No overt bleeding noted.   Reactive thrombocytosis Could be reflective of acute phase reactant.  Seems to be better.   Unwitnessed fall 1/15 Suspect low energy mechanism with no apparent injury.    Moderate protein calorie malnutrition Nutrition Problem: Moderate Malnutrition Etiology: chronic illness (pancreatic adenocarcinoma s/p pancreatectomy)   Multiple pressure injuries as outlined below Pressure Injury 06/18/22 Wrist Distal;Left;Lower;Posterior Stage 1 -  Intact skin with non-blanchable redness of a localized area usually over a bony prominence. piunk, Open (Active)  06/18/22 1409  Location: Wrist  Location Orientation: Distal;Left;Lower;Posterior  Staging: Stage 1 -  Intact skin with non-blanchable redness of a localized area usually over a bony prominence.  Wound Description (Comments): piunk, Open  Present on Admission: Yes     Pressure Injury 06/18/22 Arm Anterior;Left;Lower Stage 3 -  Full  thickness tissue loss. Subcutaneous fat may be visible but bone, tendon or muscle are NOT exposed. (Active)  06/18/22 1410  Location: Arm  Location Orientation: Anterior;Left;Lower  Staging: Stage 3 -  Full thickness tissue loss. Subcutaneous fat may be visible but bone, tendon or muscle are NOT exposed.  Wound Description (Comments):   Present on Admission: Yes     Pressure Injury 06/18/22 Arm Anterior;Left;Lower Stage 3 -  Full thickness tissue loss. Subcutaneous fat may be visible but bone, tendon or muscle are NOT exposed. (Active)  06/18/22 1410  Location: Arm  Location Orientation: Anterior;Left;Lower  Staging: Stage 3 -  Full thickness tissue loss. Subcutaneous fat may be visible but bone, tendon or muscle are NOT exposed.  Wound Description (Comments):   Present on Admission:      Pressure Injury 06/18/22 Face Right Stage 2 -  Partial thickness loss of dermis presenting as a shallow open injury with a red, pink wound bed without slough. Check, Pink (Active)  06/18/22 1411  Location: Face  Location Orientation: Right  Staging: Stage 2 -  Partial thickness loss of dermis presenting as a shallow open injury with a red, pink wound bed without slough.  Wound Description (Comments): Check, Pink  Present on Admission: Yes    Moderate protein calorie malnutrition Nutrition Problem: Moderate Malnutrition Etiology: chronic illness (pancreatic adenocarcinoma s/p pancreatectomy)   Patient is stable.  Okay for discharge to  SNF today.  PERTINENT LABS:  The results of significant diagnostics from this hospitalization (including imaging, microbiology, ancillary and laboratory) are listed below for reference.     Labs:   Basic Metabolic Panel: Recent Labs  Lab 06/28/22 0730 06/28/22 1658 06/30/22 0225 07/01/22 0250 07/02/22 0300 07/03/22 0222 07/04/22 0324  NA  --    < > 145 143 139 136 136  K  --    < > 3.6 2.9* 3.9 5.7* 4.0  CL  --    < > 109 107 106 105 105  CO2  --     < > '28 28 24 '$ 21* 24  GLUCOSE  --    < > 211* 53* 61* 262* 243*  BUN  --    < > 38* 33* 32* 30* 26*  CREATININE  --    < > 0.96 0.81 0.94 1.31* 1.29*  CALCIUM  --    < > 8.7* 8.9 8.5* 8.6* 8.4*  MG 1.9  --   --   --   --   --   --    < > = values in this interval not displayed.    CBC: Recent Labs  Lab 06/29/22 0203 06/30/22 0225 07/01/22 0250 07/02/22 0300 07/03/22 0222  WBC 15.8* 15.0* 15.6* 14.0* 12.8*  HGB 10.4* 10.3* 10.5* 10.4* 12.0  HCT 32.5* 32.0* 32.7* 32.3* 38.5  MCV 95.0 95.2 95.1 93.9 96.5  PLT 730* 800* 835* 834* 475*     CBG: Recent Labs  Lab 07/03/22 0842 07/03/22 1143 07/03/22 1656 07/03/22 2103 07/04/22 0731  GLUCAP 232* 245* 262* 219* 229*     IMAGING STUDIES DG Swallowing Func-Speech Pathology  Result Date: 06/30/2022 Table formatting from the original result was not included. Objective Swallowing Evaluation: Type of Study: MBS-Modified Barium Swallow Study  Patient Details Name: SHALAYNE LEACH MRN: [id number] Date of Birth: June 24, 1940 Today's Date: 06/30/2022 Time: SLP Start Time (ACUTE ONLY): 6644 -SLP Stop Time (ACUTE ONLY): 0347 SLP Time Calculation (min) (ACUTE ONLY): 17 min Past Medical History: Past Medical History: Diagnosis Date  DM (diabetes mellitus) ([place])   s/p pancreatectomy  H/O splenectomy 2012  HTN (hypertension)   Nephrolithiasis   Pancreatic cancer ([place]) 2012  Sarcoid 2012 Past Surgical History: Past Surgical History: Procedure Laterality Date  ABDOMINAL HYSTERECTOMY    APPENDECTOMY    CHOLECYSTECTOMY    PANCREATECTOMY  2012  PARTIAL COLECTOMY  2012  SPLENECTOMY  2012 HPI: Pt is a 82 y.o. female who presents with altered mental status, flu + and in DKA. Her husband is at the bedside and provides history-states that she began showing signs of confusion approximately one week ago.  She has had reduced appetite and barely been eating or drinking. ETT 1/3- 1/11 for sedation and O2 needs. CXR (06/22/22) revealed "Decreasing bilateral airspace disease".  PMH: Type 2 DM on Ozempic, previous pancreatic adenocarcinoma, status post pancreatectomy, trigeminal Neuralgia, HL who follows with PCP in [place].  No data recorded  Recommendations for follow up therapy are one component of a multi-disciplinary discharge planning process, led by the attending physician.  Recommendations may be updated based on patient status, additional functional criteria and insurance authorization. Assessment / Plan / Recommendation   06/30/2022  12:23 PM Clinical Impressions Clinical Impression Pt exhibits a moderate oropharyngeal dysphagia marked by silent aspiration with thin liquid. Oral phase marked by delayed propulsion needing intermittent cues to "swallow".  There was delayed swallow initation, reduced tongue base retraction, decreased laryngeal elevation and closure. A chin  tuck posture resulted in increased aspiration with thin and initially small sips nectar did not enter airway but at end of study she could not take small sips when cues and larger sips nectar were consistently aspirated. Honey thick did not enter airway and did not leave increase pharyngeal retention. There was max residue with regular texture, prolonged mastication and obvious fatigue. Indications of reduced pharyngoesophageal segment with residue below the UES. Esophageal scan revealed stasis and what appeared to be possibly slower contractions. Recommend puree (Dys 1) and honey thick, meds crushed. SLP can work with pt on smaller sips to see if she can upgrade to nectar if consistently taking smaller sips and upgrade from puree. SLP Visit Diagnosis Dysphagia, oropharyngeal phase (R13.12) Impact on safety and function Moderate aspiration risk     06/30/2022  12:23 PM Treatment Recommendations Treatment Recommendations Therapy as outlined in treatment plan below     06/30/2022  12:23 PM Prognosis Prognosis for Safe Diet Advancement Good   06/30/2022  12:23 PM Diet Recommendations SLP Diet Recommendations Dysphagia 1  (Puree) solids;Honey thick liquids Liquid Administration via Cup;Straw Medication Administration Crushed with puree Compensations Minimize environmental distractions;Slow rate;Small sips/bites Postural Changes Seated upright at 90 degrees     06/30/2022  12:23 PM Other Recommendations Oral Care Recommendations Oral care BID Follow Up Recommendations Skilled nursing-short term rehab (<3 hours/day) Functional Status Assessment Patient has had a recent decline in their functional status and demonstrates the ability to make significant improvements in function in a reasonable and predictable amount of time.   06/30/2022  12:23 PM Frequency and Duration  Speech Therapy Frequency (ACUTE ONLY) min 2x/week Treatment Duration 2 weeks     06/30/2022  12:23 PM Oral Phase Oral Phase Impaired Oral - Honey Cup Delayed oral transit;Weak lingual manipulation Oral - Nectar Teaspoon Delayed oral transit;Weak lingual manipulation Oral - Nectar Cup Delayed oral transit;Weak lingual manipulation Oral - Thin Teaspoon Delayed oral transit;Weak lingual manipulation Oral - Thin Cup Delayed oral transit;Weak lingual manipulation Oral - Puree Delayed oral transit;Weak lingual manipulation Oral - Regular Delayed oral transit;Weak lingual manipulation    06/30/2022  12:23 PM Pharyngeal Phase Pharyngeal Phase Impaired Pharyngeal- Honey Cup Pharyngeal residue - valleculae;Reduced tongue base retraction;Reduced epiglottic inversion Pharyngeal- Nectar Teaspoon Penetration/Aspiration during swallow Pharyngeal Material does not enter airway Pharyngeal- Nectar Cup Delayed swallow initiation-pyriform sinuses;Pharyngeal residue - valleculae;Pharyngeal residue - pyriform;Penetration/Aspiration during swallow;Reduced airway/laryngeal closure;Reduced laryngeal elevation;Reduced epiglottic inversion Pharyngeal Material enters airway, passes BELOW cords without attempt by patient to eject out (silent aspiration) Pharyngeal- Thin Teaspoon Penetration/Aspiration  during swallow;Reduced laryngeal elevation;Reduced airway/laryngeal closure;Reduced tongue base retraction;Reduced epiglottic inversion;Reduced pharyngeal peristalsis;Delayed swallow initiation-pyriform sinuses Pharyngeal Material enters airway, passes BELOW cords without attempt by patient to eject out (silent aspiration) Pharyngeal- Thin Cup Penetration/Aspiration during swallow;Reduced pharyngeal peristalsis;Reduced epiglottic inversion;Reduced anterior laryngeal mobility;Reduced laryngeal elevation;Reduced airway/laryngeal closure;Reduced tongue base retraction Pharyngeal Material enters airway, passes BELOW cords without attempt by patient to eject out (silent aspiration) Pharyngeal- Puree Pharyngeal residue - valleculae;Reduced tongue base retraction Pharyngeal- Regular Pharyngeal residue - valleculae;Reduced pharyngeal peristalsis;Reduced tongue base retraction    06/30/2022  12:23 PM Cervical Esophageal Phase  Cervical Esophageal Phase Impaired [place] Siren 06/30/2022, 2:05 PM                     DG CHEST PORT 1 VIEW  Result Date: 06/27/2022 CLINICAL DATA:  Shortness of breath.  [id number] EXAM: PORTABLE CHEST 1 VIEW COMPARISON:  06/22/2022 FINDINGS: Endotracheal tube has been with removed or withdrawn. Nasogastric  tube has been removed. Feeding tube has been placed, tip off the image, at least to the level of the mid stomach. Heart size is normal. Patchy infiltrates are again identified in RIGHT lung and LEFT lung base, similar in appearance to study. IMPRESSION: 1. Interval removal of endotracheal tube. 2. Interval placement of feeding tube, tip at least to the level of the mid stomach. Electronically Signed   By: Nolon Nations M.D.   On: 06/27/2022 11:43   DG Abd Portable 1V  Result Date: 06/27/2022 CLINICAL DATA:  Feeding tube placement. EXAM: PORTABLE ABDOMEN - 1 VIEW COMPARISON:  One view chest x-ray 06/22/2022. One-view abdomen 06/18/2022. FINDINGS: The tip of a small bore feeding tube is  in the distal stomach. Right upper lobe and left lower lobe airspace opacities are similar the prior exam. There is some clearing at the right base. IMPRESSION: 1. Tip of small bore feeding tube is in the distal stomach. 2. Stable right upper lobe and left lower lobe airspace disease with some improvement at the right base. Electronically Signed   By: San Morelle M.D.   On: 06/27/2022 11:18   DG Chest Port 1 View  Result Date: 06/22/2022 CLINICAL DATA:  Hyperglycemia, hypoxia. EXAM: PORTABLE CHEST 1 VIEW COMPARISON:  Chest x-ray 06/21/2022 FINDINGS: Endotracheal tube tip is 3.5 cm above the carina. Enteric tube tip extends into the stomach. The cardiomediastinal silhouette is stable. Bilateral airspace disease, right greater than left, has decreased. There is no pleural effusion or pneumothorax. No acute fractures are seen. IMPRESSION: Decreasing bilateral airspace disease. Electronically Signed   By: Ronney Asters M.D.   On: 06/22/2022 21:35   ECHOCARDIOGRAM COMPLETE  Result Date: 06/22/2022    ECHOCARDIOGRAM REPORT   Patient Name:   GILLIE FLEITES Date of Exam: 06/22/2022 Medical Rec #:  [id number]      Height:       60.0 in Accession #:    [id number]     Weight:       144.8 lb Date of Birth:  22-Nov-1940       BSA:          1.627 m Patient Age:    48 years       BP:           134/63 mmHg Patient Gender: F              HR:           113 bpm. Exam Location:  Inpatient Procedure: 2D Echo Indications:    acute respiratory distress  History:        Patient has no prior history of Echocardiogram examinations.                 Risk Factors:Dyslipidemia.  Sonographer:    Harvie Junior Referring Phys: [place]  1. Left ventricular ejection fraction, by estimation, is 60 to 65%. The left ventricle has normal function. Left ventricular endocardial border not optimally defined to evaluate regional wall motion. Left ventricular diastolic parameters are consistent with Grade I diastolic  dysfunction (impaired relaxation).  2. Right ventricular systolic function is normal. The right ventricular size is normal. There is normal pulmonary artery systolic pressure. The estimated right ventricular systolic pressure is 29.9 mmHg.  3. The mitral valve is abnormal. No evidence of mitral valve regurgitation. No evidence of mitral stenosis.  4. The aortic valve is tricuspid. Aortic valve regurgitation is not visualized. No aortic stenosis is present.  5. The inferior  vena cava is normal in size with greater than 50% respiratory variability, suggesting right atrial pressure of 3 mmHg. Comparison(s): No prior Echocardiogram. FINDINGS  Left Ventricle: Left ventricular ejection fraction, by estimation, is 60 to 65%. The left ventricle has normal function. Left ventricular endocardial border not optimally defined to evaluate regional wall motion. The left ventricular internal cavity size was normal in size. There is no left ventricular hypertrophy. Left ventricular diastolic parameters are consistent with Grade I diastolic dysfunction (impaired relaxation). Right Ventricle: The right ventricular size is normal. No increase in right ventricular wall thickness. Right ventricular systolic function is normal. There is normal pulmonary artery systolic pressure. The tricuspid regurgitant velocity is 1.91 m/s, and  with an assumed right atrial pressure of 3 mmHg, the estimated right ventricular systolic pressure is 46.5 mmHg. Left Atrium: Left atrial size was normal in size. Right Atrium: Right atrial size was normal in size. Pericardium: There is no evidence of pericardial effusion. Mitral Valve: The mitral valve is abnormal. There is moderate calcification of the posterior mitral valve leaflet(s). Mild to moderate mitral annular calcification. No evidence of mitral valve regurgitation. No evidence of mitral valve stenosis. Tricuspid Valve: The tricuspid valve is not well visualized. Tricuspid valve regurgitation is not  demonstrated. No evidence of tricuspid stenosis. Aortic Valve: The aortic valve is tricuspid. There is mild aortic valve annular calcification. Aortic valve regurgitation is not visualized. No aortic stenosis is present. Aortic valve mean gradient measures 9.0 mmHg. Aortic valve peak gradient measures 14.9 mmHg. Aortic valve area, by VTI measures 3.63 cm. Pulmonic Valve: The pulmonic valve was not well visualized. Pulmonic valve regurgitation is not visualized. No evidence of pulmonic stenosis. Aorta: The aortic root and ascending aorta are structurally normal, with no evidence of dilitation. Venous: The inferior vena cava is normal in size with greater than 50% respiratory variability, suggesting right atrial pressure of 3 mmHg. IAS/Shunts: No atrial level shunt detected by color flow Doppler.  LEFT VENTRICLE PLAX 2D LVIDd:         3.70 cm     Diastology LVIDs:         2.30 cm     LV e' medial:    9.46 cm/s LV PW:         0.90 cm     LV E/e' medial:  11.1 LV IVS:        0.90 cm     LV e' lateral:   9.46 cm/s LVOT diam:     2.00 cm     LV E/e' lateral: 11.1 LV SV:         114 LV SV Index:   70 LVOT Area:     3.14 cm  LV Volumes (MOD) LV vol d, MOD A2C: 47.2 ml LV vol d, MOD A4C: 78.5 ml LV vol s, MOD A2C: 11.9 ml LV vol s, MOD A4C: 21.0 ml LV SV MOD A2C:     35.3 ml LV SV MOD A4C:     78.5 ml LV SV MOD BP:      46.4 ml RIGHT VENTRICLE RV Basal diam:  2.80 cm RV Mid diam:    2.20 cm RV S prime:     18.10 cm/s TAPSE (M-mode): 1.7 cm LEFT ATRIUM             Index        RIGHT ATRIUM           Index LA diam:        2.70  cm 1.66 cm/m   RA Area:     10.60 cm LA Vol (A2C):   24.9 ml 15.30 ml/m  RA Volume:   20.60 ml  12.66 ml/m LA Vol (A4C):   33.8 ml 20.77 ml/m LA Biplane Vol: 31.4 ml 19.29 ml/m  AORTIC VALVE                     PULMONIC VALVE AV Area (Vmax):    3.37 cm      PV Vmax:       1.42 m/s AV Area (Vmean):   3.28 cm      PV Peak grad:  8.1 mmHg AV Area (VTI):     3.63 cm AV Vmax:           193.00 cm/s  AV Vmean:          138.000 cm/s AV VTI:            0.314 m AV Peak Grad:      14.9 mmHg AV Mean Grad:      9.0 mmHg LVOT Vmax:         207.00 cm/s LVOT Vmean:        144.000 cm/s LVOT VTI:          0.363 m LVOT/AV VTI ratio: 1.15  AORTA Ao Root diam: 3.10 cm Ao Asc diam:  3.00 cm MITRAL VALVE                TRICUSPID VALVE MV Area (PHT): 3.91 cm     TR Peak grad:   14.6 mmHg MV Decel Time: 194 msec     TR Vmax:        191.00 cm/s MR Peak grad: 25.6 mmHg MR Vmax:      253.00 cm/s   SHUNTS MV E velocity: 105.00 cm/s  Systemic VTI:  0.36 m MV A velocity: 121.00 cm/s  Systemic Diam: 2.00 cm MV E/A ratio:  0.87 Vishnu Priya Mallipeddi Electronically signed by Lorelee Cover Mallipeddi Signature Date/Time: 06/22/2022/12:51:24 PM    Final    DG Chest Port 1 View  Result Date: 06/21/2022 CLINICAL DATA:  Acute respiratory failure EXAM: PORTABLE CHEST 1 VIEW COMPARISON:  Three days ago FINDINGS: Endotracheal tube with tip just below the clavicular heads. The enteric tube reaches the stomach. Artifact from EKG leads. Bilateral airspace disease more extensive on the right and mildly progressed. No pneumothorax or definite pleural fluid. Stable heart size. IMPRESSION: 1. Unremarkable hardware. 2. Worsening pneumonia. Electronically Signed   By: Jorje Guild M.D.   On: 06/21/2022 08:01   MR BRAIN WO CONTRAST  Result Date: 06/19/2022 CLINICAL DATA:  Acute neurologic deficit.  Trigeminal neuralgia. EXAM: MRI HEAD WITHOUT CONTRAST TECHNIQUE: Multiplanar, multiecho pulse sequences of the brain and surrounding structures were obtained without intravenous contrast. COMPARISON:  02/05/2015 FINDINGS: Brain: No acute infarct, mass effect or extra-axial collection. No acute or chronic hemorrhage. Normal white matter signal, parenchymal volume and CSF spaces. The midline structures are normal. Vascular: Major flow voids are preserved. Skull and upper cervical spine: Normal calvarium and skull base. Visualized upper cervical spine and  soft tissues are normal. Sinuses/Orbits:Moderate diffuse paranasal sinus disease. Trace mastoid fluid bilaterally. There is fluid in the nasopharynx. Bilateral ocular lens replacements. Normal orbits. IMPRESSION: 1. No acute intracranial abnormality. 2. Moderate diffuse paranasal sinus disease. Electronically Signed   By: Ulyses Jarred M.D.   On: 06/19/2022 01:56   DG Abd 1 View  Result Date: 06/18/2022 CLINICAL DATA:  OG tube placement EXAM: ABDOMEN - 1 VIEW COMPARISON:  06/18/2022 FINDINGS: Heterogeneous airspace disease in the right lower lung. Esophageal tube tip overlies the proximal duodenum. Air distended bowel in the upper abdomen. IMPRESSION: Esophageal tube tip overlies the proximal duodenum. Air distended bowel in the upper abdomen. Electronically Signed   By: Donavan Foil M.D.   On: 06/18/2022 19:31   DG Abd 1 View  Result Date: 06/18/2022 CLINICAL DATA:  Evaluate OG tube placement EXAM: ABDOMEN - 1 VIEW COMPARISON:  Earlier today FINDINGS: The enteric tube tip and side port are in the gastric fundus and well below the level of the GE junction. Gaseous distension of the bowel loops are again noted and appears similar. Bilateral pulmonary opacities are noted, right greater than left. IMPRESSION: 1. Enteric tube tip and side port are in the gastric fundus, below the level of the GE junction. 2. Persistent gaseous distension of the bowel loops. Electronically Signed   By: Kerby Moors M.D.   On: 06/18/2022 17:32   DG Abd Portable 1 View  Result Date: 06/18/2022 CLINICAL DATA:  OG tube placement EXAM: PORTABLE ABDOMEN - 1 VIEW COMPARISON:  06/17/2022 FINDINGS: No esophagogastric tube is identified in the included portions of the abdomen, which exclude the hemidiaphragms and lung bases as well as a portion of the left hemiabdomen. Diffusely gas-filled small bowel and colon throughout, similar to prior examination. No obvious free air. IMPRESSION: 1. No esophagogastric tube is identified in the  included portions of the abdomen, which exclude the hemidiaphragms and lung bases as well as a portion of the left hemiabdomen. 2. Diffusely gas-filled small bowel and colon throughout, similar to prior examination. No obvious free air. Electronically Signed   By: Delanna Ahmadi M.D.   On: 06/18/2022 12:52   Portable Chest x-ray  Result Date: 06/18/2022 CLINICAL DATA:  Endotracheal tube EXAM: PORTABLE CHEST 1 VIEW COMPARISON:  CXR 06/17/22 FINDINGS: Interval intubation with the endotracheal tube terminating approximately 1 cm above the carina, approaching the right mainstem bronchus. Enteric tube courses below the diaphragm with the side hole and tip terminating in the stomach. No pleural effusion. No pneumothorax. There is a persistent focal opacity in the medial aspect of the right lung base, which remains worrisome for infection. Additional multifocal pulmonary opacities are also present in the right mid lung as well as the left lung base. Aortic atherosclerotic calcifications. Cardiac and mediastinal contours are unchanged from prior exam. No displaced rib fractures. Visualized upper abdomen is notable for a gas distended stomach and prominent gas distended loops of small and large bowel. IMPRESSION: 1. Interval intubation with the endotracheal tube terminating approximately 1 cm above the carina, approaching the right mainstem bronchus. Recommend retracting by 1 cm. 2. Persistent pulmonary opacities in the right mid and lower lung as well as the left lung base remain worrisome for multifocal pneumonia. Electronically Signed   By: Marin Roberts M.D.   On: 06/18/2022 12:47   [place] EKG SITE RITE  Result Date: 06/18/2022 If Site Rite image not attached, placement could not be confirmed due to current cardiac rhythm.  DG Abdomen 1 View  Result Date: 06/17/2022 CLINICAL DATA:  Altered mental status. EXAM: ABDOMEN - 1 VIEW COMPARISON:  None Available. FINDINGS: Mild atelectasis and/or infiltrate is seen within the  visualized portions of the bilateral lung bases. The bowel gas pattern is normal. Multiple radiopaque surgical coils are seen overlying the mid and upper abdomen. A distended urinary bladder is noted. No radio-opaque calculi or  other significant radiographic abnormality are seen. IMPRESSION: Nonobstructive bowel gas pattern. Electronically Signed   By: Virgina Norfolk M.D.   On: 06/17/2022 21:27   DG Chest Portable 1 View  Result Date: 06/17/2022 CLINICAL DATA:  Hypoxia EXAM: PORTABLE CHEST 1 VIEW COMPARISON:  CT 11/12/2007, chest x-ray 10/08/2017 FINDINGS: Heterogeneous ground-glass opacity in the right mid and lower lung. Linear scarring or atelectasis in the left mid lung. Stable cardiomediastinal silhouette with aortic atherosclerosis. No pneumothorax. Moderate air distended bowel in the upper abdomen. IMPRESSION: Heterogeneous ground-glass opacity in the right mid and lower lung, suspicious for pneumonia. Moderate air distended bowel in the upper abdomen. This may be correlated with dedicated abdominal radiograph. Electronically Signed   By: Donavan Foil M.D.   On: 06/17/2022 21:08   CT HEAD WO CONTRAST  Result Date: 06/17/2022 CLINICAL DATA:  Neuro deficit, acute stroke suspected. Patient brought to ED after being found unconscious. EXAM: CT HEAD WITHOUT CONTRAST TECHNIQUE: Contiguous axial images were obtained from the base of the skull through the vertex without intravenous contrast. RADIATION DOSE REDUCTION: This exam was performed according to the departmental dose-optimization program which includes automated exposure control, adjustment of the mA and/or kV according to patient size and/or use of iterative reconstruction technique. COMPARISON:  CT examination dated May 22, 2010 FINDINGS: Brain: No evidence of acute infarction, hemorrhage, hydrocephalus, extra-axial collection or mass lesion/mass effect. Mild cerebral atrophy and chronic microvascular ischemic changes of the white matter.  Vascular: No hyperdense vessel or unexpected calcification. Skull: Normal. Negative for fracture or focal lesion. Sinuses/Orbits: No acute finding. Other: None. IMPRESSION: 1. No acute intracranial abnormality. 2. Mild cerebral atrophy and chronic microvascular ischemic changes of the white matter. Electronically Signed   By: Keane Police D.O.   On: 06/17/2022 21:06    DISCHARGE EXAMINATION: Vitals:   07/03/22 2000 07/04/22 0259 07/04/22 0616 07/04/22 0727  BP: 138/65  (!) 145/78 (!) 141/84  Pulse: 92 96 94 90  Resp: '18  18 20  '$ Temp: 97.6 F (36.4 C)  97.8 F (36.6 C) 98.4 F (36.9 C)  TempSrc: Oral  Oral Oral  SpO2: 96% 96% 95% 97%  Weight:  54.8 kg    Height:       General appearance: Awake alert.  In no distress Resp: Clear to auscultation bilaterally.  Normal effort Cardio: S1-S2 is normal regular.  No S3-S4.  No rubs murmurs or bruit GI: Abdomen is soft.  Nontender nondistended.  Bowel sounds are present normal.  No masses organomegaly   DISPOSITION: SNF  Discharge Instructions     Call MD for:  difficulty breathing, headache or visual disturbances   Complete by: As directed    Call MD for:  extreme fatigue   Complete by: As directed    Call MD for:  persistant dizziness or light-headedness   Complete by: As directed    Call MD for:  persistant nausea and vomiting   Complete by: As directed    Call MD for:  severe uncontrolled pain   Complete by: As directed    Call MD for:  temperature >100.4   Complete by: As directed    Discharge instructions   Complete by: As directed    Please review instructions on the discharge summary.  You were cared for by a hospitalist during your hospital stay. If you have any questions about your discharge medications or the care you received while you were in the hospital after you are discharged, you can call the unit and  asked to speak with the hospitalist on call if the hospitalist that took care of you is not available. Once you are  discharged, your primary care physician will handle any further medical issues. Please note that NO REFILLS for any discharge medications will be authorized once you are discharged, as it is imperative that you return to your primary care physician (or establish a relationship with a primary care physician if you do not have one) for your aftercare needs so that they can reassess your need for medications and monitor your lab values. If you do not have a primary care physician, you can call [phone] for a physician referral.   Discharge wound care:   Complete by: As directed    Wound care  Daily      Comments: Apply Xeroform gauze to arm wounds Q day, then cover with gauze and kerlex   Increase activity slowly   Complete by: As directed    No wound care   Complete by: As directed           Allergies as of 07/04/2022       Reactions   Codeine Other (See Comments)   Pt states she gets really sweaty and light-headed, like she's going to pass out.        Medication List     STOP taking these medications    celecoxib 100 MG capsule Commonly known as: CELEBREX   Ozempic (0.25 or 0.5 MG/DOSE) 2 MG/1.5ML Sopn Generic drug: Semaglutide(0.25 or 0.'5MG'$ /DOS)       TAKE these medications    acetaminophen 325 MG tablet Commonly known as: TYLENOL Take 2 tablets (650 mg total) by mouth every 4 (four) hours as needed for fever, moderate pain, mild pain or headache.   albuterol (2.5 MG/3ML) 0.083% nebulizer solution Commonly known as: PROVENTIL Take 3 mLs (2.5 mg total) by nebulization every 4 (four) hours as needed for wheezing or shortness of breath.   amitriptyline 50 MG tablet Commonly known as: ELAVIL Take 1 tablet (50 mg total) by mouth at bedtime.   DULoxetine 60 MG capsule Commonly known as: CYMBALTA Take 60 mg by mouth daily.   gabapentin 300 MG capsule Commonly known as: NEURONTIN Take 1 capsule (300 mg total) by mouth 2 (two) times daily. What changed:  when to  take this Another medication with the same name was removed. Continue taking this medication, and follow the directions you see here.   insulin aspart 100 UNIT/ML injection Commonly known as: novoLOG Inject 0-9 Units into the skin 3 (three) times daily with meals. Use following sliding scale: CBG < 70: Implement Hypoglycemia Standing Orders and refer to Hypoglycemia Standing Orders sidebar report CBG 70 - 120: 0 units CBG 121 - 150: 1 unit CBG 151 - 200: 2 units CBG 201 - 250: 3 units CBG 251 - 300: 5 units CBG 301 - 350: 7 units CBG 351 - 400: 9 units CBG > 400: call MD and obtain STAT lab verification   insulin detemir 100 UNIT/ML injection Commonly known as: LEVEMIR Inject 0.08 mLs (8 Units total) into the skin daily.   multivitamin with minerals Tabs tablet Take 1 tablet by mouth daily.   ondansetron 4 MG tablet Commonly known as: ZOFRAN Take 4 mg by mouth every 8 (eight) hours as needed for nausea or vomiting.   phenol 1.4 % Liqd Commonly known as: CHLORASEPTIC Use as directed 1 spray in the mouth or throat as needed for throat irritation / pain.  polyethylene glycol powder 17 GM/SCOOP powder Commonly known as: GLYCOLAX/MIRALAX Take 17 g by mouth daily as needed for mild constipation.   pravastatin 80 MG tablet Commonly known as: PRAVACHOL Take 80 mg by mouth daily.               Discharge Care Instructions  (From admission, onward)           Start     Ordered   07/04/22 0000  Discharge wound care:       Comments: Wound care  Daily      Comments: Apply Xeroform gauze to arm wounds Q day, then cover with gauze and kerlex   07/04/22 0926              Follow-up Information     Marco Collie, MD. Schedule an appointment as soon as possible for a visit.   Specialty: Family Medicine Contact information: [address] [place] [address] [phone]                 TOTAL DISCHARGE TIME: 57 minutes  [facility] Pager on [email or web address]  07/04/2022, 9:27 AM

## 2022-07-04 NOTE — Progress Notes (Signed)
Occupational Therapy Treatment Patient Details Name: Nicole Montgomery MRN: [id number] DOB: 12/24/1940 Today's Date: 07/04/2022   History of present illness 82 yo female admitted 1/2 with AMS, DKA, flu (+). Intubated 1/3-1/11. Per chart fall 1/15. PMhx: T2DM, trigeminal neuralgia, HLD, HTN   OT comments  Pt awaiting discharge to rehab with husband. Hand held assist to ambulate to bathroom, min assist for toileting and min guard for hand washing at sink. Returned to chair with warm blankets. Sp02 95% on 2L.   Recommendations for follow up therapy are one component of a multi-disciplinary discharge planning process, led by the attending physician.  Recommendations may be updated based on patient status, additional functional criteria and insurance authorization.    Follow Up Recommendations  Skilled nursing-short term rehab (<3 hours/day)     Assistance Recommended at Discharge Frequent or constant Supervision/Assistance  Patient can return home with the following  Assistance with cooking/housework;Direct supervision/assist for medications management;Direct supervision/assist for financial management;Assist for transportation;Help with stairs or ramp for entrance;A lot of help with walking and/or transfers;A lot of help with bathing/dressing/bathroom   Equipment Recommendations  Other (comment) (defer to next venue)    Recommendations for Other Services      Precautions / Restrictions Precautions Precautions: Fall Precaution Comments: watch O2 and HR Restrictions Weight Bearing Restrictions: No       Mobility Bed Mobility               General bed mobility comments: in chair, returned to chair    Transfers Overall transfer level: Needs assistance Equipment used: 1 person hand held assist Transfers: Sit to/from Stand Sit to Stand: Min guard           General transfer comment: hand held assist, use of grab bar     Balance Overall balance assessment: Needs  assistance   Sitting balance-Leahy Scale: Good     Standing balance support: Single extremity supported Standing balance-Leahy Scale: Poor Standing balance comment: reliant on one hand assist                           ADL either performed or assessed with clinical judgement   ADL Overall ADL's : Needs assistance/impaired     Grooming: Min guard;Standing;Wash/dry Geophysical data processor Transfer: Minimal assistance;Ambulation   Toileting- Clothing Manipulation and Hygiene: Minimal assistance;Sit to/from stand       Functional mobility during ADLs: Minimal assistance      Extremity/Trunk Assessment              Vision       Perception     Praxis      Cognition Arousal/Alertness: Awake/alert Behavior During Therapy: Flat affect Overall Cognitive Status: Impaired/Different from baseline Area of Impairment: Safety/judgement, Problem solving                             Problem Solving: Slow processing General Comments: does not call for help when appropriate, pt Nicole Montgomery        Exercises      Shoulder Instructions       General Comments      Pertinent Vitals/ Pain       Pain Assessment Pain Assessment: No/denies pain  Home Living  Prior Functioning/Environment              Frequency  Min 2X/week        Progress Toward Goals  OT Goals(current goals can now be found in the care plan section)  Progress towards OT goals: Progressing toward goals  Acute Rehab OT Goals Time For Goal Achievement: 07/12/22 Potential to Achieve Goals: Good  Plan Discharge plan remains appropriate    Co-evaluation                 AM-PAC OT "6 Clicks" Daily Activity     Outcome Measure   Help from another person eating meals?: None Help from another person taking care of personal grooming?: A Little Help from another person toileting, which includes using  toliet, bedpan, or urinal?: A Little Help from another person bathing (including washing, rinsing, drying)?: A Lot Help from another person to put on and taking off regular upper body clothing?: A Little Help from another person to put on and taking off regular lower body clothing?: A Lot 6 Click Score: 17    End of Session Equipment Utilized During Treatment: Oxygen (2L)  OT Visit Diagnosis: Other abnormalities of gait and mobility (R26.89);Muscle weakness (generalized) (M62.81);Other symptoms and signs involving cognitive function   Activity Tolerance Patient tolerated treatment well   Patient Left in chair;with call bell/phone within reach;with family/visitor present;with chair alarm set   Nurse Communication          Time: 1413-1430 OT Time Calculation (min): 17 min  Charges: OT General Charges $OT Visit: 1 Visit OT Treatments $Self Care/Home Management : 8-22 mins  Nicole Montgomery, OTR/L Acute Rehabilitation Services Office: [phone]  Nicole Montgomery 07/04/2022, 2:47 PM

## 2022-07-04 NOTE — Progress Notes (Signed)
Mobility Specialist - Progress Note   07/04/22 0958  Mobility  Activity Transferred to/from [facility];Transferred from bed to chair  Level of Assistance Minimal assist, patient does 75% or more  Assistive Device Four wheel walker  Distance Ambulated (ft) 5 ft  Activity Response Tolerated well  Mobility Referral Yes  $Mobility charge 1 Mobility   Pt was received in bed and agreeable to mobility. Upon getting EOB pt c/o dizziness, BP was 108/66 (76). Once dizziness subsided Pt was MinA for transfer to [facility]. Pt with successful void. Pt was then transferred to chair with all needs met and chair alarm set.   Franki Monte  Mobility Specialist Please contact via Solicitor or Rehab office at [phone]

## 2022-07-04 NOTE — Progress Notes (Signed)
Called report to [facility].  PTAR at bedside.

## 2022-07-04 NOTE — Progress Notes (Signed)
PT Cancellation Note  Patient Details Name: ANGELIS GATES MRN: [id number] DOB: 1940/08/14   Cancelled Treatment:    Reason Eval/Treat Not Completed: Patient declined, no reason specified. Meal tray arrived. Pt declining participation due to wanting to eat. Plan for d/c to SNF today.   Lorriane Shire 07/04/2022, 12:13 PM

## 2022-07-06 DIAGNOSIS — N179 Acute kidney failure, unspecified: Secondary | ICD-10-CM | POA: Diagnosis not present

## 2022-07-06 DIAGNOSIS — R1319 Other dysphagia: Secondary | ICD-10-CM | POA: Diagnosis not present

## 2022-07-06 DIAGNOSIS — J9611 Chronic respiratory failure with hypoxia: Secondary | ICD-10-CM | POA: Diagnosis not present

## 2022-07-06 DIAGNOSIS — J9621 Acute and chronic respiratory failure with hypoxia: Secondary | ICD-10-CM | POA: Diagnosis not present

## 2022-07-11 DIAGNOSIS — J690 Pneumonitis due to inhalation of food and vomit: Secondary | ICD-10-CM | POA: Diagnosis not present

## 2022-07-11 DIAGNOSIS — Z862 Personal history of diseases of the blood and blood-forming organs and certain disorders involving the immune mechanism: Secondary | ICD-10-CM | POA: Diagnosis not present

## 2022-07-17 DIAGNOSIS — E782 Mixed hyperlipidemia: Secondary | ICD-10-CM | POA: Diagnosis not present

## 2022-07-17 DIAGNOSIS — E1140 Type 2 diabetes mellitus with diabetic neuropathy, unspecified: Secondary | ICD-10-CM | POA: Diagnosis not present

## 2022-07-18 ENCOUNTER — Other Ambulatory Visit (HOSPITAL_COMMUNITY): Payer: Self-pay

## 2022-07-18 DIAGNOSIS — R1313 Dysphagia, pharyngeal phase: Secondary | ICD-10-CM | POA: Diagnosis not present

## 2022-07-18 DIAGNOSIS — R1310 Dysphagia, unspecified: Secondary | ICD-10-CM

## 2022-07-18 DIAGNOSIS — J690 Pneumonitis due to inhalation of food and vomit: Secondary | ICD-10-CM | POA: Diagnosis not present

## 2022-07-18 DIAGNOSIS — Z862 Personal history of diseases of the blood and blood-forming organs and certain disorders involving the immune mechanism: Secondary | ICD-10-CM | POA: Diagnosis not present

## 2022-07-28 ENCOUNTER — Ambulatory Visit (HOSPITAL_COMMUNITY)
Admission: RE | Admit: 2022-07-28 | Discharge: 2022-07-28 | Disposition: A | Discharge status: Home / Self Care | Payer: PPO | Source: Ambulatory Visit | Attending: Family Medicine | Admitting: Family Medicine

## 2022-07-28 DIAGNOSIS — R1312 Dysphagia, oropharyngeal phase: Secondary | ICD-10-CM | POA: Diagnosis not present

## 2022-07-28 DIAGNOSIS — E119 Type 2 diabetes mellitus without complications: Secondary | ICD-10-CM | POA: Diagnosis not present

## 2022-07-28 DIAGNOSIS — Z9049 Acquired absence of other specified parts of digestive tract: Secondary | ICD-10-CM | POA: Insufficient documentation

## 2022-07-28 DIAGNOSIS — R1310 Dysphagia, unspecified: Secondary | ICD-10-CM | POA: Diagnosis not present

## 2022-07-28 NOTE — Progress Notes (Signed)
   Modified Barium Swallow Study  Patient Details  Name: Nicole Montgomery MRN: [id number] Date of Birth: [birth year]  Today's Date: 07/28/2022  Modified Barium Swallow completed.  Full report located under Chart Review in the Imaging Section.  History of Present Illness Pt was seen for outpatient modified barium swallow study to assess her readiness for diet advancement. Pt was admitted to [facility] in January secondary to flu and DKA. She was intubated 1/3-1/11 and dysphagia was noted thereafter. MBS 1/15/: moderate oropharyngeal dysphagia marked by delayed propulsion, delayed swallow initation, reduced tongue base retraction, decreased laryngeal elevation and closure silent aspiration with thin liquid and larger sips of nectar thick liquids. A dysphagia 1 diet with honey thick liquids was initiated at that time. She was advanced to dysphagia 3 and nectar thick liquids on 1/18 and was subsequently discharged to SNF. Per the pt, she was on dysphagia 1 and nectar thick liquids at the SNF and treatment focused on use of compensatory strategies only. Marland Kitchen PMH: Type 2 DM on Ozempic, previous pancreatic adenocarcinoma, status post pancreatectomy, trigeminal Neuralgia, HL.   Clinical Impression Pt's overall swallow function is notably improved compared to that observed during the modified barium swallow study on 06/30/22. She now presents with oropharyngeal dysphagia characterized by slowed lingual movement, impairment in the timing of swallowing initiation, and reduction in laryngeal elevation and tongue base retraction. Trace vallecular residue was noted and reduced duration of PES distention resulted in residue at this level which improved with a liquid wash. The swallow was often triggered with the head of the bolus (thin and nectar thick) at the laryngeal surface of the epiglottis. This resulted in transient penetration (PAS 2) of thin and nectar thick liquids. This was especially consistent when consecutive  swallows were used, but this level of laryngeal invasion is considered WNL and no instances of aspiration were noted. SLP recommends that pt's diet be advanced up to regular texture solids and thin liquids with observance of swallowing precautions. Pt's spouse reported that the pt is currently receiving SLP services via home health and these may be continued.  Factors that may increase risk of adverse event in presence of aspiration Nicole Montgomery & Nicole Montgomery 2021):   N/A  Swallow Evaluation Recommendations Recommendations: PO diet PO Diet Recommendation: Regular;Thin liquids (Level 0) Liquid Administration via: Cup;Straw Medication Administration: Whole meds with liquid Supervision: Patient able to self-feed Swallowing strategies  : Slow rate;Small bites/sips;Follow solids with liquids Postural changes: Stay upright 30-60 min after meals Oral care recommendations: Oral care BID (2x/day)   Nicole Montgomery I. Nicole Montgomery, [place], [place] Office number [phone]    Nicole Montgomery 07/28/2022,4:05 PM

## 2022-07-29 DIAGNOSIS — E1140 Type 2 diabetes mellitus with diabetic neuropathy, unspecified: Secondary | ICD-10-CM | POA: Diagnosis not present

## 2022-07-29 DIAGNOSIS — N390 Urinary tract infection, site not specified: Secondary | ICD-10-CM | POA: Diagnosis not present

## 2022-07-29 DIAGNOSIS — N1831 Chronic kidney disease, stage 3a: Secondary | ICD-10-CM | POA: Diagnosis not present

## 2022-07-29 DIAGNOSIS — Z6822 Body mass index (BMI) 22.0-22.9, adult: Secondary | ICD-10-CM | POA: Diagnosis not present

## 2022-07-29 DIAGNOSIS — J159 Unspecified bacterial pneumonia: Secondary | ICD-10-CM | POA: Diagnosis not present

## 2022-07-31 DIAGNOSIS — Z9181 History of falling: Secondary | ICD-10-CM | POA: Diagnosis not present

## 2022-07-31 DIAGNOSIS — J9601 Acute respiratory failure with hypoxia: Secondary | ICD-10-CM | POA: Diagnosis not present

## 2022-07-31 DIAGNOSIS — D649 Anemia, unspecified: Secondary | ICD-10-CM | POA: Diagnosis not present

## 2022-07-31 DIAGNOSIS — Z7985 Long-term (current) use of injectable non-insulin antidiabetic drugs: Secondary | ICD-10-CM | POA: Diagnosis not present

## 2022-07-31 DIAGNOSIS — D75839 Thrombocytosis, unspecified: Secondary | ICD-10-CM | POA: Diagnosis not present

## 2022-07-31 DIAGNOSIS — E119 Type 2 diabetes mellitus without complications: Secondary | ICD-10-CM | POA: Diagnosis not present

## 2022-07-31 DIAGNOSIS — I119 Hypertensive heart disease without heart failure: Secondary | ICD-10-CM | POA: Diagnosis not present

## 2022-07-31 DIAGNOSIS — J9612 Chronic respiratory failure with hypercapnia: Secondary | ICD-10-CM | POA: Diagnosis not present

## 2022-07-31 DIAGNOSIS — E440 Moderate protein-calorie malnutrition: Secondary | ICD-10-CM | POA: Diagnosis not present

## 2022-07-31 DIAGNOSIS — R1312 Dysphagia, oropharyngeal phase: Secondary | ICD-10-CM | POA: Diagnosis not present

## 2022-07-31 DIAGNOSIS — Z8701 Personal history of pneumonia (recurrent): Secondary | ICD-10-CM | POA: Diagnosis not present

## 2022-07-31 DIAGNOSIS — D869 Sarcoidosis, unspecified: Secondary | ICD-10-CM | POA: Diagnosis not present

## 2022-07-31 DIAGNOSIS — Z8507 Personal history of malignant neoplasm of pancreas: Secondary | ICD-10-CM | POA: Diagnosis not present

## 2022-07-31 DIAGNOSIS — Z7982 Long term (current) use of aspirin: Secondary | ICD-10-CM | POA: Diagnosis not present

## 2022-07-31 DIAGNOSIS — Z87442 Personal history of urinary calculi: Secondary | ICD-10-CM | POA: Diagnosis not present

## 2022-07-31 DIAGNOSIS — G500 Trigeminal neuralgia: Secondary | ICD-10-CM | POA: Diagnosis not present

## 2022-07-31 DIAGNOSIS — Z794 Long term (current) use of insulin: Secondary | ICD-10-CM | POA: Diagnosis not present

## 2022-08-05 DIAGNOSIS — Z7982 Long term (current) use of aspirin: Secondary | ICD-10-CM | POA: Diagnosis not present

## 2022-08-05 DIAGNOSIS — Z8701 Personal history of pneumonia (recurrent): Secondary | ICD-10-CM | POA: Diagnosis not present

## 2022-08-05 DIAGNOSIS — D649 Anemia, unspecified: Secondary | ICD-10-CM | POA: Diagnosis not present

## 2022-08-05 DIAGNOSIS — D75839 Thrombocytosis, unspecified: Secondary | ICD-10-CM | POA: Diagnosis not present

## 2022-08-05 DIAGNOSIS — Z7985 Long-term (current) use of injectable non-insulin antidiabetic drugs: Secondary | ICD-10-CM | POA: Diagnosis not present

## 2022-08-05 DIAGNOSIS — D869 Sarcoidosis, unspecified: Secondary | ICD-10-CM | POA: Diagnosis not present

## 2022-08-05 DIAGNOSIS — E119 Type 2 diabetes mellitus without complications: Secondary | ICD-10-CM | POA: Diagnosis not present

## 2022-08-05 DIAGNOSIS — I119 Hypertensive heart disease without heart failure: Secondary | ICD-10-CM | POA: Diagnosis not present

## 2022-08-05 DIAGNOSIS — G500 Trigeminal neuralgia: Secondary | ICD-10-CM | POA: Diagnosis not present

## 2022-08-05 DIAGNOSIS — J9601 Acute respiratory failure with hypoxia: Secondary | ICD-10-CM | POA: Diagnosis not present

## 2022-08-05 DIAGNOSIS — R1312 Dysphagia, oropharyngeal phase: Secondary | ICD-10-CM | POA: Diagnosis not present

## 2022-08-05 DIAGNOSIS — Z87442 Personal history of urinary calculi: Secondary | ICD-10-CM | POA: Diagnosis not present

## 2022-08-05 DIAGNOSIS — Z794 Long term (current) use of insulin: Secondary | ICD-10-CM | POA: Diagnosis not present

## 2022-08-05 DIAGNOSIS — E440 Moderate protein-calorie malnutrition: Secondary | ICD-10-CM | POA: Diagnosis not present

## 2022-08-05 DIAGNOSIS — J9612 Chronic respiratory failure with hypercapnia: Secondary | ICD-10-CM | POA: Diagnosis not present

## 2022-08-05 DIAGNOSIS — Z9181 History of falling: Secondary | ICD-10-CM | POA: Diagnosis not present

## 2022-08-05 DIAGNOSIS — Z8507 Personal history of malignant neoplasm of pancreas: Secondary | ICD-10-CM | POA: Diagnosis not present

## 2022-08-08 ENCOUNTER — Institutional Professional Consult (permissible substitution): Payer: PPO | Admitting: Internal Medicine

## 2022-08-15 DIAGNOSIS — Z7985 Long-term (current) use of injectable non-insulin antidiabetic drugs: Secondary | ICD-10-CM | POA: Diagnosis not present

## 2022-08-15 DIAGNOSIS — E440 Moderate protein-calorie malnutrition: Secondary | ICD-10-CM | POA: Diagnosis not present

## 2022-08-15 DIAGNOSIS — Z87442 Personal history of urinary calculi: Secondary | ICD-10-CM | POA: Diagnosis not present

## 2022-08-15 DIAGNOSIS — J9612 Chronic respiratory failure with hypercapnia: Secondary | ICD-10-CM | POA: Diagnosis not present

## 2022-08-15 DIAGNOSIS — R1312 Dysphagia, oropharyngeal phase: Secondary | ICD-10-CM | POA: Diagnosis not present

## 2022-08-15 DIAGNOSIS — E119 Type 2 diabetes mellitus without complications: Secondary | ICD-10-CM | POA: Diagnosis not present

## 2022-08-15 DIAGNOSIS — Z8507 Personal history of malignant neoplasm of pancreas: Secondary | ICD-10-CM | POA: Diagnosis not present

## 2022-08-15 DIAGNOSIS — D649 Anemia, unspecified: Secondary | ICD-10-CM | POA: Diagnosis not present

## 2022-08-15 DIAGNOSIS — E782 Mixed hyperlipidemia: Secondary | ICD-10-CM | POA: Diagnosis not present

## 2022-08-15 DIAGNOSIS — Z8701 Personal history of pneumonia (recurrent): Secondary | ICD-10-CM | POA: Diagnosis not present

## 2022-08-15 DIAGNOSIS — G500 Trigeminal neuralgia: Secondary | ICD-10-CM | POA: Diagnosis not present

## 2022-08-15 DIAGNOSIS — D75839 Thrombocytosis, unspecified: Secondary | ICD-10-CM | POA: Diagnosis not present

## 2022-08-15 DIAGNOSIS — E1140 Type 2 diabetes mellitus with diabetic neuropathy, unspecified: Secondary | ICD-10-CM | POA: Diagnosis not present

## 2022-08-15 DIAGNOSIS — J9601 Acute respiratory failure with hypoxia: Secondary | ICD-10-CM | POA: Diagnosis not present

## 2022-08-15 DIAGNOSIS — Z7982 Long term (current) use of aspirin: Secondary | ICD-10-CM | POA: Diagnosis not present

## 2022-08-15 DIAGNOSIS — Z9181 History of falling: Secondary | ICD-10-CM | POA: Diagnosis not present

## 2022-08-15 DIAGNOSIS — I119 Hypertensive heart disease without heart failure: Secondary | ICD-10-CM | POA: Diagnosis not present

## 2022-08-15 DIAGNOSIS — D869 Sarcoidosis, unspecified: Secondary | ICD-10-CM | POA: Diagnosis not present

## 2022-08-15 DIAGNOSIS — Z794 Long term (current) use of insulin: Secondary | ICD-10-CM | POA: Diagnosis not present

## 2022-09-04 ENCOUNTER — Institutional Professional Consult (permissible substitution): Payer: PPO | Admitting: Internal Medicine

## 2022-09-04 NOTE — Progress Notes (Deleted)
   Nicole Montgomery, female    DOB: 1940-10-22   MRN: [id number]   Brief patient profile:  57  *** referred to pulmonary clinic 09/04/2022 by *** for ***        History of Present Illness  09/04/2022  Pulmonary/ 1st office eval/Dian Minahan  No chief complaint on file.    Dyspnea:  *** Cough: *** Sleep: *** SABA use:   Past Medical History:  Diagnosis Date   DM (diabetes mellitus) ([place])    s/p pancreatectomy   H/O splenectomy 2012   HTN (hypertension)    Nephrolithiasis    Pancreatic cancer ([place]) 2012   Sarcoid 2012    Outpatient Medications Prior to Visit  Medication Sig Dispense Refill   acetaminophen (TYLENOL) 325 MG tablet Take 2 tablets (650 mg total) by mouth every 4 (four) hours as needed for fever, moderate pain, mild pain or headache.     albuterol (PROVENTIL) (2.5 MG/3ML) 0.083% nebulizer solution Take 3 mLs (2.5 mg total) by nebulization every 4 (four) hours as needed for wheezing or shortness of breath. 75 mL 12   amitriptyline (ELAVIL) 50 MG tablet Take 1 tablet (50 mg total) by mouth at bedtime. 30 tablet 0   DULoxetine (CYMBALTA) 60 MG capsule Take 60 mg by mouth daily.     gabapentin (NEURONTIN) 300 MG capsule Take 1 capsule (300 mg total) by mouth 2 (two) times daily.     insulin aspart (NOVOLOG) 100 UNIT/ML injection Inject 0-9 Units into the skin 3 (three) times daily with meals. Use following sliding scale: CBG < 70: Implement Hypoglycemia Standing Orders and refer to Hypoglycemia Standing Orders sidebar report CBG 70 - 120: 0 units CBG 121 - 150: 1 unit CBG 151 - 200: 2 units CBG 201 - 250: 3 units CBG 251 - 300: 5 units CBG 301 - 350: 7 units CBG 351 - 400: 9 units CBG > 400: call MD and obtain STAT lab verification 10 mL 11   insulin detemir (LEVEMIR) 100 UNIT/ML injection Inject 0.08 mLs (8 Units total) into the skin daily. 10 mL 11   Multiple Vitamin (MULTIVITAMIN WITH MINERALS) TABS tablet Take 1 tablet by mouth daily.     ondansetron (ZOFRAN) 4 MG tablet Take 4 mg  by mouth every 8 (eight) hours as needed for nausea or vomiting.     phenol (CHLORASEPTIC) 1.4 % LIQD Use as directed 1 spray in the mouth or throat as needed for throat irritation / pain.  0   polyethylene glycol powder (GLYCOLAX/MIRALAX) powder Take 17 g by mouth daily as needed for mild constipation.     pravastatin (PRAVACHOL) 80 MG tablet Take 80 mg by mouth daily.  0   No facility-administered medications prior to visit.     Objective:     There were no vitals taken for this visit.         Assessment   No problem-specific Assessment & Plan notes found for this encounter.     Christinia Gully, MD 09/04/2022

## 2022-09-15 DIAGNOSIS — E1140 Type 2 diabetes mellitus with diabetic neuropathy, unspecified: Secondary | ICD-10-CM | POA: Diagnosis not present

## 2022-09-15 DIAGNOSIS — E782 Mixed hyperlipidemia: Secondary | ICD-10-CM | POA: Diagnosis not present

## 2022-10-14 DIAGNOSIS — E1140 Type 2 diabetes mellitus with diabetic neuropathy, unspecified: Secondary | ICD-10-CM | POA: Diagnosis not present

## 2022-10-14 DIAGNOSIS — I129 Hypertensive chronic kidney disease with stage 1 through stage 4 chronic kidney disease, or unspecified chronic kidney disease: Secondary | ICD-10-CM | POA: Diagnosis not present

## 2022-10-15 DIAGNOSIS — E1140 Type 2 diabetes mellitus with diabetic neuropathy, unspecified: Secondary | ICD-10-CM | POA: Diagnosis not present

## 2022-10-15 DIAGNOSIS — E782 Mixed hyperlipidemia: Secondary | ICD-10-CM | POA: Diagnosis not present

## 2022-10-29 DIAGNOSIS — I152 Hypertension secondary to endocrine disorders: Secondary | ICD-10-CM | POA: Diagnosis not present

## 2022-10-29 DIAGNOSIS — E1159 Type 2 diabetes mellitus with other circulatory complications: Secondary | ICD-10-CM | POA: Diagnosis not present

## 2022-10-29 DIAGNOSIS — E785 Hyperlipidemia, unspecified: Secondary | ICD-10-CM | POA: Diagnosis not present

## 2022-10-29 DIAGNOSIS — Z6822 Body mass index (BMI) 22.0-22.9, adult: Secondary | ICD-10-CM | POA: Diagnosis not present

## 2022-10-29 DIAGNOSIS — E1140 Type 2 diabetes mellitus with diabetic neuropathy, unspecified: Secondary | ICD-10-CM | POA: Diagnosis not present

## 2022-10-29 DIAGNOSIS — Z713 Dietary counseling and surveillance: Secondary | ICD-10-CM | POA: Diagnosis not present

## 2022-11-04 DIAGNOSIS — I152 Hypertension secondary to endocrine disorders: Secondary | ICD-10-CM | POA: Diagnosis not present

## 2022-11-04 DIAGNOSIS — Z6822 Body mass index (BMI) 22.0-22.9, adult: Secondary | ICD-10-CM | POA: Diagnosis not present

## 2022-11-04 DIAGNOSIS — E1159 Type 2 diabetes mellitus with other circulatory complications: Secondary | ICD-10-CM | POA: Diagnosis not present

## 2022-11-04 DIAGNOSIS — I129 Hypertensive chronic kidney disease with stage 1 through stage 4 chronic kidney disease, or unspecified chronic kidney disease: Secondary | ICD-10-CM | POA: Diagnosis not present

## 2022-11-04 DIAGNOSIS — N183 Chronic kidney disease, stage 3 unspecified: Secondary | ICD-10-CM | POA: Diagnosis not present

## 2022-11-04 DIAGNOSIS — Z23 Encounter for immunization: Secondary | ICD-10-CM | POA: Diagnosis not present

## 2022-11-14 DIAGNOSIS — E1159 Type 2 diabetes mellitus with other circulatory complications: Secondary | ICD-10-CM | POA: Diagnosis not present

## 2022-11-14 DIAGNOSIS — I129 Hypertensive chronic kidney disease with stage 1 through stage 4 chronic kidney disease, or unspecified chronic kidney disease: Secondary | ICD-10-CM | POA: Diagnosis not present

## 2022-11-15 DIAGNOSIS — E782 Mixed hyperlipidemia: Secondary | ICD-10-CM | POA: Diagnosis not present

## 2022-11-15 DIAGNOSIS — E1159 Type 2 diabetes mellitus with other circulatory complications: Secondary | ICD-10-CM | POA: Diagnosis not present

## 2022-12-14 DIAGNOSIS — E1159 Type 2 diabetes mellitus with other circulatory complications: Secondary | ICD-10-CM | POA: Diagnosis not present

## 2022-12-14 DIAGNOSIS — I129 Hypertensive chronic kidney disease with stage 1 through stage 4 chronic kidney disease, or unspecified chronic kidney disease: Secondary | ICD-10-CM | POA: Diagnosis not present

## 2022-12-15 DIAGNOSIS — E1159 Type 2 diabetes mellitus with other circulatory complications: Secondary | ICD-10-CM | POA: Diagnosis not present

## 2022-12-15 DIAGNOSIS — E782 Mixed hyperlipidemia: Secondary | ICD-10-CM | POA: Diagnosis not present

## 2023-01-14 DIAGNOSIS — I129 Hypertensive chronic kidney disease with stage 1 through stage 4 chronic kidney disease, or unspecified chronic kidney disease: Secondary | ICD-10-CM | POA: Diagnosis not present

## 2023-01-14 DIAGNOSIS — E1159 Type 2 diabetes mellitus with other circulatory complications: Secondary | ICD-10-CM | POA: Diagnosis not present

## 2023-01-15 DIAGNOSIS — E782 Mixed hyperlipidemia: Secondary | ICD-10-CM | POA: Diagnosis not present

## 2023-01-15 DIAGNOSIS — E1159 Type 2 diabetes mellitus with other circulatory complications: Secondary | ICD-10-CM | POA: Diagnosis not present

## 2023-02-02 DIAGNOSIS — L570 Actinic keratosis: Secondary | ICD-10-CM | POA: Diagnosis not present

## 2023-02-02 DIAGNOSIS — C44729 Squamous cell carcinoma of skin of left lower limb, including hip: Secondary | ICD-10-CM | POA: Diagnosis not present

## 2023-02-14 DIAGNOSIS — I129 Hypertensive chronic kidney disease with stage 1 through stage 4 chronic kidney disease, or unspecified chronic kidney disease: Secondary | ICD-10-CM | POA: Diagnosis not present

## 2023-02-14 DIAGNOSIS — E1159 Type 2 diabetes mellitus with other circulatory complications: Secondary | ICD-10-CM | POA: Diagnosis not present

## 2023-02-15 DIAGNOSIS — E782 Mixed hyperlipidemia: Secondary | ICD-10-CM | POA: Diagnosis not present

## 2023-02-15 DIAGNOSIS — E1159 Type 2 diabetes mellitus with other circulatory complications: Secondary | ICD-10-CM | POA: Diagnosis not present

## 2023-02-18 DIAGNOSIS — C44729 Squamous cell carcinoma of skin of left lower limb, including hip: Secondary | ICD-10-CM | POA: Diagnosis not present

## 2023-03-16 DIAGNOSIS — I129 Hypertensive chronic kidney disease with stage 1 through stage 4 chronic kidney disease, or unspecified chronic kidney disease: Secondary | ICD-10-CM | POA: Diagnosis not present

## 2023-03-16 DIAGNOSIS — E1159 Type 2 diabetes mellitus with other circulatory complications: Secondary | ICD-10-CM | POA: Diagnosis not present

## 2023-03-17 DIAGNOSIS — E782 Mixed hyperlipidemia: Secondary | ICD-10-CM | POA: Diagnosis not present

## 2023-03-17 DIAGNOSIS — E1159 Type 2 diabetes mellitus with other circulatory complications: Secondary | ICD-10-CM | POA: Diagnosis not present

## 2023-03-30 ENCOUNTER — Telehealth: Payer: Self-pay | Admitting: Pharmacist

## 2023-03-30 NOTE — Progress Notes (Signed)
03/30/2023  Patient ID: Nicole Montgomery, female   DOB: [birth year], 82 y.o.   MRN: [id number]   Reason for referral: medication assistance  Referral source: Medication Assistance Re-enrollement for 2025 Referral medication(s): Ozempic Current insurance:HTA  Objective: Allergies  Allergen Reactions   Codeine Other (See Comments)    Pt states she gets really sweaty and light-headed, like she's going to pass out.    Medications Reviewed Today     Reviewed by Beecher Mcardle, [facility] (Pharmacist) on 03/30/23 at 1408  Med List Status: <None>   Medication Order Taking? Sig Documenting Provider Last Dose Status Informant  acetaminophen (TYLENOL) 325 MG tablet [id number] Yes Take 2 tablets (650 mg total) by mouth every 4 (four) hours as needed for fever, moderate pain, mild pain or headache. Osvaldo Shipper, MD Taking Active   albuterol (PROVENTIL) (2.5 MG/3ML) 0.083% nebulizer solution [id number] Yes Take 3 mLs (2.5 mg total) by nebulization every 4 (four) hours as needed for wheezing or shortness of breath. Osvaldo Shipper, MD Taking Active   amitriptyline (ELAVIL) 50 MG tablet [id number] Yes Take 1 tablet (50 mg total) by mouth at bedtime. Osvaldo Shipper, MD Taking Active   B-D ULTRAFINE III SHORT PEN 31G X 8 MM MISC [id number] Yes SMARTSIG:1 Pen Needle SUB-Q Daily [provider] Taking Active   DULoxetine (CYMBALTA) 60 MG capsule [id number] Yes Take 60 mg by mouth daily. [provider] Taking Active Spouse/Significant Other, Pharmacy Records  gabapentin (NEURONTIN) 600 MG tablet [id number] Yes Take 600 mg by mouth 4 (four) times daily. [provider] Taking Active   insulin aspart (NOVOLOG) 100 UNIT/ML injection [id number] Yes Inject 0-9 Units into the skin 3 (three) times daily with meals. Use following sliding scale: CBG < 70: Implement Hypoglycemia Standing Orders and refer to Hypoglycemia Standing Orders sidebar report CBG 70 - 120: 0 units CBG 121 - 150: 1 unit CBG  151 - 200: 2 units CBG 201 - 250: 3 units CBG 251 - 300: 5 units CBG 301 - 350: 7 units CBG 351 - 400: 9 units CBG > 400: call MD and obtain STAT lab verification Osvaldo Shipper, MD Taking Active   insulin glargine (LANTUS) 100 UNIT/ML injection [id number] Yes Inject 5 Units into the skin at bedtime. [provider] Taking Active   Multiple Vitamin (MULTIVITAMIN WITH MINERALS) TABS tablet [id number] Yes Take 1 tablet by mouth daily. Osvaldo Shipper, MD Taking Active   ondansetron [facility]) 4 MG tablet [id number] Yes Take 4 mg by mouth every 8 (eight) hours as needed for nausea or vomiting. [provider] Taking Active Spouse/Significant Other, Pharmacy Records  phenol (CHLORASEPTIC) 1.4 % LIQD [id number] Yes Use as directed 1 spray in the mouth or throat as needed for throat irritation / pain. Osvaldo Shipper, MD Taking Active   polyethylene glycol powder [facility]) powder [id number] Yes Take 17 g by mouth daily as needed for mild constipation. [provider] Taking Active Spouse/Significant Other, Pharmacy Records  pravastatin (PRAVACHOL) 80 MG tablet [id number] Yes Take 80 mg by mouth daily. [provider] Taking Active Spouse/Significant Other, Pharmacy Records            Medication Assistance Findings:  Medication assistance needs identified: Spoke with Patient. HIPAA identifiers were obtained.  She confirmed she received Ozempic through patient assistance last year.  She appears to still qualify and said she would need assistance again next year.       Additional medication assistance options reviewed with patient as warranted:  No other  options identified  Plan: I will route patient assistance letter to [facility] pharmacy technician who will coordinate patient assistance program application process for medications listed above.  [facility] pharmacy technician will assist with obtaining all required documents from both patient and provider(s) and submit  application(s) once completed.    Beecher Mcardle, PharmD, BCACP [facility] Clinical Pharmacist [phone]

## 2023-04-01 DIAGNOSIS — E782 Mixed hyperlipidemia: Secondary | ICD-10-CM | POA: Diagnosis not present

## 2023-04-01 DIAGNOSIS — E1140 Type 2 diabetes mellitus with diabetic neuropathy, unspecified: Secondary | ICD-10-CM | POA: Diagnosis not present

## 2023-04-02 ENCOUNTER — Telehealth: Payer: Self-pay | Admitting: Pharmacy Technician

## 2023-04-02 DIAGNOSIS — Z5986 Financial insecurity: Secondary | ICD-10-CM

## 2023-04-02 NOTE — Progress Notes (Signed)
[facility] Customer service manager [facility])                                            [facility] Quality Pharmacy Team    04/02/2023  Nicole Montgomery 1941-05-25 [id number]                                      Medication Assistance Referral  Referral From: [facility] Katina B.  Medication/Company: Franki Monte / [employer] Patient application portion:  Mining engineer portion: Faxed  to Dr. Abner Greenspan Provider address/fax verified via: Office website  Pattricia Boss, CPhT [place]  Office: [phone] Fax: [phone] Email: [email or web address]

## 2023-04-13 DIAGNOSIS — N1831 Chronic kidney disease, stage 3a: Secondary | ICD-10-CM | POA: Diagnosis not present

## 2023-04-13 DIAGNOSIS — E1140 Type 2 diabetes mellitus with diabetic neuropathy, unspecified: Secondary | ICD-10-CM | POA: Diagnosis not present

## 2023-04-13 DIAGNOSIS — Z136 Encounter for screening for cardiovascular disorders: Secondary | ICD-10-CM | POA: Diagnosis not present

## 2023-04-13 DIAGNOSIS — I152 Hypertension secondary to endocrine disorders: Secondary | ICD-10-CM | POA: Diagnosis not present

## 2023-04-13 DIAGNOSIS — Z23 Encounter for immunization: Secondary | ICD-10-CM | POA: Diagnosis not present

## 2023-04-13 DIAGNOSIS — E669 Obesity, unspecified: Secondary | ICD-10-CM | POA: Diagnosis not present

## 2023-04-13 DIAGNOSIS — E782 Mixed hyperlipidemia: Secondary | ICD-10-CM | POA: Diagnosis not present

## 2023-04-13 DIAGNOSIS — Z0000 Encounter for general adult medical examination without abnormal findings: Secondary | ICD-10-CM | POA: Diagnosis not present

## 2023-04-13 DIAGNOSIS — Z1331 Encounter for screening for depression: Secondary | ICD-10-CM | POA: Diagnosis not present

## 2023-04-13 DIAGNOSIS — Z139 Encounter for screening, unspecified: Secondary | ICD-10-CM | POA: Diagnosis not present

## 2023-04-13 DIAGNOSIS — E1159 Type 2 diabetes mellitus with other circulatory complications: Secondary | ICD-10-CM | POA: Diagnosis not present

## 2023-04-13 DIAGNOSIS — Z1339 Encounter for screening examination for other mental health and behavioral disorders: Secondary | ICD-10-CM | POA: Diagnosis not present

## 2023-04-15 DIAGNOSIS — L578 Other skin changes due to chronic exposure to nonionizing radiation: Secondary | ICD-10-CM | POA: Diagnosis not present

## 2023-04-15 DIAGNOSIS — C44729 Squamous cell carcinoma of skin of left lower limb, including hip: Secondary | ICD-10-CM | POA: Diagnosis not present

## 2023-04-15 DIAGNOSIS — L570 Actinic keratosis: Secondary | ICD-10-CM | POA: Diagnosis not present

## 2023-04-17 DIAGNOSIS — E782 Mixed hyperlipidemia: Secondary | ICD-10-CM | POA: Diagnosis not present

## 2023-04-17 DIAGNOSIS — E1159 Type 2 diabetes mellitus with other circulatory complications: Secondary | ICD-10-CM | POA: Diagnosis not present

## 2023-04-28 DIAGNOSIS — C44729 Squamous cell carcinoma of skin of left lower limb, including hip: Secondary | ICD-10-CM | POA: Diagnosis not present

## 2023-04-30 DIAGNOSIS — Z6823 Body mass index (BMI) 23.0-23.9, adult: Secondary | ICD-10-CM | POA: Diagnosis not present

## 2023-04-30 DIAGNOSIS — R0781 Pleurodynia: Secondary | ICD-10-CM | POA: Diagnosis not present

## 2023-05-01 DIAGNOSIS — S2231XA Fracture of one rib, right side, initial encounter for closed fracture: Secondary | ICD-10-CM | POA: Diagnosis not present

## 2023-05-01 DIAGNOSIS — S2341XA Sprain of ribs, initial encounter: Secondary | ICD-10-CM | POA: Diagnosis not present

## 2023-05-04 ENCOUNTER — Encounter (HOSPITAL_COMMUNITY): Payer: Self-pay

## 2023-05-04 ENCOUNTER — Emergency Department (HOSPITAL_COMMUNITY): Payer: PPO

## 2023-05-04 ENCOUNTER — Inpatient Hospital Stay (HOSPITAL_COMMUNITY)
Admission: EM | Admit: 2023-05-04 | Discharge: 2023-05-06 | DRG: 184 | Disposition: A | Discharge status: Home / Self Care | Payer: PPO | Attending: General Surgery | Admitting: General Surgery

## 2023-05-04 ENCOUNTER — Other Ambulatory Visit: Payer: Self-pay

## 2023-05-04 DIAGNOSIS — R000 Tachycardia, unspecified: Secondary | ICD-10-CM | POA: Diagnosis not present

## 2023-05-04 DIAGNOSIS — J9811 Atelectasis: Secondary | ICD-10-CM | POA: Diagnosis not present

## 2023-05-04 DIAGNOSIS — M79601 Pain in right arm: Secondary | ICD-10-CM | POA: Diagnosis not present

## 2023-05-04 DIAGNOSIS — J90 Pleural effusion, not elsewhere classified: Secondary | ICD-10-CM | POA: Diagnosis not present

## 2023-05-04 DIAGNOSIS — Y92009 Unspecified place in unspecified non-institutional (private) residence as the place of occurrence of the external cause: Secondary | ICD-10-CM

## 2023-05-04 DIAGNOSIS — W01190A Fall on same level from slipping, tripping and stumbling with subsequent striking against furniture, initial encounter: Secondary | ICD-10-CM | POA: Diagnosis present

## 2023-05-04 DIAGNOSIS — I10 Essential (primary) hypertension: Secondary | ICD-10-CM | POA: Diagnosis present

## 2023-05-04 DIAGNOSIS — Z885 Allergy status to narcotic agent status: Secondary | ICD-10-CM | POA: Diagnosis not present

## 2023-05-04 DIAGNOSIS — Z883 Allergy status to other anti-infective agents status: Secondary | ICD-10-CM | POA: Diagnosis not present

## 2023-05-04 DIAGNOSIS — E785 Hyperlipidemia, unspecified: Secondary | ICD-10-CM | POA: Diagnosis present

## 2023-05-04 DIAGNOSIS — S2241XA Multiple fractures of ribs, right side, initial encounter for closed fracture: Principal | ICD-10-CM | POA: Diagnosis present

## 2023-05-04 DIAGNOSIS — Z888 Allergy status to other drugs, medicaments and biological substances status: Secondary | ICD-10-CM | POA: Diagnosis not present

## 2023-05-04 DIAGNOSIS — K8681 Exocrine pancreatic insufficiency: Secondary | ICD-10-CM | POA: Diagnosis not present

## 2023-05-04 DIAGNOSIS — Z9049 Acquired absence of other specified parts of digestive tract: Secondary | ICD-10-CM | POA: Diagnosis not present

## 2023-05-04 DIAGNOSIS — R918 Other nonspecific abnormal finding of lung field: Secondary | ICD-10-CM | POA: Diagnosis not present

## 2023-05-04 DIAGNOSIS — M19011 Primary osteoarthritis, right shoulder: Secondary | ICD-10-CM | POA: Diagnosis not present

## 2023-05-04 DIAGNOSIS — E139 Other specified diabetes mellitus without complications: Secondary | ICD-10-CM | POA: Diagnosis not present

## 2023-05-04 DIAGNOSIS — W19XXXA Unspecified fall, initial encounter: Principal | ICD-10-CM

## 2023-05-04 DIAGNOSIS — R079 Chest pain, unspecified: Secondary | ICD-10-CM | POA: Diagnosis not present

## 2023-05-04 DIAGNOSIS — E891 Postprocedural hypoinsulinemia: Secondary | ICD-10-CM | POA: Diagnosis present

## 2023-05-04 DIAGNOSIS — Z8507 Personal history of malignant neoplasm of pancreas: Secondary | ICD-10-CM | POA: Diagnosis not present

## 2023-05-04 DIAGNOSIS — Z90410 Acquired total absence of pancreas: Secondary | ICD-10-CM | POA: Diagnosis not present

## 2023-05-04 DIAGNOSIS — Z90710 Acquired absence of both cervix and uterus: Secondary | ICD-10-CM | POA: Diagnosis not present

## 2023-05-04 DIAGNOSIS — Z043 Encounter for examination and observation following other accident: Secondary | ICD-10-CM | POA: Diagnosis not present

## 2023-05-04 DIAGNOSIS — Z85828 Personal history of other malignant neoplasm of skin: Secondary | ICD-10-CM

## 2023-05-04 DIAGNOSIS — R0781 Pleurodynia: Secondary | ICD-10-CM | POA: Diagnosis not present

## 2023-05-04 DIAGNOSIS — E119 Type 2 diabetes mellitus without complications: Secondary | ICD-10-CM | POA: Diagnosis not present

## 2023-05-04 DIAGNOSIS — M79621 Pain in right upper arm: Secondary | ICD-10-CM | POA: Diagnosis not present

## 2023-05-04 DIAGNOSIS — M25521 Pain in right elbow: Secondary | ICD-10-CM | POA: Diagnosis not present

## 2023-05-04 DIAGNOSIS — T1490XA Injury, unspecified, initial encounter: Secondary | ICD-10-CM | POA: Diagnosis present

## 2023-05-04 DIAGNOSIS — S2249XA Multiple fractures of ribs, unspecified side, initial encounter for closed fracture: Secondary | ICD-10-CM | POA: Diagnosis not present

## 2023-05-04 DIAGNOSIS — Z9081 Acquired absence of spleen: Secondary | ICD-10-CM

## 2023-05-04 DIAGNOSIS — M7989 Other specified soft tissue disorders: Secondary | ICD-10-CM | POA: Diagnosis not present

## 2023-05-04 LAB — BASIC METABOLIC PANEL
Anion gap: 12 (ref 5–15)
BUN: 15 mg/dL (ref 8–23)
CO2: 21 mmol/L — ABNORMAL LOW (ref 22–32)
Calcium: 9.1 mg/dL (ref 8.9–10.3)
Chloride: 102 mmol/L (ref 98–111)
Creatinine, Ser: 0.98 mg/dL (ref 0.44–1.00)
GFR, Estimated: 58 mL/min — ABNORMAL LOW (ref >60)
Glucose, Bld: 179 mg/dL — ABNORMAL HIGH (ref 70–99)
Potassium: 4.4 mmol/L (ref 3.5–5.1)
Sodium: 135 mmol/L (ref 135–145)

## 2023-05-04 LAB — CBC WITH DIFFERENTIAL/PLATELET
Abs Immature Granulocytes: 0.05 10*3/uL (ref 0.00–0.07)
Basophils Absolute: 0.1 10*3/uL (ref 0.0–0.1)
Basophils Relative: 1 %
Eosinophils Absolute: 0.6 10*3/uL — ABNORMAL HIGH (ref 0.0–0.5)
Eosinophils Relative: 6 %
HCT: 44.5 % (ref 36.0–46.0)
Hemoglobin: 14 g/dL (ref 12.0–15.0)
Immature Granulocytes: 1 %
Lymphocytes Relative: 18 %
Lymphs Abs: 2 10*3/uL (ref 0.7–4.0)
MCH: 30.2 pg (ref 26.0–34.0)
MCHC: 31.5 g/dL (ref 30.0–36.0)
MCV: 95.9 fL (ref 80.0–100.0)
Monocytes Absolute: 1 10*3/uL (ref 0.1–1.0)
Monocytes Relative: 9 %
Neutro Abs: 7.2 10*3/uL (ref 1.7–7.7)
Neutrophils Relative %: 65 %
Platelets: 227 10*3/uL (ref 150–400)
RBC: 4.64 MIL/uL (ref 3.87–5.11)
RDW: 14.4 % (ref 11.5–15.5)
WBC: 10.9 10*3/uL — ABNORMAL HIGH (ref 4.0–10.5)
nRBC: 0.4 % — ABNORMAL HIGH (ref 0.0–0.2)

## 2023-05-04 LAB — CBC
HCT: 38 % (ref 36.0–46.0)
Hemoglobin: 12.1 g/dL (ref 12.0–15.0)
MCH: 29.8 pg (ref 26.0–34.0)
MCHC: 31.8 g/dL (ref 30.0–36.0)
MCV: 93.6 fL (ref 80.0–100.0)
Platelets: 352 10*3/uL (ref 150–400)
RBC: 4.06 MIL/uL (ref 3.87–5.11)
RDW: 14.3 % (ref 11.5–15.5)
WBC: 9 10*3/uL (ref 4.0–10.5)
nRBC: 0.2 % (ref 0.0–0.2)

## 2023-05-04 LAB — GLUCOSE, CAPILLARY: Glucose-Capillary: 222 mg/dL — ABNORMAL HIGH (ref 70–99)

## 2023-05-04 LAB — CREATININE, SERUM
Creatinine, Ser: 0.98 mg/dL (ref 0.44–1.00)
GFR, Estimated: 58 mL/min — ABNORMAL LOW (ref >60)

## 2023-05-04 LAB — HEMOGLOBIN A1C
Hgb A1c MFr Bld: 8.1 % — ABNORMAL HIGH (ref 4.8–5.6)
Mean Plasma Glucose: 185.77 mg/dL

## 2023-05-04 MED ORDER — INSULIN ASPART 100 UNIT/ML IJ SOLN
0.0000 [IU] | Freq: Three times a day (TID) | INTRAMUSCULAR | Status: DC
  Administered 2023-05-05: 3 [IU] via SUBCUTANEOUS
  Administered 2023-05-05: 2 [IU] via SUBCUTANEOUS
  Administered 2023-05-06: 1 [IU] via SUBCUTANEOUS

## 2023-05-04 MED ORDER — ASPIRIN 81 MG PO TBEC
81.0000 mg | DELAYED_RELEASE_TABLET | Freq: Every day | ORAL | Status: DC
  Administered 2023-05-04 – 2023-05-06 (×3): 81 mg via ORAL
  Filled 2023-05-04 (×3): qty 1

## 2023-05-04 MED ORDER — ONDANSETRON HCL 4 MG/2ML IJ SOLN
4.0000 mg | Freq: Four times a day (QID) | INTRAMUSCULAR | Status: DC | PRN
  Administered 2023-05-04: 4 mg via INTRAVENOUS
  Filled 2023-05-04: qty 2

## 2023-05-04 MED ORDER — DULOXETINE HCL 60 MG PO CPEP
60.0000 mg | ORAL_CAPSULE | Freq: Every day | ORAL | Status: DC
  Administered 2023-05-04 – 2023-05-06 (×3): 60 mg via ORAL
  Filled 2023-05-04 (×3): qty 1

## 2023-05-04 MED ORDER — GABAPENTIN 300 MG PO CAPS
600.0000 mg | ORAL_CAPSULE | Freq: Four times a day (QID) | ORAL | Status: DC
  Administered 2023-05-04 – 2023-05-06 (×6): 600 mg via ORAL
  Filled 2023-05-04 (×6): qty 2

## 2023-05-04 MED ORDER — KETOROLAC TROMETHAMINE 15 MG/ML IJ SOLN
15.0000 mg | Freq: Once | INTRAMUSCULAR | Status: AC
  Administered 2023-05-04: 15 mg via INTRAVENOUS
  Filled 2023-05-04: qty 1

## 2023-05-04 MED ORDER — OXYCODONE HCL 5 MG PO TABS
5.0000 mg | ORAL_TABLET | Freq: Once | ORAL | Status: AC
  Administered 2023-05-04: 5 mg via ORAL
  Filled 2023-05-04: qty 1

## 2023-05-04 MED ORDER — ENOXAPARIN SODIUM 30 MG/0.3ML IJ SOSY
30.0000 mg | PREFILLED_SYRINGE | Freq: Two times a day (BID) | INTRAMUSCULAR | Status: DC
  Administered 2023-05-05 – 2023-05-06 (×3): 30 mg via SUBCUTANEOUS
  Filled 2023-05-04 (×3): qty 0.3

## 2023-05-04 MED ORDER — METHOCARBAMOL 1000 MG/10ML IJ SOLN: 500.0000 mg | Freq: Three times a day (TID) | INTRAMUSCULAR | Status: DC

## 2023-05-04 MED ORDER — DOXYCYCLINE HYCLATE 100 MG PO TABS
100.0000 mg | ORAL_TABLET | Freq: Once | ORAL | Status: AC
  Administered 2023-05-04: 100 mg via ORAL
  Filled 2023-05-04: qty 1

## 2023-05-04 MED ORDER — ACETAMINOPHEN 325 MG PO TABS
650.0000 mg | ORAL_TABLET | Freq: Once | ORAL | Status: AC
  Administered 2023-05-04: 650 mg via ORAL
  Filled 2023-05-04: qty 2

## 2023-05-04 MED ORDER — HYDROMORPHONE HCL 1 MG/ML IJ SOLN: 0.5000 mg | INTRAMUSCULAR | Status: DC | PRN

## 2023-05-04 MED ORDER — ACETAMINOPHEN 500 MG PO TABS
1000.0000 mg | ORAL_TABLET | Freq: Four times a day (QID) | ORAL | Status: DC
  Administered 2023-05-04 – 2023-05-06 (×5): 1000 mg via ORAL
  Filled 2023-05-04 (×6): qty 2

## 2023-05-04 MED ORDER — INSULIN ASPART 100 UNIT/ML IJ SOLN
0.0000 [IU] | Freq: Every day | INTRAMUSCULAR | Status: DC
  Administered 2023-05-04: 2 [IU] via SUBCUTANEOUS

## 2023-05-04 MED ORDER — HYDRALAZINE HCL 20 MG/ML IJ SOLN: 10.0000 mg | INTRAMUSCULAR | Status: DC | PRN

## 2023-05-04 MED ORDER — INSULIN ASPART 100 UNIT/ML IJ SOLN
3.0000 [IU] | Freq: Three times a day (TID) | INTRAMUSCULAR | Status: DC
  Administered 2023-05-05: 3 [IU] via SUBCUTANEOUS

## 2023-05-04 MED ORDER — ONDANSETRON 4 MG PO TBDP: 4.0000 mg | ORAL_TABLET | Freq: Four times a day (QID) | ORAL | Status: DC | PRN

## 2023-05-04 MED ORDER — METOPROLOL TARTRATE 5 MG/5ML IV SOLN: 5.0000 mg | Freq: Four times a day (QID) | INTRAVENOUS | Status: DC | PRN

## 2023-05-04 MED ORDER — POLYETHYLENE GLYCOL 3350 17 G PO PACK
17.0000 g | PACK | Freq: Every day | ORAL | Status: DC | PRN
  Administered 2023-05-05: 17 g via ORAL
  Filled 2023-05-04: qty 1

## 2023-05-04 MED ORDER — MORPHINE SULFATE (PF) 2 MG/ML IV SOLN
2.0000 mg | Freq: Once | INTRAVENOUS | Status: AC
  Administered 2023-05-04: 2 mg via INTRAVENOUS
  Filled 2023-05-04: qty 1

## 2023-05-04 MED ORDER — HYDROCODONE-ACETAMINOPHEN 5-325 MG PO TABS
1.0000 | ORAL_TABLET | Freq: Four times a day (QID) | ORAL | Status: DC | PRN
  Administered 2023-05-05: 1 via ORAL
  Filled 2023-05-04: qty 1

## 2023-05-04 MED ORDER — IBUPROFEN 400 MG PO TABS
600.0000 mg | ORAL_TABLET | Freq: Once | ORAL | Status: AC
  Administered 2023-05-04: 600 mg via ORAL
  Filled 2023-05-04: qty 1

## 2023-05-04 MED ORDER — DOCUSATE SODIUM 100 MG PO CAPS
100.0000 mg | ORAL_CAPSULE | Freq: Two times a day (BID) | ORAL | Status: DC
  Administered 2023-05-04 – 2023-05-06 (×4): 100 mg via ORAL
  Filled 2023-05-04 (×4): qty 1

## 2023-05-04 MED ORDER — LACTATED RINGERS IV BOLUS
500.0000 mL | Freq: Once | INTRAVENOUS | Status: AC
  Administered 2023-05-04: 500 mL via INTRAVENOUS

## 2023-05-04 MED ORDER — METHOCARBAMOL 500 MG PO TABS
500.0000 mg | ORAL_TABLET | Freq: Three times a day (TID) | ORAL | Status: DC
Start: 2023-05-04 — End: 2023-05-06
  Administered 2023-05-04 – 2023-05-06 (×5): 500 mg via ORAL
  Filled 2023-05-04 (×5): qty 1

## 2023-05-04 NOTE — ED Notes (Signed)
ED TO INPATIENT HANDOFF REPORT  ED Nurse Name and Phone #: Rodney Booze [phone]  S Name/Age/Gender Nicole Montgomery 82 y.o. female Room/Bed: H018C/H018C  Code Status   Code Status: Full Code  Home/SNF/Other Home Patient oriented to: self, place, time, and situation Is this baseline? Yes   Triage Complete: Triage complete  Chief Complaint Trauma [T14.90XA]  Triage Note Patient seen at [facility] in the ER after a fall last Tuesday. Pain to R rib and RUE pain and was evaluated for same and discharged. Continues to have pain.    Allergies Allergies  Allergen Reactions   Codeine Other (See Comments)    Pt states she gets really sweaty and light-headed, like she's going to pass out.   Baclofen Swelling    Tingling and swelling in hands and feet   Carbamazepine     Other Reaction(s): Other (See Comments)  Liver dysfunction at high doses  ask  Liver dysfunction at high doses  Liver dysfunction at high doses, ask   Gabapentin     Other Reaction(s): Confusion, Other (See Comments)  Confusion and memory loss, lack of concentration with higher dose  ask  Confusion and memory loss, lack of concentration with higher dose  Confusion and memory loss, lack of concentration with higher dose, ask   Oxycodone-Acetaminophen Nausea And Vomiting    Other Reaction(s): GI Intolerance, Vomiting  Stomach burning   Pregabalin     Other Reaction(s): Other (See Comments)  Weight gain, fluid retention, trouble concentrating  ask  Weight gain, fluid retention, trouble concentrating  Weight gain, fluid retention, trouble concentrating, ask   Topiramate     Other Reaction(s): Confusion, Confusion (intolerance), Mental Status Changes    Level of Care/Admitting Diagnosis ED Disposition     ED Disposition  Admit   Condition  --   Comment  Hospital Area: [facility] [[id number]]  Level of Care: Med-Surg [16]  May admit patient to [facility] or [facility] if  equivalent level of care is available:: No  Covid Evaluation: Asymptomatic - no recent exposure (last 10 days) testing not required  Diagnosis: Trauma [[id number]]  Admitting Physician: TRAUMA MD [[id number]]  Attending Physician: TRAUMA MD [[id number]]  Certification:: I certify this patient will need inpatient services for at least 2 midnights  Estimated Length of Stay: 3          B Medical/Surgery History Past Medical History:  Diagnosis Date   DM (diabetes mellitus) (HCC)    s/p pancreatectomy   H/O splenectomy 2012   HTN (hypertension)    Nephrolithiasis    Pancreatic cancer (HCC) 2012   Sarcoid 2012   Past Surgical History:  Procedure Laterality Date   ABDOMINAL HYSTERECTOMY     APPENDECTOMY     CHOLECYSTECTOMY     PANCREATECTOMY  2012   PARTIAL COLECTOMY  2012   SPLENECTOMY  2012     A IV Location/Drains/Wounds Patient Lines/Drains/Airways Status     Active Line/Drains/Airways     Name Placement date Placement time Site Days   Pressure Injury 06/18/22 Wrist Distal;Left;Lower;Posterior Stage 1 -  Intact skin with non-blanchable redness of a localized area usually over a bony prominence. piunk, Open 06/18/22  1409  -- 320   Pressure Injury 06/18/22 Arm Anterior;Left;Lower Stage 3 -  Full thickness tissue loss. Subcutaneous fat may be visible but bone, tendon or muscle are NOT exposed. 06/18/22  1410  -- 320   Pressure Injury 06/18/22 Arm Anterior;Left;Lower Stage 3 -  Full thickness tissue  loss. Subcutaneous fat may be visible but bone, tendon or muscle are NOT exposed. 06/18/22  1410  -- 320   Pressure Injury 06/18/22 Face Right Stage 2 -  Partial thickness loss of dermis presenting as a shallow open injury with a red, pink wound bed without slough. Check, Pink 06/18/22  1411  -- 320            Intake/Output Last 24 hours No intake or output data in the 24 hours ending 05/04/23 2038  Labs/Imaging Results for orders placed or performed during the hospital encounter of  05/04/23 (from the past 48 hour(s))  CBC with Differential     Status: Abnormal   Collection Time: 05/04/23  2:25 PM  Result Value Ref Range   WBC 10.9 (H) 4.0 - 10.5 K/uL   RBC 4.64 3.87 - 5.11 MIL/uL   Hemoglobin 14.0 12.0 - 15.0 g/dL   HCT 40.9 81.1 - 91.4 %   MCV 95.9 80.0 - 100.0 fL   MCH 30.2 26.0 - 34.0 pg   MCHC 31.5 30.0 - 36.0 g/dL   RDW 78.2 95.6 - 21.3 %   Platelets 227 150 - 400 K/uL    Comment: REPEATED TO VERIFY   nRBC 0.4 (H) 0.0 - 0.2 %   Neutrophils Relative % 65 %   Neutro Abs 7.2 1.7 - 7.7 K/uL   Lymphocytes Relative 18 %   Lymphs Abs 2.0 0.7 - 4.0 K/uL   Monocytes Relative 9 %   Monocytes Absolute 1.0 0.1 - 1.0 K/uL   Eosinophils Relative 6 %   Eosinophils Absolute 0.6 (H) 0.0 - 0.5 K/uL   Basophils Relative 1 %   Basophils Absolute 0.1 0.0 - 0.1 K/uL   Immature Granulocytes 1 %   Abs Immature Granulocytes 0.05 0.00 - 0.07 K/uL    Comment: Performed at [facility], [address], [place], [place] [address]  Basic metabolic panel     Status: Abnormal   Collection Time: 05/04/23  2:34 PM  Result Value Ref Range   Sodium 135 135 - 145 mmol/L   Potassium 4.4 3.5 - 5.1 mmol/L   Chloride 102 98 - 111 mmol/L   CO2 21 (L) 22 - 32 mmol/L   Glucose, Bld 179 (H) 70 - 99 mg/dL    Comment: Glucose reference range applies only to samples taken after fasting for at least 8 hours.   BUN 15 8 - 23 mg/dL   Creatinine, Ser 8.46 0.44 - 1.00 mg/dL   Calcium 9.1 8.9 - 96.2 mg/dL   GFR, Estimated 58 (L) >60 mL/min    Comment: (NOTE) Calculated using the CKD-EPI Creatinine Equation (2021)    Anion gap 12 5 - 15    Comment: Performed at [facility], [address], [place], [place] [address]   CT Chest Wo Contrast  Result Date: 05/04/2023 CLINICAL DATA:  Right rib pain following a fall. EXAM: CT CHEST WITHOUT CONTRAST TECHNIQUE: Multidetector CT imaging of the chest was performed following the standard protocol without IV contrast. RADIATION DOSE  REDUCTION: This exam was performed according to the departmental dose-optimization program which includes automated exposure control, adjustment of the mA and/or kV according to patient size and/or use of iterative reconstruction technique. COMPARISON:  Chest CTA dated 10/08/2017 and portable chest dated 06/27/2022. FINDINGS: Cardiovascular: Atheromatous calcifications, including the coronary arteries and aorta. Borderline enlarged heart. No pericardial effusion. Mediastinum/Nodes: No significant change in multiple enlarged mediastinal nodes. A right precarinal node has short  axis diameter of 1.4 cm on image number 45/3 on image number 62/3, unchanged. A subcarinal node is partially calcified and has a short axis diameter of 1.7 cm, unchanged. Two small subcentimeter right lobe thyroid nodules. These do not need imaging follow-up. Unremarkable trachea and esophagus. Lungs/Pleura: Small right pleural effusion. Interval bilateral upper lobe subsegmental atelectasis as well as cylindrical bronchiectasis with associated parenchymal opacities. Interval small amount of linear atelectasis in the right lower lobe. No pneumothorax. Upper Abdomen: Unremarkable Musculoskeletal: Interval right 3rd, 4th, 5th, 6, 7th and 8th rib fracture. Some of these are displaced. Mild thoracic and lower cervical spine degenerative changes. No interval compression deformities or spinal fracture lines. IMPRESSION: 1. Interval right 3rd, 4th, 5th, 6, 7th and 8th rib fractures. Some of these are displaced. 2. Small right pleural effusion. 3. Interval bilateral upper lobe subsegmental atelectasis as well as cylindrical bronchiectasis with associated parenchymal opacities. 4. Stable mediastinal adenopathy, likely reactive. 5. Calcific coronary artery and aortic atherosclerosis. Aortic Atherosclerosis (ICD10-I70.0). Electronically Signed   By: Beckie Salts M.D.   On: 05/04/2023 17:18   DG Humerus Right  Result Date: 05/04/2023 CLINICAL DATA:   Distal humerus pain and swelling after fall 6 days ago. EXAM: RIGHT HUMERUS - 2+ VIEW COMPARISON:  None Available. FINDINGS: There is diffuse decreased bone mineralization. No acute fracture is seen within the right humerus. Normal alignment at the shoulder and elbow. Normal alignment of the acromioclavicular joint with mild peripheral osteophytosis. IMPRESSION: 1. No acute fracture. 2. Mild acromioclavicular osteoarthritis. Electronically Signed   By: Neita Garnet M.D.   On: 05/04/2023 15:21   DG Elbow 2 Views Right  Result Date: 05/04/2023 CLINICAL DATA:  Fall 6 days ago striking right side on tall chest. Pain and swelling. EXAM: RIGHT ELBOW - 2 VIEW COMPARISON:  None Available. FINDINGS: Normal bone mineralization. No joint effusion. Joint spaces are preserved. No acute fracture is seen. No dislocation. IMPRESSION: Normal right elbow radiographs. Electronically Signed   By: Neita Garnet M.D.   On: 05/04/2023 15:20    Pending Labs Unresulted Labs (From admission, onward)     Start     Ordered   05/11/23 0500  Creatinine, serum  (enoxaparin (LOVENOX)    CrCl >/= 30 with major trauma, spinal cord injury, or selected orthopedic surgery)  Weekly,   R     Comments: while on enoxaparin therapy.    05/04/23 2023   05/05/23 0500  CBC  Daily,   R      05/04/23 2023   05/05/23 0500  Basic metabolic panel  Daily,   R      05/04/23 2023   05/04/23 2025  Hemoglobin A1c  Once,   R       Comments: To assess prior glycemic control    05/04/23 2025   05/04/23 2020  CBC  (enoxaparin (LOVENOX)    CrCl >/= 30 with major trauma, spinal cord injury, or selected orthopedic surgery)  Once,   R       Comments: Baseline for enoxaparin therapy IF NOT already drawn.  Notify MD if PLT < 100 K.    05/04/23 2023   05/04/23 2020  Creatinine, serum  (enoxaparin (LOVENOX)    CrCl >/= 30 with major trauma, spinal cord injury, or selected orthopedic surgery)  Once,   R       Comments: Baseline for enoxaparin therapy IF  NOT already drawn.    05/04/23 2023  Vitals/Pain Today's Vitals   05/04/23 1644 05/04/23 1821 05/04/23 1823 05/04/23 1914  BP:  138/83 138/83   Pulse:  100 100   Resp:   16   Temp:  97.9 F (36.6 C) 97.9 F (36.6 C)   TempSrc:  Oral Oral   SpO2:  93% 93%   Weight: 54.8 kg     Height: 5' (1.524 m)     PainSc:    10-Worst pain ever    Isolation Precautions No active isolations  Medications Medications  lactated ringers bolus 500 mL (has no administration in time range)  ketorolac (TORADOL) 15 MG/ML injection 15 mg (has no administration in time range)  morphine (PF) 2 MG/ML injection 2 mg (has no administration in time range)  acetaminophen (TYLENOL) tablet 1,000 mg (has no administration in time range)  methocarbamol (ROBAXIN) tablet 500 mg (has no administration in time range)    Or  methocarbamol (ROBAXIN) injection 500 mg (has no administration in time range)  docusate sodium (COLACE) capsule 100 mg (has no administration in time range)  polyethylene glycol (MIRALAX / GLYCOLAX) packet 17 g (has no administration in time range)  ondansetron (ZOFRAN-ODT) disintegrating tablet 4 mg (has no administration in time range)    Or  ondansetron (ZOFRAN) injection 4 mg (has no administration in time range)  metoprolol tartrate (LOPRESSOR) injection 5 mg (has no administration in time range)  hydrALAZINE (APRESOLINE) injection 10 mg (has no administration in time range)  enoxaparin (LOVENOX) injection 30 mg (has no administration in time range)  HYDROcodone-acetaminophen (NORCO/VICODIN) 5-325 MG per tablet 1 tablet (has no administration in time range)  HYDROmorphone (DILAUDID) injection 0.5 mg (has no administration in time range)  aspirin EC tablet 81 mg (has no administration in time range)  DULoxetine (CYMBALTA) DR capsule 60 mg (has no administration in time range)  gabapentin (NEURONTIN) tablet 600 mg (has no administration in time range)  insulin aspart  (novoLOG) injection 0-9 Units (has no administration in time range)  insulin aspart (novoLOG) injection 0-5 Units (has no administration in time range)  insulin aspart (novoLOG) injection 3 Units (has no administration in time range)  ibuprofen (ADVIL) tablet 600 mg (600 mg Oral Given 05/04/23 1405)  acetaminophen (TYLENOL) tablet 650 mg (650 mg Oral Given 05/04/23 1405)  oxyCODONE (Oxy IR/ROXICODONE) immediate release tablet 5 mg (5 mg Oral Given 05/04/23 1641)  doxycycline (VIBRA-TABS) tablet 100 mg (100 mg Oral Given 05/04/23 1914)    Mobility walks     Focused Assessments Cardiac Assessment Handoff:    Lab Results  Component Value Date   CKTOTAL 86 06/22/2022   CKMB 2.9 06/22/2022   No results found for: "DDIMER" Does the Patient currently have chest pain? No    R Recommendations: See Admitting Provider Note  Report given to:   Additional Notes:

## 2023-05-04 NOTE — ED Triage Notes (Signed)
Patient seen at [facility] in the ER after a fall last Tuesday. Pain to R rib and RUE pain and was evaluated for same and discharged. Continues to have pain.

## 2023-05-04 NOTE — H&P (Signed)
Nicole Montgomery 05/26/1941  [id number].    HPI:  82 y/o F w/ a hx of DM, HTN, and HLD who presented to the ED after she experienced a ground level fall 4 days ago. She reports that she tripped over a yoga mat and struck a Nurse, mental health. She denies a head strike or LOC. She initially presented to her PCP and was prescribed PRN oxycodone. This did not fully control her pain, and today the pain actually worsened prompting her to present to the ED today. CT chest shows right sided 3-8th rib fx.  XR of right arm negative for fracture.   On exam, patient resting comfortably. Endorsing right-sided chest and back pain.  Able to pull on IS.   ROS: Review of Systems  Constitutional: Negative.   HENT: Negative.    Eyes: Negative.   Respiratory: Negative.    Cardiovascular: Negative.   Gastrointestinal: Negative.   Genitourinary: Negative.   Musculoskeletal:  Positive for back pain.       Right rib pain  Skin: Negative.   Neurological: Negative.   Endo/Heme/Allergies: Negative.   Psychiatric/Behavioral: Negative.      History reviewed. No pertinent family history.  Past Medical History:  Diagnosis Date   DM (diabetes mellitus) (HCC)    s/p pancreatectomy   H/O splenectomy 2012   HTN (hypertension)    Nephrolithiasis    Pancreatic cancer (HCC) 2012   Sarcoid 2012    Past Surgical History:  Procedure Laterality Date   ABDOMINAL HYSTERECTOMY     APPENDECTOMY     CHOLECYSTECTOMY     PANCREATECTOMY  2012   PARTIAL COLECTOMY  2012   SPLENECTOMY  2012    Social History:  reports that she has never smoked. She has never used smokeless tobacco. No history on file for alcohol use and drug use.  Allergies:  Allergies  Allergen Reactions   Codeine Other (See Comments)    Pt states she gets really sweaty and light-headed, like she's going to pass out.   Baclofen Swelling    Tingling and swelling in hands and feet   Carbamazepine     Other Reaction(s): Other (See  Comments)  Liver dysfunction at high doses  ask  Liver dysfunction at high doses  Liver dysfunction at high doses, ask   Gabapentin     Other Reaction(s): Confusion, Other (See Comments)  Confusion and memory loss, lack of concentration with higher dose  ask  Confusion and memory loss, lack of concentration with higher dose  Confusion and memory loss, lack of concentration with higher dose, ask   Oxycodone-Acetaminophen Nausea And Vomiting    Other Reaction(s): GI Intolerance, Vomiting  Stomach burning   Pregabalin     Other Reaction(s): Other (See Comments)  Weight gain, fluid retention, trouble concentrating  ask  Weight gain, fluid retention, trouble concentrating  Weight gain, fluid retention, trouble concentrating, ask   Topiramate     Other Reaction(s): Confusion, Confusion (intolerance), Mental Status Changes    (Not in a hospital admission)   Physical Exam: Blood pressure 138/83, pulse 100, temperature 97.9 F (36.6 C), temperature source Oral, resp. rate 16, height 5' (1.524 m), weight 54.8 kg, SpO2 93%. Gen: elderly female resting in bed, NAD HEENT: atraumatic, no scalp laceration Resp: TTP along the right rib cage, equal chest rise CV: RRR Abd: soft, non-distended, non-tender to palpation, no rebound/guarding, no peritoneal signs Neuro: Moving all extremities  Extremities: Ecchymosis to the RUE without deformity or active bleeding,  LLE with bandage intact  Results for orders placed or performed during the hospital encounter of 05/04/23 (from the past 48 hour(s))  CBC with Differential     Status: Abnormal   Collection Time: 05/04/23  2:25 PM  Result Value Ref Range   WBC 10.9 (H) 4.0 - 10.5 K/uL   RBC 4.64 3.87 - 5.11 MIL/uL   Hemoglobin 14.0 12.0 - 15.0 g/dL   HCT 19.1 47.8 - 29.5 %   MCV 95.9 80.0 - 100.0 fL   MCH 30.2 26.0 - 34.0 pg   MCHC 31.5 30.0 - 36.0 g/dL   RDW 62.1 30.8 - 65.7 %   Platelets 227 150 - 400 K/uL    Comment: REPEATED TO  VERIFY   nRBC 0.4 (H) 0.0 - 0.2 %   Neutrophils Relative % 65 %   Neutro Abs 7.2 1.7 - 7.7 K/uL   Lymphocytes Relative 18 %   Lymphs Abs 2.0 0.7 - 4.0 K/uL   Monocytes Relative 9 %   Monocytes Absolute 1.0 0.1 - 1.0 K/uL   Eosinophils Relative 6 %   Eosinophils Absolute 0.6 (H) 0.0 - 0.5 K/uL   Basophils Relative 1 %   Basophils Absolute 0.1 0.0 - 0.1 K/uL   Immature Granulocytes 1 %   Abs Immature Granulocytes 0.05 0.00 - 0.07 K/uL    Comment: Performed at [facility], [address] [place], [place] [address]  Basic metabolic panel     Status: Abnormal   Collection Time: 05/04/23  2:34 PM  Result Value Ref Range   Sodium 135 135 - 145 mmol/L   Potassium 4.4 3.5 - 5.1 mmol/L   Chloride 102 98 - 111 mmol/L   CO2 21 (L) 22 - 32 mmol/L   Glucose, Bld 179 (H) 70 - 99 mg/dL    Comment: Glucose reference range applies only to samples taken after fasting for at least 8 hours.   BUN 15 8 - 23 mg/dL   Creatinine, Ser 2.95 0.44 - 1.00 mg/dL   Calcium 9.1 8.9 - 28.4 mg/dL   GFR, Estimated 58 (L) >60 mL/min    Comment: (NOTE) Calculated using the CKD-EPI Creatinine Equation (2021)    Anion gap 12 5 - 15    Comment: Performed at [facility], [address] [place], [place] [address]   CT Chest Wo Contrast  Result Date: 05/04/2023 CLINICAL DATA:  Right rib pain following a fall. EXAM: CT CHEST WITHOUT CONTRAST TECHNIQUE: Multidetector CT imaging of the chest was performed following the standard protocol without IV contrast. RADIATION DOSE REDUCTION: This exam was performed according to the departmental dose-optimization program which includes automated exposure control, adjustment of the mA and/or kV according to patient size and/or use of iterative reconstruction technique. COMPARISON:  Chest CTA dated 10/08/2017 and portable chest dated 06/27/2022. FINDINGS: Cardiovascular: Atheromatous calcifications, including the coronary arteries and aorta. Borderline enlarged heart. No  pericardial effusion. Mediastinum/Nodes: No significant change in multiple enlarged mediastinal nodes. A right precarinal node has short axis diameter of 1.4 cm on image number 45/3 on image number 62/3, unchanged. A subcarinal node is partially calcified and has a short axis diameter of 1.7 cm, unchanged. Two small subcentimeter right lobe thyroid nodules. These do not need imaging follow-up. Unremarkable trachea and esophagus. Lungs/Pleura: Small right pleural effusion. Interval bilateral upper lobe subsegmental atelectasis as well as cylindrical bronchiectasis with associated parenchymal opacities. Interval small amount of linear atelectasis in the right lower lobe. No pneumothorax. Upper Abdomen:  Unremarkable Musculoskeletal: Interval right 3rd, 4th, 5th, 6, 7th and 8th rib fracture. Some of these are displaced. Mild thoracic and lower cervical spine degenerative changes. No interval compression deformities or spinal fracture lines. IMPRESSION: 1. Interval right 3rd, 4th, 5th, 6, 7th and 8th rib fractures. Some of these are displaced. 2. Small right pleural effusion. 3. Interval bilateral upper lobe subsegmental atelectasis as well as cylindrical bronchiectasis with associated parenchymal opacities. 4. Stable mediastinal adenopathy, likely reactive. 5. Calcific coronary artery and aortic atherosclerosis. Aortic Atherosclerosis (ICD10-I70.0). Electronically Signed   By: Beckie Salts M.D.   On: 05/04/2023 17:18   DG Humerus Right  Result Date: 05/04/2023 CLINICAL DATA:  Distal humerus pain and swelling after fall 6 days ago. EXAM: RIGHT HUMERUS - 2+ VIEW COMPARISON:  None Available. FINDINGS: There is diffuse decreased bone mineralization. No acute fracture is seen within the right humerus. Normal alignment at the shoulder and elbow. Normal alignment of the acromioclavicular joint with mild peripheral osteophytosis. IMPRESSION: 1. No acute fracture. 2. Mild acromioclavicular osteoarthritis. Electronically  Signed   By: Neita Garnet M.D.   On: 05/04/2023 15:21   DG Elbow 2 Views Right  Result Date: 05/04/2023 CLINICAL DATA:  Fall 6 days ago striking right side on tall chest. Pain and swelling. EXAM: RIGHT ELBOW - 2 VIEW COMPARISON:  None Available. FINDINGS: Normal bone mineralization. No joint effusion. Joint spaces are preserved. No acute fracture is seen. No dislocation. IMPRESSION: Normal right elbow radiographs. Electronically Signed   By: Neita Garnet M.D.   On: 05/04/2023 15:20    Assessment/Plan 82 y/o F w/ a hx of DM, HTN, and HLD who presented to the ED after she experienced a ground level fall 4 days ago and has a CT showing 3-8th right-sided rib fractures.  Right 3-8th rib fx - pulm toilet, pain control, IS, PT/OT RUE Ecchymosis  - XRs negative DM (POA) - SSI Basal Cell Carcinoma s/p resection of LLE lesion (POA) - Bandage intact, compression stocking  FEN - regular VTE - lovenox ID - none Admit - med/surg  I reviewed last 24 h vitals and pain scores, last 24 h labs and trends, and last 24 h imaging results.  Tacy [facility] 05/04/2023, 8:16 PM Please see Amion for pager number during day hours 7:00am-4:30pm or 7:00am -11:30am on weekends

## 2023-05-04 NOTE — ED Provider Notes (Signed)
[place] [facility] DE[facility]MENT AT [facility] Provider Note   CSN: [id number] Arrival date & time: 05/04/23  1306     History Chief Complaint  Patient presents with   Chest Pain    HPI Nicole Montgomery is a 82 y.o. female presenting for right-sided thoracic pain. 82 year old female with a ground-level fall on Thursday.  Saw her PCP Friday for right sided rib pain.  Outpatient workup found to have a possible right-sided rib fracture.  Started on as needed oxycodone.  Family member states that she has been very delusional after starting the oxycodone 5 mg per dose.  However the patient states that when she is not delusional she is in severe pain. Supposed to see PCP today but came to the emergency department staff. Mhx: DM, CAP,HLD, HTN.  Patient's recorded medical, surgical, social, medication list and allergies were reviewed in the Snapshot window as part of the initial history.   Review of Systems   Review of Systems  Constitutional:  Negative for chills and fever.  HENT:  Negative for ear pain and sore throat.   Eyes:  Negative for pain and visual disturbance.  Respiratory:  Negative for cough and shortness of breath.   Cardiovascular:  Negative for chest pain and palpitations.  Gastrointestinal:  Negative for abdominal pain and vomiting.  Genitourinary:  Negative for dysuria and hematuria.  Musculoskeletal:  Negative for arthralgias and back pain.  Skin:  Negative for color change and rash.  Neurological:  Negative for seizures and syncope.  All other systems reviewed and are negative.   Physical Exam Updated Vital Signs BP (!) 148/79 (BP Location: Left Arm)   Pulse 90   Temp 98.5 F (36.9 C) (Oral)   Resp 18   Ht 5' (1.524 m)   Wt 54.8 kg   SpO2 94%   BMI 23.59 kg/m  Physical Exam Vitals and nursing note reviewed.  Constitutional:      General: She is not in acute distress.    Appearance: She is well-developed.  HENT:     Head: Normocephalic and  atraumatic.  Eyes:     Conjunctiva/sclera: Conjunctivae normal.  Cardiovascular:     Rate and Rhythm: Normal rate and regular rhythm.     Heart sounds: No murmur heard. Pulmonary:     Effort: Pulmonary effort is normal. No respiratory distress.     Breath sounds: Normal breath sounds.  Abdominal:     Palpations: Abdomen is soft.     Tenderness: There is no abdominal tenderness.  Musculoskeletal:        General: Tenderness (Substantial right-sided thoracic rib pain to palpation .) present. No swelling.     Cervical back: Neck supple.  Skin:    General: Skin is warm and dry.     Capillary Refill: Capillary refill takes less than 2 seconds.  Neurological:     Mental Status: She is alert.  Psychiatric:        Mood and Affect: Mood normal.      ED Course/ Medical Decision Making/ A&P    Procedures .Critical Care  Performed by: Glyn Ade, MD Authorized by: Glyn Ade, MD   Critical care provider statement:    Critical care time (minutes):  30   Critical care was necessary to treat or prevent imminent or life-threatening deterioration of the following conditions:  Trauma   Critical care was time spent personally by me on the following activities:  Development of treatment plan with patient or surrogate, discussions with  consultants, evaluation of patient's response to treatment, examination of patient, ordering and review of laboratory studies, ordering and review of radiographic studies, ordering and performing treatments and interventions, pulse oximetry, re-evaluation of patient's condition and review of old charts    Medications Ordered in ED Medications  acetaminophen (TYLENOL) tablet 1,000 mg (1,000 mg Oral Given 05/04/23 2110)  methocarbamol (ROBAXIN) tablet 500 mg (500 mg Oral Given 05/04/23 2109)    Or  methocarbamol (ROBAXIN) injection 500 mg ( Intravenous See Alternative 05/04/23 2109)  docusate sodium (COLACE) capsule 100 mg (100 mg Oral Given  05/04/23 2215)  polyethylene glycol (MIRALAX / GLYCOLAX) packet 17 g (has no administration in time range)  ondansetron (ZOFRAN-ODT) disintegrating tablet 4 mg ( Oral See Alternative 05/04/23 2102)    Or  ondansetron (ZOFRAN) injection 4 mg (4 mg Intravenous Given 05/04/23 2102)  metoprolol tartrate (LOPRESSOR) injection 5 mg (has no administration in time range)  hydrALAZINE (APRESOLINE) injection 10 mg (has no administration in time range)  enoxaparin (LOVENOX) injection 30 mg (has no administration in time range)  HYDROcodone-acetaminophen (NORCO/VICODIN) 5-325 MG per tablet 1 tablet (has no administration in time range)  HYDROmorphone (DILAUDID) injection 0.5 mg (has no administration in time range)  aspirin EC tablet 81 mg (81 mg Oral Given 05/04/23 2109)  DULoxetine (CYMBALTA) DR capsule 60 mg (60 mg Oral Given 05/04/23 2215)  gabapentin (NEURONTIN) capsule 600 mg (600 mg Oral Given 05/04/23 2215)  insulin aspart (novoLOG) injection 0-9 Units (has no administration in time range)  insulin aspart (novoLOG) injection 0-5 Units (2 Units Subcutaneous Given 05/04/23 2245)  insulin aspart (novoLOG) injection 3 Units (has no administration in time range)  ibuprofen (ADVIL) tablet 600 mg (600 mg Oral Given 05/04/23 1405)  acetaminophen (TYLENOL) tablet 650 mg (650 mg Oral Given 05/04/23 1405)  oxyCODONE (Oxy IR/ROXICODONE) immediate release tablet 5 mg (5 mg Oral Given 05/04/23 1641)  doxycycline (VIBRA-TABS) tablet 100 mg (100 mg Oral Given 05/04/23 1914)  lactated ringers bolus 500 mL (500 mLs Intravenous New Bag/Given 05/04/23 2139)  ketorolac (TORADOL) 15 MG/ML injection 15 mg (15 mg Intravenous Given 05/04/23 2107)  morphine (PF) 2 MG/ML injection 2 mg (2 mg Intravenous Given 05/04/23 2106)  Medical Decision Making:    Nicole Montgomery is a 82 y.o. female who presented to the ED today with a moderate mechanisma trauma, detailed above.    Additional history discussed with patient's  family/caregivers.  Patient placed on continuous vitals and telemetry monitoring while in ED which was reviewed periodically.   Given this mechanism of trauma, a full physical exam was performed. Notably, patient was HDS in NAD.   Reviewed and confirmed nursing documentation for past medical history, family history, social history.    Initial Assessment/Plan:   This is a patient presenting with a moderate mechanism trauma.  As such, I have considered intracranial injuries including intracranial hemorrhage, intrathoracic injuries including blunt myocardial or blunt lung injury, blunt abdominal injuries including aortic dissection, bladder injury, spleen injury, liver injury and I have considered orthopedic injuries including extremity or spinal injury.  With the patient's presentation of moderate mechanism trauma but an otherwise reassuring exam, patient warrants targeted evaluation for potential traumatic injuries. Will proceed with targeted evaluation for potential injuries. Will proceed with CT chest, right-sided humerus x-ray right sided elbow x-ray. Objective evaluation resulted with 6x rib fractures.   Final Reassessment and Plan:   Anticipated rib fracture identified.  No pneumothorax hemothorax. Unfortunately patient also has sequential rib fractures. Consulted medicine requested  trauma evaluation.  Trauma surgery to admit patient for pulmonary toileting, ongoing care management and stabilization.   Emergency Department Medication Summary:   Medications  acetaminophen (TYLENOL) tablet 1,000 mg (1,000 mg Oral Given 05/04/23 2110)  methocarbamol (ROBAXIN) tablet 500 mg (500 mg Oral Given 05/04/23 2109)    Or  methocarbamol (ROBAXIN) injection 500 mg ( Intravenous See Alternative 05/04/23 2109)  docusate sodium (COLACE) capsule 100 mg (100 mg Oral Given 05/04/23 2215)  polyethylene glycol (MIRALAX / GLYCOLAX) packet 17 g (has no administration in time range)  ondansetron (ZOFRAN-ODT)  disintegrating tablet 4 mg ( Oral See Alternative 05/04/23 2102)    Or  ondansetron (ZOFRAN) injection 4 mg (4 mg Intravenous Given 05/04/23 2102)  metoprolol tartrate (LOPRESSOR) injection 5 mg (has no administration in time range)  hydrALAZINE (APRESOLINE) injection 10 mg (has no administration in time range)  enoxaparin (LOVENOX) injection 30 mg (has no administration in time range)  HYDROcodone-acetaminophen (NORCO/VICODIN) 5-325 MG per tablet 1 tablet (has no administration in time range)  HYDROmorphone (DILAUDID) injection 0.5 mg (has no administration in time range)  aspirin EC tablet 81 mg (81 mg Oral Given 05/04/23 2109)  DULoxetine (CYMBALTA) DR capsule 60 mg (60 mg Oral Given 05/04/23 2215)  gabapentin (NEURONTIN) capsule 600 mg (600 mg Oral Given 05/04/23 2215)  insulin aspart (novoLOG) injection 0-9 Units (has no administration in time range)  insulin aspart (novoLOG) injection 0-5 Units (2 Units Subcutaneous Given 05/04/23 2245)  insulin aspart (novoLOG) injection 3 Units (has no administration in time range)  ibuprofen (ADVIL) tablet 600 mg (600 mg Oral Given 05/04/23 1405)  acetaminophen (TYLENOL) tablet 650 mg (650 mg Oral Given 05/04/23 1405)  oxyCODONE (Oxy IR/ROXICODONE) immediate release tablet 5 mg (5 mg Oral Given 05/04/23 1641)  doxycycline (VIBRA-TABS) tablet 100 mg (100 mg Oral Given 05/04/23 1914)  lactated ringers bolus 500 mL (500 mLs Intravenous New Bag/Given 05/04/23 2139)  ketorolac (TORADOL) 15 MG/ML injection 15 mg (15 mg Intravenous Given 05/04/23 2107)  morphine (PF) 2 MG/ML injection 2 mg (2 mg Intravenous Given 05/04/23 2106)           Clinical Impression:  1. Fall, initial encounter   2. Closed fracture of multiple ribs of right side, initial encounter      Admit   Final Clinical Impression(s) / ED Diagnoses Final diagnoses:  Fall, initial encounter  Closed fracture of multiple ribs of right side, initial encounter    Rx / DC Orders ED  Discharge Orders     None         Glyn Ade, MD 05/04/23 2324

## 2023-05-04 NOTE — ED Provider Triage Note (Signed)
Emergency Medicine Provider Triage Evaluation Note  Nicole Montgomery , a 82 y.o. female  was evaluated in triage.  She suffered a mechanical fall 6 days ago at home.  She tripped over a yoga mat and fell onto a tall chest striking her right side.  No head trauma or loss of consciousness.  Was seen by her PCP and was noted to have right-sided rib fracture.  Has been unable to manage her discomfort at home and feels as though she cannot take a deep breath.  She also notes pain and swelling over her right upper arm.  No imaging was taken of the arm.  No other injuries.  No anticoagulation  Review of Systems  Positive: Chest pain shortness of breath right arm pain Negative: Abdominal pain back pain neck pain  Physical Exam  BP 136/83 (BP Location: Left Arm)   Pulse (!) 101   Temp 97.7 F (36.5 C) (Oral)   Resp 16   SpO2 92%  Gen:   Awake, no distress.  Holding right chest Resp:  Normal effort.  Poor inspiratory effort secondary to pain. MSK:   Moves extremities without difficulty.  Tenderness and swelling over distal humerus with no bony tenderness to the elbow forearm hand or wrist Other:  Without  Medical Decision Making  Medically screening exam initiated at 1:16 PM.  Appropriate orders placed.  LAJLA HILBUN was informed that the remainder of the evaluation will be completed by another provider, this initial triage assessment does not replace that evaluation, and the importance of remaining in the ED until their evaluation is complete.     Royanne Foots, DO 05/04/23 1322

## 2023-05-04 NOTE — ED Notes (Signed)
PT was educated on how to use the incentive spirometer and could only hold it at 750 for a sec before getting winded.

## 2023-05-04 NOTE — Plan of Care (Signed)
  Problem: Clinical Measurements: Goal: Ability to maintain clinical measurements within normal limits will improve Outcome: Progressing   

## 2023-05-05 LAB — BASIC METABOLIC PANEL
Anion gap: 9 (ref 5–15)
BUN: 22 mg/dL (ref 8–23)
CO2: 24 mmol/L (ref 22–32)
Calcium: 8.6 mg/dL — ABNORMAL LOW (ref 8.9–10.3)
Chloride: 104 mmol/L (ref 98–111)
Creatinine, Ser: 1.27 mg/dL — ABNORMAL HIGH (ref 0.44–1.00)
GFR, Estimated: 42 mL/min — ABNORMAL LOW (ref >60)
Glucose, Bld: 138 mg/dL — ABNORMAL HIGH (ref 70–99)
Potassium: 4.4 mmol/L (ref 3.5–5.1)
Sodium: 137 mmol/L (ref 135–145)

## 2023-05-05 LAB — GLUCOSE, CAPILLARY
Glucose-Capillary: 175 mg/dL — ABNORMAL HIGH (ref 70–99)
Glucose-Capillary: 107 mg/dL — ABNORMAL HIGH (ref 70–99)
Glucose-Capillary: 134 mg/dL — ABNORMAL HIGH (ref 70–99)
Glucose-Capillary: 202 mg/dL — ABNORMAL HIGH (ref 70–99)
Glucose-Capillary: 100 mg/dL — ABNORMAL HIGH (ref 70–99)

## 2023-05-05 LAB — CBC
HCT: 36.2 % (ref 36.0–46.0)
Hemoglobin: 11.3 g/dL — ABNORMAL LOW (ref 12.0–15.0)
MCH: 29.8 pg (ref 26.0–34.0)
MCHC: 31.2 g/dL (ref 30.0–36.0)
MCV: 95.5 fL (ref 80.0–100.0)
Platelets: 381 10*3/uL (ref 150–400)
RBC: 3.79 MIL/uL — ABNORMAL LOW (ref 3.87–5.11)
RDW: 14.4 % (ref 11.5–15.5)
WBC: 12 10*3/uL — ABNORMAL HIGH (ref 4.0–10.5)
nRBC: 0.2 % (ref 0.0–0.2)

## 2023-05-05 MED ORDER — POLYETHYLENE GLYCOL 3350 17 G PO PACK
17.0000 g | PACK | Freq: Every day | ORAL | Status: DC
  Administered 2023-05-05 – 2023-05-06 (×2): 17 g via ORAL
  Filled 2023-05-05 (×2): qty 1

## 2023-05-05 MED ORDER — PIOGLITAZONE HCL 30 MG PO TABS
45.0000 mg | ORAL_TABLET | Freq: Every day | ORAL | Status: DC
  Administered 2023-05-05 – 2023-05-06 (×2): 45 mg via ORAL
  Filled 2023-05-05 (×2): qty 1

## 2023-05-05 MED ORDER — AMITRIPTYLINE HCL 50 MG PO TABS
50.0000 mg | ORAL_TABLET | Freq: Every day | ORAL | Status: DC
  Administered 2023-05-05: 50 mg via ORAL
  Filled 2023-05-05: qty 1

## 2023-05-05 MED ORDER — OXYCODONE HCL 5 MG PO TABS
5.0000 mg | ORAL_TABLET | ORAL | Status: DC | PRN
  Administered 2023-05-05 – 2023-05-06 (×5): 5 mg via ORAL
  Filled 2023-05-05 (×5): qty 1

## 2023-05-05 MED ORDER — PRAVASTATIN SODIUM 40 MG PO TABS
80.0000 mg | ORAL_TABLET | Freq: Every day | ORAL | Status: DC
  Administered 2023-05-05 – 2023-05-06 (×2): 80 mg via ORAL
  Filled 2023-05-05 (×2): qty 2

## 2023-05-05 MED ORDER — INSULIN GLARGINE-YFGN 100 UNIT/ML ~~LOC~~ SOLN
5.0000 [IU] | Freq: Every day | SUBCUTANEOUS | Status: DC
  Administered 2023-05-05: 5 [IU] via SUBCUTANEOUS
  Filled 2023-05-05 (×2): qty 0.05

## 2023-05-05 MED ORDER — POLYETHYLENE GLYCOL 3350 17 GM/SCOOP PO POWD
17.0000 g | Freq: Every day | ORAL | Status: DC
  Filled 2023-05-05: qty 255

## 2023-05-05 NOTE — Progress Notes (Signed)
OT Screen Note  Patient Details Name: Nicole Montgomery MRN: [id number] DOB: July 27, 1940   Cancelled Treatment:    Reason Eval/Treat Not Completed: OT screened, no needs identified, will sign off. Pt verbalized using a figure 4 position to dress LB and getting into the bottom of the bath tub. Pt reports that she does not feel she needs OT at this time. OT explained the purpose of OT services and benefits. Pt verbalized she will be okay because she was doing it before. Pt states "I am going to finish my dinner" ( pt with lunch present)   Timmothy Euler, OTR/L  Acute Rehabilitation Services Office: [phone] .   Mateo Flow 05/05/2023, 12:00 PM

## 2023-05-05 NOTE — Plan of Care (Signed)
  Problem: Coping: Goal: Ability to adjust to condition or change in health will improve Outcome: Progressing   Problem: Metabolic: Goal: Ability to maintain appropriate glucose levels will improve Outcome: Progressing   Problem: Nutritional: Goal: Maintenance of adequate nutrition will improve Outcome: Progressing   Problem: Skin Integrity: Goal: Risk for impaired skin integrity will decrease Outcome: Progressing   Problem: Tissue Perfusion: Goal: Adequacy of tissue perfusion will improve Outcome: Progressing   Problem: Clinical Measurements: Goal: Will remain free from infection Outcome: Progressing Goal: Respiratory complications will improve Outcome: Progressing Goal: Cardiovascular complication will be avoided Outcome: Progressing   Problem: Activity: Goal: Risk for activity intolerance will decrease Outcome: Progressing   Problem: Nutrition: Goal: Adequate nutrition will be maintained Outcome: Progressing   Problem: Elimination: Goal: Will not experience complications related to bowel motility Outcome: Progressing   Problem: Pain Management: Goal: General experience of comfort will improve Outcome: Progressing   Problem: Safety: Goal: Ability to remain free from injury will improve Outcome: Progressing   Problem: Skin Integrity: Goal: Risk for impaired skin integrity will decrease Outcome: Progressing

## 2023-05-05 NOTE — Progress Notes (Signed)
Subjective: Feeling better with better pain control here and now that she isn't moving around much.  Ate breakfast well.  No other complaints  ROS: See above, otherwise other systems negative  Objective: Vital signs in last 24 hours: Temp:  [97.7 F (36.5 C)-98.5 F (36.9 C)] 98.5 F (36.9 C) (11/19 0650) Pulse Rate:  [78-101] 90 (11/19 0805) Resp:  [16-18] 16 (11/19 0805) BP: (104-148)/(62-85) 104/62 (11/19 0805) SpO2:  [90 %-94 %] 93 % (11/19 0805) Weight:  [54.8 kg] 54.8 kg (11/18 1644)    Intake/Output from previous day: 11/18 0701 - 11/19 0700 In: 665 [P.O.:240; IV Piggyback:425] Out: -  Intake/Output this shift: No intake/output data recorded.  PE: Gen: NAD HEENT: PERRL Heart: regular Lungs: CTAB, pulls 1250 on IS, right chest wall tender to palpation Abd: soft, NT Ext: MAE Psych: A&Ox3  Lab Results:  Recent Labs    05/04/23 1425 05/04/23 2205  WBC 10.9* 9.0  HGB 14.0 12.1  HCT 44.5 38.0  PLT 227 352   BMET Recent Labs    05/04/23 1434 05/04/23 2205  NA 135  --   K 4.4  --   CL 102  --   CO2 21*  --   GLUCOSE 179*  --   BUN 15  --   CREATININE 0.98 0.98  CALCIUM 9.1  --    PT/INR No results for input(s): "LABPROT", "INR" in the last 72 hours. CMP     Component Value Date/Time   NA 135 05/04/2023 1434   K 4.4 05/04/2023 1434   CL 102 05/04/2023 1434   CO2 21 (L) 05/04/2023 1434   GLUCOSE 179 (H) 05/04/2023 1434   BUN 15 05/04/2023 1434   CREATININE 0.98 05/04/2023 2205   CALCIUM 9.1 05/04/2023 1434   PROT 5.8 (L) 06/26/2022 0124   ALBUMIN <1.5 (L) 06/26/2022 0124   AST 25 06/26/2022 0124   ALT 19 06/26/2022 0124   ALKPHOS 189 (H) 06/26/2022 0124   BILITOT 0.5 06/26/2022 0124   GFRNONAA 58 (L) 05/04/2023 2205   GFRAA  10/23/2007 1248    >60        The eGFR has been calculated using the MDRD equation. This calculation has not been validated in all clinical   Lipase  No results found for:  "LIPASE"     Studies/Results: CT Chest Wo Contrast  Result Date: 05/04/2023 CLINICAL DATA:  Right rib pain following a fall. EXAM: CT CHEST WITHOUT CONTRAST TECHNIQUE: Multidetector CT imaging of the chest was performed following the standard protocol without IV contrast. RADIATION DOSE REDUCTION: This exam was performed according to the departmental dose-optimization program which includes automated exposure control, adjustment of the mA and/or kV according to patient size and/or use of iterative reconstruction technique. COMPARISON:  Chest CTA dated 10/08/2017 and portable chest dated 06/27/2022. FINDINGS: Cardiovascular: Atheromatous calcifications, including the coronary arteries and aorta. Borderline enlarged heart. No pericardial effusion. Mediastinum/Nodes: No significant change in multiple enlarged mediastinal nodes. A right precarinal node has short axis diameter of 1.4 cm on image number 45/3 on image number 62/3, unchanged. A subcarinal node is partially calcified and has a short axis diameter of 1.7 cm, unchanged. Two small subcentimeter right lobe thyroid nodules. These do not need imaging follow-up. Unremarkable trachea and esophagus. Lungs/Pleura: Small right pleural effusion. Interval bilateral upper lobe subsegmental atelectasis as well as cylindrical bronchiectasis with associated parenchymal opacities. Interval small amount of linear atelectasis in the right lower lobe. No pneumothorax. Upper Abdomen:  Unremarkable Musculoskeletal: Interval right 3rd, 4th, 5th, 6, 7th and 8th rib fracture. Some of these are displaced. Mild thoracic and lower cervical spine degenerative changes. No interval compression deformities or spinal fracture lines. IMPRESSION: 1. Interval right 3rd, 4th, 5th, 6, 7th and 8th rib fractures. Some of these are displaced. 2. Small right pleural effusion. 3. Interval bilateral upper lobe subsegmental atelectasis as well as cylindrical bronchiectasis with associated  parenchymal opacities. 4. Stable mediastinal adenopathy, likely reactive. 5. Calcific coronary artery and aortic atherosclerosis. Aortic Atherosclerosis (ICD10-I70.0). Electronically Signed   By: Beckie Salts M.D.   On: 05/04/2023 17:18   DG Humerus Right  Result Date: 05/04/2023 CLINICAL DATA:  Distal humerus pain and swelling after fall 6 days ago. EXAM: RIGHT HUMERUS - 2+ VIEW COMPARISON:  None Available. FINDINGS: There is diffuse decreased bone mineralization. No acute fracture is seen within the right humerus. Normal alignment at the shoulder and elbow. Normal alignment of the acromioclavicular joint with mild peripheral osteophytosis. IMPRESSION: 1. No acute fracture. 2. Mild acromioclavicular osteoarthritis. Electronically Signed   By: Neita Garnet M.D.   On: 05/04/2023 15:21   DG Elbow 2 Views Right  Result Date: 05/04/2023 CLINICAL DATA:  Fall 6 days ago striking right side on tall chest. Pain and swelling. EXAM: RIGHT ELBOW - 2 VIEW COMPARISON:  None Available. FINDINGS: Normal bone mineralization. No joint effusion. Joint spaces are preserved. No acute fracture is seen. No dislocation. IMPRESSION: Normal right elbow radiographs. Electronically Signed   By: Neita Garnet M.D.   On: 05/04/2023 15:20    Anti-infectives: Anti-infectives (From admission, onward)    Start     Dose/Rate Route Frequency Ordered Stop   05/04/23 1745  doxycycline (VIBRA-TABS) tablet 100 mg        100 mg Oral  Once 05/04/23 1740 05/04/23 1914        Assessment/Plan GLF Right 3-8th rib fx - pulm toilet, pain control, IS, PT/OT RUE Ecchymosis  - XRs negative DM (POA) - carb mod diet and resume home meds Basal Cell Carcinoma s/p resection of LLE lesion (POA) HTN - resume home meds HLD FEN - carb mod diet VTE - lovenox ID - none Dispo - therapies  I reviewed last 24 h vitals and pain scores, last 48 h intake and output, last 24 h labs and trends, and last 24 h imaging results.   LOS: 1 day     Letha Cape , [facility] Surgery 05/05/2023, 9:03 AM Please see Amion for pager number during day hours 7:00am-4:30pm or 7:00am -11:30am on weekends

## 2023-05-05 NOTE — Evaluation (Signed)
Physical Therapy Evaluation Patient Details Name: Nicole Montgomery MRN: [id number] DOB: 1941/04/18 Today's Date: 05/05/2023  History of Present Illness  82 yo female presents on 05/04/23 s/p fall (11/15) chest xray reveals R 3-8 rib fx PMH: DM, HTN, HLD, pancreatic CA 2012, splenectomy, basal cell carcinoma s/p resection on LLE lesion  Clinical Impression  Pt admitted with above diagnosis. Pt from home with husband, ambulates independently without AD. Has RUE skin tear, bruising, and swelling and 5/10 R rib pain though pt reports pain has decreased significantly from admission. Pt mobilizing in bed independently and safe with sit>stand. However, unsteady with dynamic standing and ambulation without AD. Pt ambulated 150' with RW and supervision. When RW was taken away she had immediate increased stagger step and LOB with min A to correct. Recommend RW for home, no further PT needs at d/c.  Pt currently with functional limitations due to the deficits listed below (see PT Problem List). Pt will benefit from acute skilled PT to increase their independence and safety with mobility to allow discharge.           If plan is discharge home, recommend the following: Assistance with cooking/housework;Assist for transportation   Can travel by private vehicle        Equipment Recommendations Rolling walker (2 wheels)  Recommendations for Other Services       Functional Status Assessment Patient has had a recent decline in their functional status and demonstrates the ability to make significant improvements in function in a reasonable and predictable amount of time.     Precautions / Restrictions Precautions Precautions: Fall Restrictions Weight Bearing Restrictions: No      Mobility  Bed Mobility Overal bed mobility: Needs Assistance Bed Mobility: Rolling, Sidelying to Sit Rolling: Supervision Sidelying to sit: Supervision       General bed mobility comments: vc's to avoid twisting for  pain control, no physical assist needed    Transfers Overall transfer level: Needs assistance Equipment used: Rolling walker (2 wheels), None Transfers: Sit to/from Stand Sit to Stand: Contact guard assist           General transfer comment: no LOB with sit>stand    Ambulation/Gait Ambulation/Gait assistance: Min assist, Contact guard assist Gait Distance (Feet): 150 Feet Assistive device: Rolling walker (2 wheels), None Gait Pattern/deviations: Step-through pattern, Staggering left, Staggering right Gait velocity: WFL with AD Gait velocity interpretation: >2.62 ft/sec, indicative of community ambulatory   General Gait Details: pt steady with ambulation with RW, then took RW away and pt without significant increased in pain but with immediate LOB, min A to correct, increased sway and stagger step. Discussed fall risk right now and recommend that pt use RW at least next 2 weeks  Stairs            Wheelchair Mobility     Tilt Bed    Modified Rankin (Stroke Patients Only)       Balance Overall balance assessment: History of Falls, Needs assistance Sitting-balance support: No upper extremity supported, Feet supported Sitting balance-Leahy Scale: Good     Standing balance support: No upper extremity supported, During functional activity Standing balance-Leahy Scale: Poor Standing balance comment: unsteady with dynamic standing without UE support                             Pertinent Vitals/Pain Pain Assessment Pain Assessment: 0-10 Pain Score: 5  Pain Location: R chest Pain Descriptors / Indicators: Aching, Sore  Pain Intervention(s): Limited activity within patient's tolerance, Monitored during session    Home Living Family/patient expects to be discharged to:: Private residence Living Arrangements: Spouse/significant other Available Help at Discharge: Family;Available 24 hours/day Type of Home: House Home Access: Stairs to enter Entrance  Stairs-Rails: Doctor, general practice of Steps: 5   Home Layout: One level Home Equipment: Cane - single point Additional Comments: pt unable to provide home setup or PLOF and no family present    Prior Function Prior Level of Function : Independent/Modified Independent;Driving;History of Falls (last six months)             Mobility Comments: was ambulating with independence, some assist after catching the flu ADLs Comments: was independent, IADLs, managing meds, driving     Extremity/Trunk Assessment   Upper Extremity Assessment Upper Extremity Assessment: Overall WFL for tasks assessed;RUE deficits/detail RUE Deficits / Details: skin tear, bruise, and swelling R upper arm    Lower Extremity Assessment Lower Extremity Assessment: Overall WFL for tasks assessed    Cervical / Trunk Assessment Cervical / Trunk Assessment: Normal  Communication   Communication Communication: Hearing impairment Cueing Techniques: Verbal cues;Gestural cues  Cognition Arousal: Alert Behavior During Therapy: WFL for tasks assessed/performed Overall Cognitive Status: Within Functional Limits for tasks assessed                                          General Comments General comments (skin integrity, edema, etc.): VSS, reviewed use of IS    Exercises     Assessment/Plan    PT Assessment Patient needs continued PT services  PT Problem List Decreased balance;Decreased mobility;Decreased knowledge of use of DME;Decreased safety awareness;Pain;Decreased skin integrity       PT Treatment Interventions DME instruction;Gait training;Stair training;Functional mobility training;Therapeutic activities;Therapeutic exercise;Balance training;Patient/family education    PT Goals (Current goals can be found in the Care Plan section)  Acute Rehab PT Goals Patient Stated Goal: return home PT Goal Formulation: With patient Time For Goal Achievement: 05/19/23 Potential to  Achieve Goals: Good    Frequency Min 1X/week     Co-evaluation               AM-PAC PT "6 Clicks" Mobility  Outcome Measure Help needed turning from your back to your side while in a flat bed without using bedrails?: None Help needed moving from lying on your back to sitting on the side of a flat bed without using bedrails?: None Help needed moving to and from a bed to a chair (including a wheelchair)?: None Help needed standing up from a chair using your arms (e.g., wheelchair or bedside chair)?: A Little Help needed to walk in hospital room?: A Little Help needed climbing 3-5 steps with a railing? : A Little 6 Click Score: 21    End of Session Equipment Utilized During Treatment: Gait belt Activity Tolerance: Patient tolerated treatment well Patient left: in chair;with call bell/phone within reach;with chair alarm set Nurse Communication: Mobility status PT Visit Diagnosis: Unsteadiness on feet (R26.81);Pain;Difficulty in walking, not elsewhere classified (R26.2) Pain - Right/Left: Right Pain - part of body:  (chest)    Time: 9147-8295 PT Time Calculation (min) (ACUTE ONLY): 29 min   Charges:   PT Evaluation $PT Eval Moderate Complexity: 1 Mod PT Treatments $Gait Training: 8-22 mins PT General Charges $$ ACUTE PT VISIT: 1 Visit  Lyanne Co, PT  Acute Rehab Services Secure chat preferred Office [phone]   Elyse Hsu 05/05/2023, 12:23 PM

## 2023-05-06 LAB — BASIC METABOLIC PANEL
Anion gap: 7 (ref 5–15)
BUN: 25 mg/dL — ABNORMAL HIGH (ref 8–23)
CO2: 23 mmol/L (ref 22–32)
Calcium: 8.6 mg/dL — ABNORMAL LOW (ref 8.9–10.3)
Chloride: 107 mmol/L (ref 98–111)
Creatinine, Ser: 1.07 mg/dL — ABNORMAL HIGH (ref 0.44–1.00)
GFR, Estimated: 52 mL/min — ABNORMAL LOW (ref >60)
Glucose, Bld: 156 mg/dL — ABNORMAL HIGH (ref 70–99)
Potassium: 4.5 mmol/L (ref 3.5–5.1)
Sodium: 137 mmol/L (ref 135–145)

## 2023-05-06 LAB — GLUCOSE, CAPILLARY
Glucose-Capillary: 137 mg/dL — ABNORMAL HIGH (ref 70–99)
Glucose-Capillary: 132 mg/dL — ABNORMAL HIGH (ref 70–99)

## 2023-05-06 LAB — CBC
HCT: 37.1 % (ref 36.0–46.0)
Hemoglobin: 11.7 g/dL — ABNORMAL LOW (ref 12.0–15.0)
MCH: 30 pg (ref 26.0–34.0)
MCHC: 31.5 g/dL (ref 30.0–36.0)
MCV: 95.1 fL (ref 80.0–100.0)
Platelets: 386 10*3/uL (ref 150–400)
RBC: 3.9 MIL/uL (ref 3.87–5.11)
RDW: 14.6 % (ref 11.5–15.5)
WBC: 10.5 10*3/uL (ref 4.0–10.5)
nRBC: 0.2 % (ref 0.0–0.2)

## 2023-05-06 MED ORDER — METHOCARBAMOL 500 MG PO TABS: 500.0000 mg | ORAL_TABLET | Freq: Three times a day (TID) | ORAL | 0 refills | Status: DC | PRN

## 2023-05-06 MED ORDER — OXYCODONE HCL 5 MG PO TABS: 5.0000 mg | ORAL_TABLET | Freq: Four times a day (QID) | ORAL | 0 refills | Status: DC | PRN

## 2023-05-06 MED ORDER — ACETAMINOPHEN 500 MG PO TABS: 1000.0000 mg | ORAL_TABLET | Freq: Four times a day (QID) | ORAL | Status: AC | PRN

## 2023-05-06 NOTE — TOC CAGE-AID Note (Signed)
Transition of Care [facility]) - CAGE-AID Screening   Patient Details  Name: Nicole Montgomery MRN: [id number] Date of Birth: 1940-12-12  Transition of Care [facility]) CM/SW Contact:    Janora Norlander, RN Phone Number: [phone] 05/06/2023, 9:21 AM   Clinical Narrative: Pt in the hospital due to a GLF after tripping over her yoga mat, striking a bookshelf.  Pt sustained R rib fractures.  Pt denies alcohol or drug use and is to be discharged today.  Screening complete.   CAGE-AID Screening:    Have You Ever Felt You Ought to Cut Down on Your Drinking or Drug Use?: No Have People Annoyed You By Critizing Your Drinking Or Drug Use?: No Have You Felt Bad Or Guilty About Your Drinking Or Drug Use?: No Have You Ever Had a Drink or Used Drugs First Thing In The Morning to Steady Your Nerves or to Get Rid of a Hangover?: No CAGE-AID Score: 0  Substance Abuse Education Offered: No

## 2023-05-06 NOTE — Discharge Summary (Signed)
Patient ID: Nicole Montgomery [id number] 1941-06-16 82 y.o.  Admit date: 05/04/2023 Discharge date: 05/06/2023  Admitting Diagnosis: Fall  R 3-8 rib fxs DM HTN HLD  Discharge Diagnosis Patient Active Problem List   Diagnosis Date Noted   Trauma 05/04/2023   Pneumonia of right lung due to infectious organism 06/22/2022   Malnutrition of moderate degree 06/19/2022   On mechanically assisted ventilation (HCC) 06/18/2022   Pressure injury of skin 06/18/2022   DKA (diabetic ketoacidosis) (HCC) 06/17/2022   Influenza A 06/17/2022   Acute respiratory failure with hypoxia (HCC) 06/17/2022   Pain in left foot 01/28/2017   Plantar fasciitis, left 01/28/2017   Essential hypertension 04/12/2015   Gastroesophageal reflux disease without esophagitis 04/12/2015   Hyperlipidemia, unspecified 04/12/2015   Postherpetic trigeminal neuralgia 01/13/2013  Fall  R 3-8 rib fxs DM HTN HLD  Consultants none  Reason for Admission: 82 y/o F w/ a hx of DM, HTN, and HLD who presented to the ED after she experienced a ground level fall 4 days ago. She reports that she tripped over a yoga mat and struck a Nurse, mental health. She denies a head strike or LOC. She initially presented to her PCP and was prescribed PRN oxycodone. This did not fully control her pain, and today the pain actually worsened prompting her to present to the ED today. CT chest shows right sided 3-8th rib fx.  XR of right arm negative for fracture.    On exam, patient resting comfortably. Endorsing right-sided chest and back pain.  Able to pull on IS.   Procedures none  Hospital Course:  The patient was admitted and worked with therapies with no following recommendations except for a rolling walker which was arranged.  She had adequate pain control with oral medications.  She was stable on HD 2 for DC home.  She will follow up with her PCP on a PRN  basis  Physical Exam: Gen: NAD Heart: regular Lungs: CTAB Abd: soft, NT Ext:  MAEs Psych: A&O x3  Allergies as of 05/06/2023       Reactions   Codeine Other (See Comments)   Pt states she gets really sweaty and light-headed, like she's going to pass out.   Baclofen Swelling   Tingling and swelling in hands and feet   Carbamazepine    Other Reaction(s): Other (See Comments) Liver dysfunction at high doses  ask Liver dysfunction at high doses Liver dysfunction at high doses, ask   Gabapentin    Other Reaction(s): Confusion, Other (See Comments) Confusion and memory loss, lack of concentration with higher dose  ask Confusion and memory loss, lack of concentration with higher dose Confusion and memory loss, lack of concentration with higher dose, ask   Oxycodone-acetaminophen Nausea And Vomiting   Other Reaction(s): GI Intolerance, Vomiting Stomach burning   Pregabalin    Other Reaction(s): Other (See Comments) Weight gain, fluid retention, trouble concentrating  ask Weight gain, fluid retention, trouble concentrating Weight gain, fluid retention, trouble concentrating, ask   Topiramate    Other Reaction(s): Confusion, Confusion (intolerance), Mental Status Changes        Medication List     STOP taking these medications    oxyCODONE-acetaminophen 5-325 MG tablet Commonly known as: PERCOCET/ROXICET   traMADol 50 MG tablet Commonly known as: ULTRAM       TAKE these medications    acetaminophen 500 MG tablet Commonly known as: TYLENOL Take 2 tablets (1,000 mg total) by mouth every 6 (six) hours as  needed. What changed:  medication strength how much to take when to take this reasons to take this   amitriptyline 50 MG tablet Commonly known as: ELAVIL Take 1 tablet (50 mg total) by mouth at bedtime.   aspirin EC 81 MG tablet Take 81 mg by mouth daily. Swallow whole.   B-D ULTRAFINE III SHORT PEN 31G X 8 MM Misc Generic drug: Insulin Pen Needle SMARTSIG:1 Pen Needle SUB-Q Daily   DULoxetine 60 MG capsule Commonly known as:  CYMBALTA Take 60 mg by mouth daily.   fluconazole 100 MG tablet Commonly known as: DIFLUCAN Take 100 mg by mouth daily as needed.   fluorouracil 5 % cream Commonly known as: EFUDEX Apply 1 Application topically 2 (two) times daily.   gabapentin 300 MG capsule Commonly known as: NEURONTIN Take 300 mg by mouth 4 (four) times daily. Take with 600mg  Gabapentin   gabapentin 600 MG tablet Commonly known as: NEURONTIN Take 600 mg by mouth 4 (four) times daily. Take with 300mg  Gabapentin   insulin glargine 100 UNIT/ML injection Commonly known as: LANTUS Inject 5 Units into the skin at bedtime.   Melatonin Gummies 5 MG Chew Chew 1 each by mouth at bedtime.   methocarbamol 500 MG tablet Commonly known as: ROBAXIN Take 1 tablet (500 mg total) by mouth every 8 (eight) hours as needed for muscle spasms.   multivitamin with minerals Tabs tablet Take 1 tablet by mouth daily.   ondansetron 4 MG tablet Commonly known as: ZOFRAN Take 4 mg by mouth every 8 (eight) hours as needed for nausea or vomiting.   oxyCODONE 5 MG immediate release tablet Commonly known as: Oxy IR/ROXICODONE Take 1 tablet (5 mg total) by mouth every 6 (six) hours as needed (pain).   Ozempic (0.25 or 0.5 MG/DOSE) 2 MG/1.5ML Sopn Generic drug: Semaglutide(0.25 or 0.5MG /DOS) Inject 0.5 mg into the skin once a week. On Tuesdays   pioglitazone 45 MG tablet Commonly known as: ACTOS Take 45 mg by mouth daily.   polyethylene glycol powder 17 GM/SCOOP powder Commonly known as: GLYCOLAX/MIRALAX Take 17 g by mouth daily.   pravastatin 80 MG tablet Commonly known as: PRAVACHOL Take 80 mg by mouth daily.   sulfamethoxazole-trimethoprim 800-160 MG tablet Commonly known as: BACTRIM DS Take 1 tablet by mouth 2 (two) times daily.               Durable Medical Equipment  (From admission, onward)           Start     Ordered   05/05/23 1226  For home use only DME Walker rolling  Once       Question Answer  Comment  Walker: With 5 Inch Wheels   Patient needs a walker to treat with the following condition Imbalance      05/05/23 1226              Follow-up Information     Abner Greenspan, MD Follow up.   Specialty: Family Medicine Why: As needed Contact information: [address] [place] [address] [phone]                 Signed: Barnetta Chapel, [facility] Surgery 05/06/2023, 8:01 AM Please see Amion for pager number during day hours 7:00am-4:30pm, 7-11:30am on Weekends

## 2023-05-06 NOTE — Progress Notes (Signed)
Pt discharged to home. Dc instructions given with husband at bedside. No concerns verbalized. Pt with severe pain at discharge. Medicated prior to leaving unit. Left unit in wheelchair pushed by hospital volunteer accompanied by husband. Left in stable condition. Unbeknownst to this Clinical research associate, pt had DME ordered for use at home. Equipment delivered to unit after pt had left unit.  Trauma TOC RN Orpha Bur made aware. Katy plan to collaborate with another team member in determination of next intervention.

## 2023-05-07 DIAGNOSIS — L8990 Pressure ulcer of unspecified site, unspecified stage: Secondary | ICD-10-CM | POA: Diagnosis not present

## 2023-05-07 DIAGNOSIS — J189 Pneumonia, unspecified organism: Secondary | ICD-10-CM | POA: Diagnosis not present

## 2023-05-07 DIAGNOSIS — J9601 Acute respiratory failure with hypoxia: Secondary | ICD-10-CM | POA: Diagnosis not present

## 2023-05-07 DIAGNOSIS — M79672 Pain in left foot: Secondary | ICD-10-CM | POA: Diagnosis not present

## 2023-05-08 ENCOUNTER — Other Ambulatory Visit: Payer: Self-pay | Admitting: Pharmacy Technician

## 2023-05-08 DIAGNOSIS — Z5986 Financial insecurity: Secondary | ICD-10-CM

## 2023-05-08 NOTE — Progress Notes (Signed)
Pharmacy Medication Assistance Program Note    05/08/2023  Patient ID: Nicole Montgomery, female   DOB: 1941-05-01, 82 y.o.   MRN: [id number]     05/08/2023  Outreach Medication One  Manufacturer Medication One [employer] [facility] Ozempic  Dose of Ozempic 4mg /59ml  Type of Radiographer, therapeutic Assistance  Date Application Received From Patient 04/29/2023  Application Items Received From Patient Application;Proof of Income;Other  Date Application Received From Provider 04/13/2023  Date Application Submitted to Manufacturer 05/07/2023  Method Application Sent to Manufacturer Fax       Signature  Kristopher Glee [facility]  Office: [phone] Fax: [phone] Email: [name]

## 2023-05-17 DIAGNOSIS — E782 Mixed hyperlipidemia: Secondary | ICD-10-CM | POA: Diagnosis not present

## 2023-05-17 DIAGNOSIS — E1159 Type 2 diabetes mellitus with other circulatory complications: Secondary | ICD-10-CM | POA: Diagnosis not present

## 2023-06-15 ENCOUNTER — Telehealth: Payer: Self-pay | Admitting: Pharmacy Technician

## 2023-06-15 DIAGNOSIS — Z5986 Financial insecurity: Secondary | ICD-10-CM

## 2023-06-15 NOTE — Progress Notes (Signed)
Pharmacy Medication Assistance Program Note    06/15/2023  Patient ID: Nicole Montgomery, female   DOB: 06-28-1940, 82 y.o.   MRN: [id number]     05/08/2023 06/15/2023  Outreach Medication One  Manufacturer Medication One [employer] [facility] Ozempic   Dose of Ozempic 4mg /42ml   Type of Radiographer, therapeutic Assistance   Date Application Received From Patient 04/29/2023   Application Items Received From Patient Application;Proof of Income;Other   Date Application Received From Provider 04/13/2023   Date Application Submitted to Manufacturer 05/07/2023   Method Application Sent to Manufacturer Fax   Patient Assistance Determination  Approved  Approval Start Date  06/17/2023  Approval End Date  06/11/2024    Care coordination call placed to [employer] in regard to Ozempic. Per the IVR system, patient is APPROVED 06/17/23-06/11/24. Medication will auto fill and auto ship to prescriber's office based on last fill date in 2025. Patient may call Novo Nordisk at any time to check on next refill and ship dates by calling [phone].  Signature  Kristopher Glee [facility]  Office: [phone] Fax: [phone] Email: [email or web address]

## 2023-06-17 DIAGNOSIS — E782 Mixed hyperlipidemia: Secondary | ICD-10-CM | POA: Diagnosis not present

## 2023-06-17 DIAGNOSIS — E1159 Type 2 diabetes mellitus with other circulatory complications: Secondary | ICD-10-CM | POA: Diagnosis not present

## 2023-07-18 DIAGNOSIS — E1159 Type 2 diabetes mellitus with other circulatory complications: Secondary | ICD-10-CM | POA: Diagnosis not present

## 2023-07-18 DIAGNOSIS — E782 Mixed hyperlipidemia: Secondary | ICD-10-CM | POA: Diagnosis not present

## 2023-07-30 DIAGNOSIS — E782 Mixed hyperlipidemia: Secondary | ICD-10-CM | POA: Diagnosis not present

## 2023-07-30 DIAGNOSIS — E1159 Type 2 diabetes mellitus with other circulatory complications: Secondary | ICD-10-CM | POA: Diagnosis not present

## 2023-08-12 DIAGNOSIS — C44729 Squamous cell carcinoma of skin of left lower limb, including hip: Secondary | ICD-10-CM | POA: Diagnosis not present

## 2023-08-15 DIAGNOSIS — E1159 Type 2 diabetes mellitus with other circulatory complications: Secondary | ICD-10-CM | POA: Diagnosis not present

## 2023-08-15 DIAGNOSIS — E782 Mixed hyperlipidemia: Secondary | ICD-10-CM | POA: Diagnosis not present

## 2023-08-17 ENCOUNTER — Other Ambulatory Visit (HOSPITAL_BASED_OUTPATIENT_CLINIC_OR_DEPARTMENT_OTHER): Payer: Self-pay | Admitting: Family Medicine

## 2023-08-17 DIAGNOSIS — E782 Mixed hyperlipidemia: Secondary | ICD-10-CM | POA: Diagnosis not present

## 2023-08-17 DIAGNOSIS — I152 Hypertension secondary to endocrine disorders: Secondary | ICD-10-CM | POA: Diagnosis not present

## 2023-08-17 DIAGNOSIS — N1831 Chronic kidney disease, stage 3a: Secondary | ICD-10-CM | POA: Diagnosis not present

## 2023-08-17 DIAGNOSIS — Z6822 Body mass index (BMI) 22.0-22.9, adult: Secondary | ICD-10-CM | POA: Diagnosis not present

## 2023-08-17 DIAGNOSIS — R339 Retention of urine, unspecified: Secondary | ICD-10-CM

## 2023-08-17 DIAGNOSIS — E1159 Type 2 diabetes mellitus with other circulatory complications: Secondary | ICD-10-CM | POA: Diagnosis not present

## 2023-08-19 DIAGNOSIS — C44729 Squamous cell carcinoma of skin of left lower limb, including hip: Secondary | ICD-10-CM | POA: Diagnosis not present

## 2023-08-26 DIAGNOSIS — Z79899 Other long term (current) drug therapy: Secondary | ICD-10-CM | POA: Diagnosis not present

## 2023-09-04 ENCOUNTER — Inpatient Hospital Stay (HOSPITAL_BASED_OUTPATIENT_CLINIC_OR_DEPARTMENT_OTHER): Admission: RE | Admit: 2023-09-04 | Source: Ambulatory Visit | Admitting: Radiology

## 2023-09-09 ENCOUNTER — Ambulatory Visit (INDEPENDENT_AMBULATORY_CARE_PROVIDER_SITE_OTHER)
Admission: RE | Admit: 2023-09-09 | Discharge: 2023-09-09 | Disposition: A | Discharge status: Home / Self Care | Source: Ambulatory Visit | Attending: Family Medicine | Admitting: Family Medicine

## 2023-09-09 DIAGNOSIS — R339 Retention of urine, unspecified: Secondary | ICD-10-CM | POA: Diagnosis not present

## 2023-09-09 DIAGNOSIS — N200 Calculus of kidney: Secondary | ICD-10-CM | POA: Diagnosis not present

## 2023-09-15 DIAGNOSIS — M1611 Unilateral primary osteoarthritis, right hip: Secondary | ICD-10-CM | POA: Diagnosis not present

## 2023-09-15 DIAGNOSIS — E1159 Type 2 diabetes mellitus with other circulatory complications: Secondary | ICD-10-CM | POA: Diagnosis not present

## 2023-10-08 DIAGNOSIS — L570 Actinic keratosis: Secondary | ICD-10-CM | POA: Diagnosis not present

## 2023-10-08 DIAGNOSIS — L578 Other skin changes due to chronic exposure to nonionizing radiation: Secondary | ICD-10-CM | POA: Diagnosis not present

## 2023-10-08 DIAGNOSIS — C44729 Squamous cell carcinoma of skin of left lower limb, including hip: Secondary | ICD-10-CM | POA: Diagnosis not present

## 2023-10-15 DIAGNOSIS — E1159 Type 2 diabetes mellitus with other circulatory complications: Secondary | ICD-10-CM | POA: Diagnosis not present

## 2023-10-15 DIAGNOSIS — M1611 Unilateral primary osteoarthritis, right hip: Secondary | ICD-10-CM | POA: Diagnosis not present

## 2023-11-15 DIAGNOSIS — M1611 Unilateral primary osteoarthritis, right hip: Secondary | ICD-10-CM | POA: Diagnosis not present

## 2023-11-15 DIAGNOSIS — E1159 Type 2 diabetes mellitus with other circulatory complications: Secondary | ICD-10-CM | POA: Diagnosis not present

## 2023-12-15 DIAGNOSIS — E1159 Type 2 diabetes mellitus with other circulatory complications: Secondary | ICD-10-CM | POA: Diagnosis not present

## 2023-12-15 DIAGNOSIS — M1611 Unilateral primary osteoarthritis, right hip: Secondary | ICD-10-CM | POA: Diagnosis not present

## 2023-12-30 DIAGNOSIS — C44622 Squamous cell carcinoma of skin of right upper limb, including shoulder: Secondary | ICD-10-CM | POA: Diagnosis not present

## 2024-01-06 DIAGNOSIS — C44622 Squamous cell carcinoma of skin of right upper limb, including shoulder: Secondary | ICD-10-CM | POA: Diagnosis not present

## 2024-01-15 ENCOUNTER — Inpatient Hospital Stay (HOSPITAL_COMMUNITY)
Admission: EM | Admit: 2024-01-15 | Discharge: 2024-01-19 | DRG: 389 | Disposition: A | Discharge status: Home Health | Attending: Infectious Diseases | Admitting: Infectious Diseases

## 2024-01-15 ENCOUNTER — Encounter (HOSPITAL_COMMUNITY): Payer: Self-pay

## 2024-01-15 ENCOUNTER — Other Ambulatory Visit: Payer: Self-pay

## 2024-01-15 ENCOUNTER — Emergency Department (HOSPITAL_COMMUNITY)

## 2024-01-15 DIAGNOSIS — T17920A Food in respiratory tract, part unspecified causing asphyxiation, initial encounter: Secondary | ICD-10-CM | POA: Diagnosis not present

## 2024-01-15 DIAGNOSIS — D869 Sarcoidosis, unspecified: Secondary | ICD-10-CM | POA: Diagnosis present

## 2024-01-15 DIAGNOSIS — K8689 Other specified diseases of pancreas: Secondary | ICD-10-CM | POA: Diagnosis present

## 2024-01-15 DIAGNOSIS — R918 Other nonspecific abnormal finding of lung field: Secondary | ICD-10-CM | POA: Diagnosis not present

## 2024-01-15 DIAGNOSIS — R1110 Vomiting, unspecified: Secondary | ICD-10-CM

## 2024-01-15 DIAGNOSIS — I700 Atherosclerosis of aorta: Secondary | ICD-10-CM | POA: Diagnosis not present

## 2024-01-15 DIAGNOSIS — R112 Nausea with vomiting, unspecified: Secondary | ICD-10-CM | POA: Diagnosis not present

## 2024-01-15 DIAGNOSIS — K56600 Partial intestinal obstruction, unspecified as to cause: Secondary | ICD-10-CM | POA: Diagnosis not present

## 2024-01-15 DIAGNOSIS — E089 Diabetes mellitus due to underlying condition without complications: Secondary | ICD-10-CM

## 2024-01-15 DIAGNOSIS — I10 Essential (primary) hypertension: Secondary | ICD-10-CM | POA: Diagnosis present

## 2024-01-15 DIAGNOSIS — Z9081 Acquired absence of spleen: Secondary | ICD-10-CM

## 2024-01-15 DIAGNOSIS — Z885 Allergy status to narcotic agent status: Secondary | ICD-10-CM

## 2024-01-15 DIAGNOSIS — Z9049 Acquired absence of other specified parts of digestive tract: Secondary | ICD-10-CM

## 2024-01-15 DIAGNOSIS — R109 Unspecified abdominal pain: Secondary | ICD-10-CM

## 2024-01-15 DIAGNOSIS — D75839 Thrombocytosis, unspecified: Secondary | ICD-10-CM | POA: Diagnosis present

## 2024-01-15 DIAGNOSIS — T17308A Unspecified foreign body in larynx causing other injury, initial encounter: Secondary | ICD-10-CM | POA: Diagnosis not present

## 2024-01-15 DIAGNOSIS — K567 Ileus, unspecified: Secondary | ICD-10-CM | POA: Diagnosis present

## 2024-01-15 DIAGNOSIS — Z7984 Long term (current) use of oral hypoglycemic drugs: Secondary | ICD-10-CM

## 2024-01-15 DIAGNOSIS — Z8507 Personal history of malignant neoplasm of pancreas: Secondary | ICD-10-CM

## 2024-01-15 DIAGNOSIS — Z7985 Long-term (current) use of injectable non-insulin antidiabetic drugs: Secondary | ICD-10-CM

## 2024-01-15 DIAGNOSIS — E119 Type 2 diabetes mellitus without complications: Secondary | ICD-10-CM | POA: Diagnosis present

## 2024-01-15 DIAGNOSIS — K222 Esophageal obstruction: Secondary | ICD-10-CM | POA: Diagnosis present

## 2024-01-15 DIAGNOSIS — Z79899 Other long term (current) drug therapy: Secondary | ICD-10-CM

## 2024-01-15 DIAGNOSIS — Z90411 Acquired partial absence of pancreas: Secondary | ICD-10-CM

## 2024-01-15 DIAGNOSIS — K56609 Unspecified intestinal obstruction, unspecified as to partial versus complete obstruction: Secondary | ICD-10-CM | POA: Diagnosis present

## 2024-01-15 DIAGNOSIS — R1084 Generalized abdominal pain: Secondary | ICD-10-CM | POA: Diagnosis not present

## 2024-01-15 DIAGNOSIS — Z888 Allergy status to other drugs, medicaments and biological substances status: Secondary | ICD-10-CM

## 2024-01-15 DIAGNOSIS — Z7982 Long term (current) use of aspirin: Secondary | ICD-10-CM

## 2024-01-15 DIAGNOSIS — R9389 Abnormal findings on diagnostic imaging of other specified body structures: Secondary | ICD-10-CM | POA: Diagnosis not present

## 2024-01-15 DIAGNOSIS — D72829 Elevated white blood cell count, unspecified: Secondary | ICD-10-CM | POA: Insufficient documentation

## 2024-01-15 DIAGNOSIS — K219 Gastro-esophageal reflux disease without esophagitis: Secondary | ICD-10-CM | POA: Diagnosis present

## 2024-01-15 DIAGNOSIS — R140 Abdominal distension (gaseous): Secondary | ICD-10-CM | POA: Diagnosis not present

## 2024-01-15 DIAGNOSIS — Z9889 Other specified postprocedural states: Secondary | ICD-10-CM

## 2024-01-15 DIAGNOSIS — E871 Hypo-osmolality and hyponatremia: Secondary | ICD-10-CM | POA: Diagnosis present

## 2024-01-15 DIAGNOSIS — E785 Hyperlipidemia, unspecified: Secondary | ICD-10-CM | POA: Diagnosis present

## 2024-01-15 DIAGNOSIS — Z794 Long term (current) use of insulin: Secondary | ICD-10-CM

## 2024-01-15 DIAGNOSIS — G500 Trigeminal neuralgia: Secondary | ICD-10-CM | POA: Diagnosis present

## 2024-01-15 DIAGNOSIS — K224 Dyskinesia of esophagus: Secondary | ICD-10-CM | POA: Diagnosis present

## 2024-01-15 DIAGNOSIS — Z85828 Personal history of other malignant neoplasm of skin: Secondary | ICD-10-CM

## 2024-01-15 LAB — BASIC METABOLIC PANEL WITH GFR
Anion gap: 13 (ref 5–15)
BUN: 16 mg/dL (ref 8–23)
CO2: 18 mmol/L — ABNORMAL LOW (ref 22–32)
Calcium: 9.4 mg/dL (ref 8.9–10.3)
Chloride: 97 mmol/L — ABNORMAL LOW (ref 98–111)
Creatinine, Ser: 1.26 mg/dL — ABNORMAL HIGH (ref 0.44–1.00)
GFR, Estimated: 42 mL/min — ABNORMAL LOW (ref >60)
Glucose, Bld: 174 mg/dL — ABNORMAL HIGH (ref 70–99)
Potassium: 4 mmol/L (ref 3.5–5.1)
Sodium: 128 mmol/L — ABNORMAL LOW (ref 135–145)

## 2024-01-15 LAB — CBC
HCT: 45.1 % (ref 36.0–46.0)
Hemoglobin: 14.4 g/dL (ref 12.0–15.0)
MCH: 30.4 pg (ref 26.0–34.0)
MCHC: 31.9 g/dL (ref 30.0–36.0)
MCV: 95.1 fL (ref 80.0–100.0)
Platelets: 455 K/uL — ABNORMAL HIGH (ref 150–400)
RBC: 4.74 MIL/uL (ref 3.87–5.11)
RDW: 14.1 % (ref 11.5–15.5)
WBC: 12.9 K/uL — ABNORMAL HIGH (ref 4.0–10.5)
nRBC: 0 % (ref 0.0–0.2)

## 2024-01-15 NOTE — ED Triage Notes (Signed)
 Pt reports choking on oral medication this morning. Pt thinks there is pill stuck in esophagus. Pt able to talk. No resp distress. Pt has recurrent choking episodes. VSS. axox4.

## 2024-01-15 NOTE — ED Provider Triage Note (Signed)
 Emergency Medicine Provider Triage Evaluation Note  Nicole Montgomery , a 83 y.o. female  was evaluated in triage.  Pt complains of choking episode.  Patient reports that she was taking her pills earlier today when she felt like 1 did not go down all the way.  She has history of esophageal dysmotility, often has choking episodes.  She feels in her normal state of health now.  No other complaints at this time.  Review of Systems  Positive: As above Negative: As above  Physical Exam  BP 102/72 (BP Location: Left Arm)   Pulse (!) 103   Temp 98.9 F (37.2 C) (Oral)   Resp (!) 24   SpO2 100%  Gen:   Awake, no distress   Resp:  Normal effort  MSK:   Moves extremities without difficulty  Other:    Medical Decision Making  Medically screening exam initiated at 5:18 PM.  Appropriate orders placed.  NAKYLA BRACCO was informed that the remainder of the evaluation will be completed by another provider, this initial triage assessment does not replace that evaluation, and the importance of remaining in the ED until their evaluation is complete.     Nicole, Montgomery, [place] 01/15/24 1718

## 2024-01-15 NOTE — ED Notes (Signed)
 RN updated husband, Marcey

## 2024-01-16 ENCOUNTER — Emergency Department (HOSPITAL_COMMUNITY)

## 2024-01-16 ENCOUNTER — Inpatient Hospital Stay (HOSPITAL_COMMUNITY)

## 2024-01-16 DIAGNOSIS — K8689 Other specified diseases of pancreas: Secondary | ICD-10-CM | POA: Diagnosis not present

## 2024-01-16 DIAGNOSIS — T17308A Unspecified foreign body in larynx causing other injury, initial encounter: Secondary | ICD-10-CM

## 2024-01-16 DIAGNOSIS — R918 Other nonspecific abnormal finding of lung field: Secondary | ICD-10-CM | POA: Diagnosis not present

## 2024-01-16 DIAGNOSIS — R109 Unspecified abdominal pain: Secondary | ICD-10-CM | POA: Diagnosis not present

## 2024-01-16 DIAGNOSIS — Z9081 Acquired absence of spleen: Secondary | ICD-10-CM | POA: Diagnosis not present

## 2024-01-16 DIAGNOSIS — K222 Esophageal obstruction: Secondary | ICD-10-CM | POA: Diagnosis not present

## 2024-01-16 DIAGNOSIS — Z90710 Acquired absence of both cervix and uterus: Secondary | ICD-10-CM | POA: Diagnosis not present

## 2024-01-16 DIAGNOSIS — Z794 Long term (current) use of insulin: Secondary | ICD-10-CM | POA: Diagnosis not present

## 2024-01-16 DIAGNOSIS — K567 Ileus, unspecified: Secondary | ICD-10-CM | POA: Diagnosis not present

## 2024-01-16 DIAGNOSIS — E089 Diabetes mellitus due to underlying condition without complications: Secondary | ICD-10-CM | POA: Diagnosis not present

## 2024-01-16 DIAGNOSIS — E869 Volume depletion, unspecified: Secondary | ICD-10-CM | POA: Diagnosis not present

## 2024-01-16 DIAGNOSIS — Z7984 Long term (current) use of oral hypoglycemic drugs: Secondary | ICD-10-CM | POA: Diagnosis not present

## 2024-01-16 DIAGNOSIS — D75839 Thrombocytosis, unspecified: Secondary | ICD-10-CM | POA: Diagnosis not present

## 2024-01-16 DIAGNOSIS — E119 Type 2 diabetes mellitus without complications: Secondary | ICD-10-CM | POA: Diagnosis not present

## 2024-01-16 DIAGNOSIS — Z7982 Long term (current) use of aspirin: Secondary | ICD-10-CM | POA: Diagnosis not present

## 2024-01-16 DIAGNOSIS — Z8507 Personal history of malignant neoplasm of pancreas: Secondary | ICD-10-CM | POA: Diagnosis not present

## 2024-01-16 DIAGNOSIS — R112 Nausea with vomiting, unspecified: Secondary | ICD-10-CM | POA: Diagnosis not present

## 2024-01-16 DIAGNOSIS — Z7985 Long-term (current) use of injectable non-insulin antidiabetic drugs: Secondary | ICD-10-CM | POA: Diagnosis not present

## 2024-01-16 DIAGNOSIS — I10 Essential (primary) hypertension: Secondary | ICD-10-CM | POA: Diagnosis not present

## 2024-01-16 DIAGNOSIS — Z90411 Acquired partial absence of pancreas: Secondary | ICD-10-CM | POA: Diagnosis not present

## 2024-01-16 DIAGNOSIS — E871 Hypo-osmolality and hyponatremia: Secondary | ICD-10-CM | POA: Diagnosis not present

## 2024-01-16 DIAGNOSIS — Z9049 Acquired absence of other specified parts of digestive tract: Secondary | ICD-10-CM | POA: Diagnosis not present

## 2024-01-16 DIAGNOSIS — R140 Abdominal distension (gaseous): Secondary | ICD-10-CM | POA: Diagnosis not present

## 2024-01-16 DIAGNOSIS — R1110 Vomiting, unspecified: Secondary | ICD-10-CM

## 2024-01-16 DIAGNOSIS — Z85828 Personal history of other malignant neoplasm of skin: Secondary | ICD-10-CM | POA: Diagnosis not present

## 2024-01-16 DIAGNOSIS — R9389 Abnormal findings on diagnostic imaging of other specified body structures: Secondary | ICD-10-CM | POA: Diagnosis not present

## 2024-01-16 DIAGNOSIS — K56600 Partial intestinal obstruction, unspecified as to cause: Secondary | ICD-10-CM | POA: Diagnosis not present

## 2024-01-16 DIAGNOSIS — K224 Dyskinesia of esophagus: Secondary | ICD-10-CM | POA: Diagnosis not present

## 2024-01-16 DIAGNOSIS — D72829 Elevated white blood cell count, unspecified: Secondary | ICD-10-CM | POA: Insufficient documentation

## 2024-01-16 DIAGNOSIS — R1084 Generalized abdominal pain: Secondary | ICD-10-CM | POA: Diagnosis not present

## 2024-01-16 DIAGNOSIS — D869 Sarcoidosis, unspecified: Secondary | ICD-10-CM | POA: Diagnosis not present

## 2024-01-16 DIAGNOSIS — J9809 Other diseases of bronchus, not elsewhere classified: Secondary | ICD-10-CM | POA: Diagnosis not present

## 2024-01-16 DIAGNOSIS — Z9889 Other specified postprocedural states: Secondary | ICD-10-CM

## 2024-01-16 DIAGNOSIS — E785 Hyperlipidemia, unspecified: Secondary | ICD-10-CM | POA: Diagnosis not present

## 2024-01-16 DIAGNOSIS — K56609 Unspecified intestinal obstruction, unspecified as to partial versus complete obstruction: Secondary | ICD-10-CM

## 2024-01-16 DIAGNOSIS — Z79899 Other long term (current) drug therapy: Secondary | ICD-10-CM | POA: Diagnosis not present

## 2024-01-16 DIAGNOSIS — K5669 Other partial intestinal obstruction: Secondary | ICD-10-CM | POA: Diagnosis not present

## 2024-01-16 DIAGNOSIS — R1319 Other dysphagia: Secondary | ICD-10-CM | POA: Diagnosis not present

## 2024-01-16 DIAGNOSIS — G500 Trigeminal neuralgia: Secondary | ICD-10-CM | POA: Diagnosis not present

## 2024-01-16 DIAGNOSIS — Z4682 Encounter for fitting and adjustment of non-vascular catheter: Secondary | ICD-10-CM | POA: Diagnosis not present

## 2024-01-16 DIAGNOSIS — Z885 Allergy status to narcotic agent status: Secondary | ICD-10-CM | POA: Diagnosis not present

## 2024-01-16 DIAGNOSIS — I700 Atherosclerosis of aorta: Secondary | ICD-10-CM | POA: Diagnosis not present

## 2024-01-16 DIAGNOSIS — Z888 Allergy status to other drugs, medicaments and biological substances status: Secondary | ICD-10-CM | POA: Diagnosis not present

## 2024-01-16 LAB — HEPATIC FUNCTION PANEL
ALT: 19 U/L (ref 0–44)
AST: 28 U/L (ref 15–41)
Albumin: 3.5 g/dL (ref 3.5–5.0)
Alkaline Phosphatase: 85 U/L (ref 38–126)
Bilirubin, Direct: 0.1 mg/dL (ref 0.0–0.2)
Indirect Bilirubin: 0.7 mg/dL (ref 0.3–0.9)
Total Bilirubin: 0.8 mg/dL (ref 0.0–1.2)
Total Protein: 6.8 g/dL (ref 6.5–8.1)

## 2024-01-16 LAB — GLUCOSE, CAPILLARY
Glucose-Capillary: 117 mg/dL — ABNORMAL HIGH (ref 70–99)
Glucose-Capillary: 97 mg/dL (ref 70–99)
Glucose-Capillary: 98 mg/dL (ref 70–99)

## 2024-01-16 LAB — TROPONIN I (HIGH SENSITIVITY)
Troponin I (High Sensitivity): 6 ng/L (ref <18)
Troponin I (High Sensitivity): 6 ng/L (ref <18)

## 2024-01-16 LAB — CBG MONITORING, ED: Glucose-Capillary: 131 mg/dL — ABNORMAL HIGH (ref 70–99)

## 2024-01-16 LAB — LIPASE, BLOOD: Lipase: 28 U/L (ref 11–51)

## 2024-01-16 MED ORDER — MUPIROCIN 2 % EX OINT
TOPICAL_OINTMENT | Freq: Two times a day (BID) | CUTANEOUS | Status: DC
  Administered 2024-01-18: 1 via TOPICAL
  Filled 2024-01-16 (×3): qty 22

## 2024-01-16 MED ORDER — IOHEXOL 350 MG/ML SOLN
75.0000 mL | Freq: Once | INTRAVENOUS | Status: AC | PRN
  Administered 2024-01-16: 75 mL via INTRAVENOUS

## 2024-01-16 MED ORDER — LORAZEPAM 2 MG/ML IJ SOLN
0.5000 mg | Freq: Once | INTRAMUSCULAR | Status: AC
  Administered 2024-01-16: 0.5 mg via INTRAVENOUS
  Filled 2024-01-16: qty 1

## 2024-01-16 MED ORDER — HYDROMORPHONE HCL 1 MG/ML IJ SOLN: 0.5000 mg | INTRAMUSCULAR | Status: DC | PRN

## 2024-01-16 MED ORDER — DIATRIZOATE MEGLUMINE & SODIUM 66-10 % PO SOLN
90.0000 mL | Freq: Once | ORAL | Status: DC
  Filled 2024-01-16: qty 90

## 2024-01-16 MED ORDER — GLUCAGON HCL RDNA (DIAGNOSTIC) 1 MG IJ SOLR
1.0000 mg | Freq: Once | INTRAMUSCULAR | Status: AC
  Administered 2024-01-16: 1 mg via INTRAVENOUS
  Filled 2024-01-16: qty 1

## 2024-01-16 MED ORDER — PANTOPRAZOLE SODIUM 40 MG IV SOLR
40.0000 mg | INTRAVENOUS | Status: DC
  Administered 2024-01-16 – 2024-01-18 (×3): 40 mg via INTRAVENOUS
  Filled 2024-01-16 (×3): qty 10

## 2024-01-16 MED ORDER — LACTATED RINGERS IV BOLUS
1000.0000 mL | Freq: Once | INTRAVENOUS | Status: AC
  Administered 2024-01-16: 1000 mL via INTRAVENOUS

## 2024-01-16 MED ORDER — SODIUM CHLORIDE 0.9 % IV BOLUS
1000.0000 mL | Freq: Once | INTRAVENOUS | Status: AC
  Administered 2024-01-16: 1000 mL via INTRAVENOUS

## 2024-01-16 MED ORDER — SODIUM CHLORIDE 0.9 % IV SOLN: INTRAVENOUS | Status: AC

## 2024-01-16 MED ORDER — INSULIN ASPART 100 UNIT/ML IJ SOLN
0.0000 [IU] | INTRAMUSCULAR | Status: DC
  Administered 2024-01-16 – 2024-01-17 (×2): 1 [IU] via SUBCUTANEOUS
  Administered 2024-01-18: 3 [IU] via SUBCUTANEOUS
  Administered 2024-01-18: 1 [IU] via SUBCUTANEOUS
  Administered 2024-01-18: 3 [IU] via SUBCUTANEOUS

## 2024-01-16 MED ORDER — FLUOROURACIL 5 % EX CREA: 1.0000 | TOPICAL_CREAM | Freq: Two times a day (BID) | CUTANEOUS | Status: DC

## 2024-01-16 MED ORDER — ONDANSETRON HCL 4 MG/2ML IJ SOLN
4.0000 mg | Freq: Four times a day (QID) | INTRAMUSCULAR | Status: DC | PRN
  Administered 2024-01-17: 4 mg via INTRAVENOUS
  Filled 2024-01-16: qty 2

## 2024-01-16 MED ORDER — INSULIN GLARGINE-YFGN 100 UNIT/ML ~~LOC~~ SOLN
5.0000 [IU] | Freq: Every day | SUBCUTANEOUS | Status: DC
  Administered 2024-01-16 – 2024-01-19 (×4): 5 [IU] via SUBCUTANEOUS
  Filled 2024-01-16 (×4): qty 0.05

## 2024-01-16 MED ORDER — HEPARIN SODIUM (PORCINE) 5000 UNIT/ML IJ SOLN
5000.0000 [IU] | Freq: Three times a day (TID) | INTRAMUSCULAR | Status: DC
  Administered 2024-01-16 – 2024-01-19 (×10): 5000 [IU] via SUBCUTANEOUS
  Filled 2024-01-16 (×10): qty 1

## 2024-01-16 NOTE — ED Notes (Signed)
 MD notified of IV access difficulty for CT PE study

## 2024-01-16 NOTE — ED Notes (Signed)
 Rad called for NG tube placement verification

## 2024-01-16 NOTE — ED Notes (Signed)
 XR at bedside

## 2024-01-16 NOTE — ED Notes (Signed)
 Awaiting efudex  verification

## 2024-01-16 NOTE — Consult Note (Signed)
 Surgical Evaluation Requesting provider: Dr. Ginger  Chief Complaint: choking/ dysphagia  HPI: 83 year old woman with history of diabetes, hypertension, sarcoidosis, pancreatic cancer status post distal pancreatectomy/splenectomy/partial colectomy in 2012 as well as prior incisional hernia repair, appendectomy, hysterectomy, some kind of esophageal wrap and open cholecystectomy who presented to the [facility] emergency department yesterday afternoon due to choking on oral medication.  Was concerned of having medication stuck in the esophagus.  This had been gone about 2 days ago and she reported chest discomfort and sensation of esophageal spasm.  Also endorsed nausea but no emesis.  When asked directly does states she had abdominal pain but does not bring this up on her own, and per chart review also sounds like at some point she reported dry heaving, gagging, and abdominal pain/discomfort.  The esophageal discomfort seems to have dissipated at some point during her time in the emergency department.  Reports her last bowel movement was yesterday and she has had some flatus this morning.    She underwent CT imaging due to shortness of breath and difficulty swallowing with reported abdominal pain.  CT shows evidence of ileus versus partial small bowel obstruction.  No abrupt transition point.  Also notes collapse and featureless distal sigmoid and patient reports history of irritable bowel syndrome with intermittent diarrhea.  CT chest was also completed which was negative for PE, but did note high-grade focal luminal narrowing of the right middle lobe bronchus which is unchanged from prior exam, scattered lung nodules and multiple prominent upper abdominal lymph nodes of unknown clinical significance.  Allergies  Allergen Reactions   Codeine Other (See Comments)    Pt states she gets really sweaty and light-headed, like she's going to pass out.   Baclofen Swelling    Tingling and swelling in hands and  feet   Carbamazepine     Other Reaction(s): Other (See Comments)  Liver dysfunction at high doses  ask  Liver dysfunction at high doses  Liver dysfunction at high doses, ask   Gabapentin      Other Reaction(s): Confusion, Other (See Comments)  Confusion and memory loss, lack of concentration with higher dose  ask  Confusion and memory loss, lack of concentration with higher dose  Confusion and memory loss, lack of concentration with higher dose, ask   Oxycodone -Acetaminophen  Nausea And Vomiting    Other Reaction(s): GI Intolerance, Vomiting  Stomach burning   Pregabalin     Other Reaction(s): Other (See Comments)  Weight gain, fluid retention, trouble concentrating  ask  Weight gain, fluid retention, trouble concentrating  Weight gain, fluid retention, trouble concentrating, ask   Topiramate     Other Reaction(s): Confusion, Confusion (intolerance), Mental Status Changes    Past Medical History:  Diagnosis Date   DM (diabetes mellitus) (HCC)    s/p pancreatectomy   H/O splenectomy 2012   HTN (hypertension)    Nephrolithiasis    Pancreatic cancer (HCC) 2012   Sarcoid 2012    Past Surgical History:  Procedure Laterality Date   ABDOMINAL HYSTERECTOMY     APPENDECTOMY     CHOLECYSTECTOMY     PANCREATECTOMY  2012   PARTIAL COLECTOMY  2012   SPLENECTOMY  2012    History reviewed. No pertinent family history.  Social History   Socioeconomic History   Marital status: Married    Spouse name: Not on file   Number of children: Not on file   Years of education: Not on file   Highest education level: Not on file  Occupational History   Not on file  Tobacco Use   Smoking status: Never   Smokeless tobacco: Never  Substance and Sexual Activity   Alcohol  use: Not on file   Drug use: Not on file   Sexual activity: Not on file  Other Topics Concern   Not on file  Social History Narrative   Not on file   Social Drivers of Health   Financial Resource  Strain: Not on file  Food Insecurity: No Food Insecurity (05/04/2023)   Hunger Vital Sign    Worried About Running Out of Food in the Last Year: Never true    Ran Out of Food in the Last Year: Never true  Transportation Needs: No Transportation Needs (05/04/2023)   PRAPARE - Administrator, Civil Service (Medical): No    Lack of Transportation (Non-Medical): No  Physical Activity: Not on file  Stress: Not on file  Social Connections: Not on file    No current facility-administered medications on file prior to encounter.   Current Outpatient Medications on File Prior to Encounter  Medication Sig Dispense Refill   acetaminophen  (TYLENOL ) 500 MG tablet Take 2 tablets (1,000 mg total) by mouth every 6 (six) hours as needed.     amitriptyline  (ELAVIL ) 50 MG tablet Take 1 tablet (50 mg total) by mouth at bedtime. 30 tablet 0   aspirin  EC 81 MG tablet Take 81 mg by mouth daily. Swallow whole.     B-D ULTRAFINE III SHORT PEN 31G X 8 MM MISC SMARTSIG:1 Pen Needle SUB-Q Daily     DULoxetine  (CYMBALTA ) 60 MG capsule Take 60 mg by mouth daily.     fluconazole (DIFLUCAN) 100 MG tablet Take 100 mg by mouth daily as needed.     fluorouracil  (EFUDEX ) 5 % cream Apply 1 Application topically 2 (two) times daily.     gabapentin  (NEURONTIN ) 300 MG capsule Take 300 mg by mouth 4 (four) times daily. Take with 600mg  Gabapentin      gabapentin  (NEURONTIN ) 600 MG tablet Take 600 mg by mouth 4 (four) times daily. Take with 300mg  Gabapentin      insulin  glargine (LANTUS ) 100 UNIT/ML injection Inject 5 Units into the skin at bedtime.     Melatonin Gummies 5 MG CHEW Chew 1 each by mouth at bedtime.     methocarbamol  (ROBAXIN ) 500 MG tablet Take 1 tablet (500 mg total) by mouth every 8 (eight) hours as needed for muscle spasms. 30 tablet 0   Multiple Vitamin (MULTIVITAMIN WITH MINERALS) TABS tablet Take 1 tablet by mouth daily.     ondansetron  (ZOFRAN ) 4 MG tablet Take 4 mg by mouth every 8 (eight) hours  as needed for nausea or vomiting.     oxyCODONE  (OXY IR/ROXICODONE ) 5 MG immediate release tablet Take 1 tablet (5 mg total) by mouth every 6 (six) hours as needed (pain). 20 tablet 0   pioglitazone  (ACTOS ) 45 MG tablet Take 45 mg by mouth daily.     polyethylene glycol powder (GLYCOLAX /MIRALAX ) powder Take 17 g by mouth daily.     pravastatin  (PRAVACHOL ) 80 MG tablet Take 80 mg by mouth daily.  0   Semaglutide,0.25 or 0.5MG /DOS, (OZEMPIC, 0.25 OR 0.5 MG/DOSE,) 2 MG/1.5ML SOPN Inject 0.5 mg into the skin once a week. On Tuesdays     sulfamethoxazole -trimethoprim  (BACTRIM  DS) 800-160 MG tablet Take 1 tablet by mouth 2 (two) times daily.      Review of Systems: a complete, 10pt review of systems was completed with pertinent  positives and negatives as documented in the HPI  Physical Exam: Vitals:   01/16/24 1045 01/16/24 1102  BP: 128/76   Pulse: 87   Resp: 18   Temp:  98.6 F (37 C)  SpO2: 97%    Gen: A&Ox3, no distress  Eyes: lids and conjunctivae normal, no icterus. Pupils equally round and reactive to light.  Neck: supple without mass or thyromegaly Chest: respiratory effort is normal. No crepitus or tenderness on palpation of the chest. Breath sounds equal.  Cardiovascular: RRR with palpable distal pulses, no pedal edema Gastrointestinal: soft, mildly distended, nontender.  Right subcostal as well as transverse upper abdominal incisions noted Muscoloskeletal: no clubbing or cyanosis of the fingers.  Strength is symmetrical throughout.  Range of motion of bilateral upper and lower extremities normal without pain, crepitation or contracture. Neuro: cranial nerves grossly intact.  Sensation intact to light touch diffusely. Psych: appropriate mood and affect, normal insight/judgment intact  Skin: warm and dry, has a healing wound on her right hand from recent squamous cell carcinoma excision      Latest Ref Rng & Units 01/15/2024    5:18 PM 05/06/2023    7:31 AM 05/05/2023   10:02 AM   CBC  WBC 4.0 - 10.5 K/uL 12.9  10.5  12.0   Hemoglobin 12.0 - 15.0 g/dL 85.5  88.2  88.6   Hematocrit 36.0 - 46.0 % 45.1  37.1  36.2   Platelets 150 - 400 K/uL 455  386  381        Latest Ref Rng & Units 01/16/2024   12:53 AM 01/15/2024    5:18 PM 05/06/2023    7:31 AM  CMP  Glucose 70 - 99 mg/dL  825  843   BUN 8 - 23 mg/dL  16  25   Creatinine 9.55 - 1.00 mg/dL  8.73  8.92   Sodium 864 - 145 mmol/L  128  137   Potassium 3.5 - 5.1 mmol/L  4.0  4.5   Chloride 98 - 111 mmol/L  97  107   CO2 22 - 32 mmol/L  18  23   Calcium 8.9 - 10.3 mg/dL  9.4  8.6   Total Protein 6.5 - 8.1 g/dL 6.8     Total Bilirubin 0.0 - 1.2 mg/dL 0.8     Alkaline Phos 38 - 126 U/L 85     AST 15 - 41 U/L 28     ALT 0 - 44 U/L 19       Lab Results  Component Value Date   INR 1.2 06/17/2022   INR 0.9 10/23/2007    Imaging: CT Angio Chest PE W and/or Wo Contrast Result Date: 01/16/2024 EXAM: CTA of the Chest with contrast for PE 01/16/2024 08:55:15 AM TECHNIQUE: CTA of the chest was performed after the administration of intravenous contrast. Multiplanar reformatted images are provided for review. MIP images are provided for review. Automated exposure control, iterative reconstruction, and/or weight based adjustment of the mA/kV was utilized to reduce the radiation dose to as low as reasonably achievable. COMPARISON: 04/24/2023 CLINICAL HISTORY: Pulmonary embolism (PE) suspected, low to intermediate prob, positive D-dimer; dysphagia, SOB. Omni 350 75cc; reports choking on oral medication this morning. Pt thinks there is pill stuck in esophagus. Pt able to talk. No resp distress. Pt has recurrent choking episode. FINDINGS: PULMONARY ARTERIES: Pulmonary arteries are adequately opacified for evaluation. No pulmonary embolism. Main pulmonary artery is normal in caliber. MEDIASTINUM: The heart and pericardium demonstrate no acute  abnormality. There is no acute abnormality of the thoracic aorta. LYMPH NODES: Subcarinal  lymph node measures 1.7 cm, image 169/5, unchanged from previous exam. Right paratracheal lymph node measures 1.2 cm, image 115/5. Previously 1.4 cm. Multiple prominent upper abdominal lymph nodes are identified with a similar appearance to the previous exam of unknown clinical significance. Index left periaortic node measures 1.3 cm, image 128/3. Previously 1 cm. Gastrohepatic ligament node measures 1.1 cm image 113/3. Previously 1 cm. LUNGS AND PLEURA: High-grade focal luminal narrowing of the right middle lobe bronchus is again noted and appears unchanged from the previous exam. Associated chronic partial atelectasis of the right middle lobe with volume loss. Chronic subsegmental atelectasis and volume loss involving the posterior left upper lobe and Lingula. Calcified granulomas identified within the apical segment of the right upper lobe. No pleural fluid or airspace consolidation. Diffuse bronchial wall thickening. There are scattered lung nodules identified bilaterally which have a nonspecific appearance the largest nodule is in the right lung base measuring 1 x 0.6 cm, image 94/4. On the previous exam, this measured 1.1 x 0.8 cm. Posterior left upper lobe lung nodule is unchanged measuring 0.4 cm, image 51/4. UPPER ABDOMEN: Postoperative changes at the GE junction possibly from prior fundoplication wrap. Gaseous distention of the stomach is noted, which results in asymmetric elevation of the left hemidiaphragm. Postoperative change from prior distal pancreatectomy. SOFT TISSUES AND BONES: There are new fractures involving the anterolateral aspect of the left 8th and 9th ribs. Chronic right lateral rib fracture deformities are noted. IMPRESSION: 1. No evidence of pulmonary embolism. 2. High-grade focal luminal narrowing of the right middle lobe bronchus with associated chronic partial atelectasis and volume loss, unchanged from previous exam. 3. Scattered bilateral lung nodules, largest in the right lung base  measuring 1 x 0.6 cm, previously 1.1 x 0.8 cm. Posterior left upper lobe nodule is unchanged at 0.4 cm. 4. Multiple prominent upper abdominal lymph nodes, similar to previous exam, of unknown clinical significance. Electronically signed by: Waddell Calk MD 01/16/2024 09:27 AM EDT RP Workstation: [id number]   CT ABDOMEN PELVIS W CONTRAST Result Date: 01/16/2024 EXAM: CT ABDOMEN AND PELVIS WITH CONTRAST 01/16/2024 08:55:15 AM TECHNIQUE: CT of the abdomen and pelvis was performed with the administration of intravenous contrast (75mL iohexol  (OMNIPAQUE ) 350 MG/ML injection). Multiplanar reformatted images are provided for review. Automated exposure control, iterative reconstruction, and/or weight based adjustment of the mA/kV was utilized to reduce the radiation dose to as low as reasonably achievable. COMPARISON: CT of the abdomen and pelvis dated 10/17/2000. CLINICAL HISTORY: Abdominal pain, acute, nonlocalized. Reports choking on oral medication this morning. Patient thinks there is pill stuck in esophagus. Patient able to talk. No respiratory distress. Patient has recurrent choking episode. FINDINGS: LOWER CHEST: Bronchiectasis and streaky peribronchovascular consolidation present within the base of the right middle lobe, as before. LIVER: The liver is unremarkable. GALLBLADDER AND BILE DUCTS: The patient is status post cholecystectomy. SPLEEN: The patient is status post splenectomy. PANCREAS: The patient is status post distal pancreatectomy. ADRENAL GLANDS: No acute abnormality. KIDNEYS, URETERS AND BLADDER: No stones in the kidneys or ureters. No hydronephrosis. No perinephric or periureteral stranding. Urinary bladder is unremarkable. GI AND BOWEL: The distal sigmoid colon remains collapsed and featureless. The proximal sigmoid colon remains mildly dilated with air and formed fecal material. The patient is status post partial left colectomy. There are loops of moderately distended mid to distal small bowel,  which measure up to approximately 3.2 cm in diameter. There is  no abrupt transition point. Findings are compatible with ileus or partial small bowel obstruction. PERITONEUM AND RETROPERITONEUM: The patient is again noted to be status post abdominal hernia repair. VASCULATURE: Moderate calcific coronary artery disease. LYMPH NODES: No lymphadenopathy. REPRODUCTIVE ORGANS: The patient is status post hysterectomy. There are no abnormal adnexal masses. BONES AND SOFT TISSUES: No acute osseous abnormality. No focal soft tissue abnormality. IMPRESSION: 1. Moderately distended mid to distal small bowel loops, measuring up to 3.2 cm in diameter, without an abrupt transition point, compatible with ileus or partial small bowel obstruction. 2. Collapsed and featureless distal sigmoid colon with mildly dilated proximal sigmoid colon containing air and formed fecal material. Electronically signed by: evalene berrigan 01/16/2024 09:21 AM EDT RP Workstation: [id number]   DG Chest 1 View Result Date: 01/15/2024 CLINICAL DATA:  Choking episode earlier today. Patient choked on oral medications. Feels like pills stuck in the esophagus. EXAM: CHEST  1 VIEW COMPARISON:  Chest and ribs 05/01/2023.  CT chest 05/04/2023 FINDINGS: Shallow inspiration with elevation of the left hemidiaphragm. Linear opacities in the lung bases are similar to the prior study, likely representing scarring. There is a circumscribed nodule projecting over the right lower lung. This is also unchanged. No pleural effusion or pneumothorax. Mediastinal contours appear intact. Calcification of the aorta. Old rib fractures. No radiopaque foreign bodies are identified. IMPRESSION: Shallow inspiration. Chronic linear and nodular opacities in the lung bases as described. No evidence of active pulmonary disease. Electronically Signed   By: Elsie Gravely M.D.   On: 01/15/2024 19:41     A/P: 83 year old woman with multiple prior abdominal surgeries who initially  came in due to sensation of pill dysphagia and sticking in her esophagus/choking but additionally reported abdominal pain and dry heaving, imaging most consistent with ileus although partial obstruction not out of the question given extensive surgical history.  Currently has a benign abdominal exam - Will order NG tube for decompression followed by small bowel obstruction protocol - Correct electrolytes/AKI per primary team   Patient Active Problem List   Diagnosis Date Noted   Trauma 05/04/2023   Pneumonia of right lung due to infectious organism 06/22/2022   Malnutrition of moderate degree 06/19/2022   On mechanically assisted ventilation (HCC) 06/18/2022   Pressure injury of skin 06/18/2022   DKA (diabetic ketoacidosis) (HCC) 06/17/2022   Influenza A 06/17/2022   Acute respiratory failure with hypoxia (HCC) 06/17/2022   Pain in left foot 01/28/2017   Plantar fasciitis, left 01/28/2017   Essential hypertension 04/12/2015   Gastroesophageal reflux disease without esophagitis 04/12/2015   Hyperlipidemia, unspecified 04/12/2015   Postherpetic trigeminal neuralgia 01/13/2013       Mitzie Freund, MD [facility]  See AMION to contact appropriate on-call provider

## 2024-01-16 NOTE — ED Notes (Signed)
 NG attempt for 14, 16, and 10 NG. Unable to advance 14 or 16 french. 10 Fr advanced by Izetta PEAK. XR to verify.

## 2024-01-16 NOTE — ED Provider Notes (Signed)
 Care assumed from Dr. Carita.  At time of care, patient awaiting results of CT study and CTA abdomen pelvis to look for concerning etiologies of her symptoms.  Patient initially was having some choking on pills but was reportedly doing somewhat better after some Ativan  and glucagon .  She was tolerating some fluids and secretions now and if CT imaging is reassuring, dissipate discussion about likely discharge home and outpatient GI and PCP follow-up.  10:45 AM CT scan returned showing evidence of ileus versus partial small bowel obstruction.  Patient reports she was having the vomiting and dry heaving and gagging and abdominal distention and discomfort.  With this finding, we will call general surgery but anticipate medicine admission.  10:58 AM Spoke to general surgery who agrees with medicine admission and they will see in consultation.  Patient agrees with plan for admission   Clinical Impression: 1. Partial small bowel obstruction (HCC)   2. Vomiting, unspecified vomiting type, unspecified whether nausea present   3. Choking, initial encounter   4. Abdominal pain, unspecified abdominal location     Disposition: Admit  This note was prepared with assistance of Dragon voice recognition software. Occasional wrong-word or sound-a-like substitutions may have occurred due to the inherent limitations of voice recognition software.     Esmee Fallaw, Lonni PARAS, MD 01/16/24 1145

## 2024-01-16 NOTE — ED Notes (Signed)
 Pt ambulated to bathroom with assistance from EMT.

## 2024-01-16 NOTE — H&P (Signed)
 Date: 01/16/2024               Patient Name:  Nicole Montgomery MRN: [id number]  DOB: 01/02/1941 Age / Sex: 83 y.o., female   PCP: Trinidad Hun, MD         Medical Service: Internal Medicine Teaching Service         Attending Physician: Dr. Reyes Montgomery      First Contact: Nicole King, DO    Second Contact: Dr. Libby Blanch, DO          Pager Information: First Contact Pager: [phone]   Second Contact Pager: [phone]   SUBJECTIVE   Chief Complaint: trouble swallowing  History of Present Illness: Nicole Montgomery is a 83 y.o. female with PMH of pancreatic adeno s/p partial pancreatectomy, fundoplication, sarcoidosis, trigeminal neuralgia, inflammatory bowel disorder, and T2DM. Presents to the ED after trying to take her morning medication. She takes the pills one at a time and felt like one got stuck in her throat. She's usually able to clear it but couldn't get the pill to go down. Her husband tried to help and did the heimlich maneuver which caused her pain. It became difficult for her to breath so they called 911 and came to the ED. She felt like her esophagus was spasm-ing and describes this feeling as twisting. She has had these spasms in the past and usually takes a cocktail when they happen.   She has been in the ED since Friday morning, IM team came to see her around 1pm on Saturday. Since being in the ED she has felt the need to vomit but can't vomit so she just dry heaves. This feeling coincided with abdominal pain that feels like her IBD. She did have a watery BM this morning in the ED. Normal color. She did trip 1 week ago and has soreness in her left abdomen.   She denies changes in urination, changes in vision, headache, new back or chest pain, and denies dizziness. She does not use oxygen at home.   Her pancreatectomy was in 2002 and she had the fundoplication in 2005 for recurrent burping. She recently had a BCC removed on her right thumb and is on a higher dose antibiotic  because her derm thought it was becoming infected. Has had to have 3 surgeries to remove the whole thing.    Spoke with husband on phone. He isnt here because he's watching their great gran children.  He states she hasn't been eating much at all because she's afraid to gain a pound. He said she did not become a diabetic until several years after her pancreatectomy.   ED Course: Labs significant for  CBC - Abnormal; Notable for the following components:      Result Value     WBC 12.9 (*)      Platelets 455 (*)      All other components within normal limits  BASIC METABOLIC PANEL WITH GFR - Abnormal; Notable for the following components:    Sodium 128 (*)      Chloride 97 (*)      CO2 18 (*)      Glucose, Bld 174 (*)      Creatinine, Ser 1.26 (*)      GFR, Estimated 42 (*)      All other components within normal limits   Imaging CTAP: distended small bowel compatible with ileus or partial SBO, collapsed distal sigmoid with dilated proximal sigmoid containing air and formed fecal  matter. CTA PE: no PE, chronic changes. CXR: no active pulm disease Received Fluids, glucagon , ativan , zofran  Consulted IM and surgery  Meds:  Tylenol  1000 mg every 6 hours Amitriptyline  50 mg nightly Aspirin  81 mg daily Cymbalta  60 mg daily Diflucan 100 mg as needed- old medicine  Fluorouracil  cream Gabapentin  300 mg 4 times daily Gabapentin  600 mg 4 times daily Lantus  5 units nightly Melatonin Gummies 5 mg nightly Robaxin  500 mg every 8 hours as needed- not doing that  Zofran  4 mg every 8 hours as needed Oxycodone  5 mg every 6 hours as needed Pioglitazone  45 mg daily MiraLAX  Pravastatin  80 mg daily Ozempic 0.5 mg weekly- Tuesdays Patient reported: yes  Current Meds  Medication Sig   acetaminophen  (TYLENOL ) 500 MG tablet Take 2 tablets (1,000 mg total) by mouth every 6 (six) hours as needed.   amitriptyline  (ELAVIL ) 50 MG tablet Take 1 tablet (50 mg total) by mouth at bedtime.   aspirin  EC 81  MG tablet Take 81 mg by mouth daily.   DULoxetine  (CYMBALTA ) 60 MG capsule Take 60 mg by mouth daily.   gabapentin  (NEURONTIN ) 300 MG capsule Take 300 mg by mouth 4 (four) times daily. take with 600mg  tablets to equal 900mg  four times daily.   gabapentin  (NEURONTIN ) 600 MG tablet Take 600 mg by mouth 4 (four) times daily. take with 300mg  capsules to equal 900mg  four times daily.   insulin  glargine (LANTUS ) 100 UNIT/ML injection Inject 5 Units into the skin at bedtime.   MELATONIN PO Take 5 each by mouth at bedtime. OTC melatonin gummies   Multiple Vitamins-Minerals (MULTIVITAMIN WOMEN 50+) TABS Take 1 tablet by mouth daily.   pioglitazone  (ACTOS ) 45 MG tablet Take 45 mg by mouth daily.   pravastatin  (PRAVACHOL ) 80 MG tablet Take 80 mg by mouth daily.   Semaglutide,0.25 or 0.5MG /DOS, (OZEMPIC, 0.25 OR 0.5 MG/DOSE,) 2 MG/1.5ML SOPN Inject 0.5 mg into the skin every Tuesday.    Past Medical History Sarcoidosis, pancreatic cancer c/p pancreatectomy 2002, HTN, T2DM, IBD, skin cancer, nephrolithiasis,  Past Surgical History Past Surgical History:  Procedure Laterality Date   ABDOMINAL HYSTERECTOMY     APPENDECTOMY     CHOLECYSTECTOMY     PANCREATECTOMY  2012   PARTIAL COLECTOMY  2012   SPLENECTOMY  2012  Incisional hernia repair Nissen Fundoplication  Social:  Lives with: Husband at home  Support: Good support in her husband  PCP: Landry Georgi, MD  Level of function: Independent in most ADLS and iADLs, cooks with her husband  Substances: No substance use  Occupation: Former Building surveyor  Family History:  History reviewed. No pertinent family history.   Allergies: Allergies as of 01/15/2024 - Review Complete 01/15/2024  Allergen Reaction Noted   Codeine Other (See Comments) 10/24/2010   Baclofen Swelling 08/31/2013   Carbamazepine  08/31/2013   Gabapentin   06/06/2012   Oxycodone -acetaminophen  Nausea And Vomiting 08/31/2013   Pregabalin  08/31/2013   Topiramate  06/06/2012     Review of Systems: A complete ROS was negative except as per HPI.   OBJECTIVE:   Physical Exam: Blood pressure (!) 159/87, pulse 87, temperature 98.5 F (36.9 C), temperature source Oral, resp. rate 12, SpO2 99%.  Constitutional: well-appearing female sitting in ED bed, in no acute distress although short of breath when speaking to us   HENT: normocephalic atraumatic, mucous membranes mildly dry, NTG in nose noted Eyes: conjunctiva non-erythematous Neck: supple Cardiovascular: regular rate and rhythm, no m/r/g Pulmonary/Chest: mildly labored work of breathing on  room air, lungs clear to auscultation bilaterally Abdominal: diffusely distended, tender to palp mostly in LLQ MSK: tender to palpation of left lateral ribs likely due to recent fall Neurological: alert & oriented x 3 Skin: warm and dry, right thenar eminence with >10 stiches mildly erythematous  Psych: appropriate mood  Labs: CBC    Component Value Date/Time   WBC 12.9 (H) 01/15/2024 1718   RBC 4.74 01/15/2024 1718   HGB 14.4 01/15/2024 1718   HGB 15.6 10/14/2006 1401   HCT 45.1 01/15/2024 1718   HCT 46.5 10/14/2006 1401   PLT 455 (H) 01/15/2024 1718   PLT 409 (H) 10/14/2006 1401   MCV 95.1 01/15/2024 1718   MCV 91.6 10/14/2006 1401   MCH 30.4 01/15/2024 1718   MCHC 31.9 01/15/2024 1718   RDW 14.1 01/15/2024 1718   RDW 11.1 (L) 10/14/2006 1401   LYMPHSABS 2.0 05/04/2023 1425   LYMPHSABS 2.9 10/14/2006 1401   MONOABS 1.0 05/04/2023 1425   MONOABS 0.5 10/14/2006 1401   EOSABS 0.6 (H) 05/04/2023 1425   EOSABS 0.2 10/14/2006 1401   BASOSABS 0.1 05/04/2023 1425   BASOSABS 0.1 10/14/2006 1401     CMP     Component Value Date/Time   NA 128 (L) 01/15/2024 1718   K 4.0 01/15/2024 1718   CL 97 (L) 01/15/2024 1718   CO2 18 (L) 01/15/2024 1718   GLUCOSE 174 (H) 01/15/2024 1718   BUN 16 01/15/2024 1718   CREATININE 1.26 (H) 01/15/2024 1718   CALCIUM 9.4 01/15/2024 1718   PROT 6.8 01/16/2024 0053   ALBUMIN   3.5 01/16/2024 0053   AST 28 01/16/2024 0053   ALT 19 01/16/2024 0053   ALKPHOS 85 01/16/2024 0053   BILITOT 0.8 01/16/2024 0053   GFRNONAA 42 (L) 01/15/2024 1718   GFRAA  10/23/2007 1248    >60        The eGFR has been calculated using the MDRD equation. This calculation has not been validated in all clinical    Imaging:  DG Abd Portable 1V-Small Bowel Protocol-Position Verification Result Date: 01/16/2024 EXAM: 1 VIEW XRAY OF THE ABDOMEN 01/16/2024 12:38:00 PM COMPARISON: CT of the abdomen and pelvis dated 07/18/2013. CLINICAL HISTORY: Encounter for imaging study to confirm nasogastric (NG) tube placement. FINDINGS: BOWEL: Mild-to-moderate distention of loops of mid small bowel. Bowel anastomosis sutures are noted within the left upper quadrant. SOFT TISSUES: Surgical clips are noted in the gallbladder fossa near the gastroesophageal junction. BONES: No acute osseous abnormality. LINES AND TUBES: Since the previous study, a gastric tube has been placed, but it is looped in the distal esophagus and terminates above the level of view. IMPRESSION: 1. Gastric tube looped in the distal esophagus and terminates above the level of view. 2. Mild-to-moderate distention of loops of mid small bowel. Electronically signed by: evalene coho 01/16/2024 12:44 PM EDT RP Workstation: [id number]   CT Angio Chest PE W and/or Wo Contrast Result Date: 01/16/2024 EXAM: CTA of the Chest with contrast for PE 01/16/2024 08:55:15 AM TECHNIQUE: CTA of the chest was performed after the administration of intravenous contrast. Multiplanar reformatted images are provided for review. MIP images are provided for review. Automated exposure control, iterative reconstruction, and/or weight based adjustment of the mA/kV was utilized to reduce the radiation dose to as low as reasonably achievable. COMPARISON: 04/24/2023 CLINICAL HISTORY: Pulmonary embolism (PE) suspected, low to intermediate prob, positive D-dimer; dysphagia, SOB.  Omni 350 75cc; reports choking on oral medication this morning. Pt thinks there is pill stuck  in esophagus. Pt able to talk. No resp distress. Pt has recurrent choking episode. FINDINGS: PULMONARY ARTERIES: Pulmonary arteries are adequately opacified for evaluation. No pulmonary embolism. Main pulmonary artery is normal in caliber. MEDIASTINUM: The heart and pericardium demonstrate no acute abnormality. There is no acute abnormality of the thoracic aorta. LYMPH NODES: Subcarinal lymph node measures 1.7 cm, image 169/5, unchanged from previous exam. Right paratracheal lymph node measures 1.2 cm, image 115/5. Previously 1.4 cm. Multiple prominent upper abdominal lymph nodes are identified with a similar appearance to the previous exam of unknown clinical significance. Index left periaortic node measures 1.3 cm, image 128/3. Previously 1 cm. Gastrohepatic ligament node measures 1.1 cm image 113/3. Previously 1 cm. LUNGS AND PLEURA: High-grade focal luminal narrowing of the right middle lobe bronchus is again noted and appears unchanged from the previous exam. Associated chronic partial atelectasis of the right middle lobe with volume loss. Chronic subsegmental atelectasis and volume loss involving the posterior left upper lobe and Lingula. Calcified granulomas identified within the apical segment of the right upper lobe. No pleural fluid or airspace consolidation. Diffuse bronchial wall thickening. There are scattered lung nodules identified bilaterally which have a nonspecific appearance the largest nodule is in the right lung base measuring 1 x 0.6 cm, image 94/4. On the previous exam, this measured 1.1 x 0.8 cm. Posterior left upper lobe lung nodule is unchanged measuring 0.4 cm, image 51/4. UPPER ABDOMEN: Postoperative changes at the GE junction possibly from prior fundoplication wrap. Gaseous distention of the stomach is noted, which results in asymmetric elevation of the left hemidiaphragm. Postoperative change  from prior distal pancreatectomy. SOFT TISSUES AND BONES: There are new fractures involving the anterolateral aspect of the left 8th and 9th ribs. Chronic right lateral rib fracture deformities are noted. IMPRESSION: 1. No evidence of pulmonary embolism. 2. High-grade focal luminal narrowing of the right middle lobe bronchus with associated chronic partial atelectasis and volume loss, unchanged from previous exam. 3. Scattered bilateral lung nodules, largest in the right lung base measuring 1 x 0.6 cm, previously 1.1 x 0.8 cm. Posterior left upper lobe nodule is unchanged at 0.4 cm. 4. Multiple prominent upper abdominal lymph nodes, similar to previous exam, of unknown clinical significance. Electronically signed by: Waddell Calk MD 01/16/2024 09:27 AM EDT RP Workstation: [id number]   CT ABDOMEN PELVIS W CONTRAST Result Date: 01/16/2024 EXAM: CT ABDOMEN AND PELVIS WITH CONTRAST 01/16/2024 08:55:15 AM TECHNIQUE: CT of the abdomen and pelvis was performed with the administration of intravenous contrast (75mL iohexol  (OMNIPAQUE ) 350 MG/ML injection). Multiplanar reformatted images are provided for review. Automated exposure control, iterative reconstruction, and/or weight based adjustment of the mA/kV was utilized to reduce the radiation dose to as low as reasonably achievable. COMPARISON: CT of the abdomen and pelvis dated 10/17/2000. CLINICAL HISTORY: Abdominal pain, acute, nonlocalized. Reports choking on oral medication this morning. Patient thinks there is pill stuck in esophagus. Patient able to talk. No respiratory distress. Patient has recurrent choking episode. FINDINGS: LOWER CHEST: Bronchiectasis and streaky peribronchovascular consolidation present within the base of the right middle lobe, as before. LIVER: The liver is unremarkable. GALLBLADDER AND BILE DUCTS: The patient is status post cholecystectomy. SPLEEN: The patient is status post splenectomy. PANCREAS: The patient is status post distal  pancreatectomy. ADRENAL GLANDS: No acute abnormality. KIDNEYS, URETERS AND BLADDER: No stones in the kidneys or ureters. No hydronephrosis. No perinephric or periureteral stranding. Urinary bladder is unremarkable. GI AND BOWEL: The distal sigmoid colon remains collapsed and featureless. The proximal  sigmoid colon remains mildly dilated with air and formed fecal material. The patient is status post partial left colectomy. There are loops of moderately distended mid to distal small bowel, which measure up to approximately 3.2 cm in diameter. There is no abrupt transition point. Findings are compatible with ileus or partial small bowel obstruction. PERITONEUM AND RETROPERITONEUM: The patient is again noted to be status post abdominal hernia repair. VASCULATURE: Moderate calcific coronary artery disease. LYMPH NODES: No lymphadenopathy. REPRODUCTIVE ORGANS: The patient is status post hysterectomy. There are no abnormal adnexal masses. BONES AND SOFT TISSUES: No acute osseous abnormality. No focal soft tissue abnormality. IMPRESSION: 1. Moderately distended mid to distal small bowel loops, measuring up to 3.2 cm in diameter, without an abrupt transition point, compatible with ileus or partial small bowel obstruction. 2. Collapsed and featureless distal sigmoid colon with mildly dilated proximal sigmoid colon containing air and formed fecal material. Electronically signed by: evalene berrigan 01/16/2024 09:21 AM EDT RP Workstation: [id number]   DG Chest 1 View Result Date: 01/15/2024 CLINICAL DATA:  Choking episode earlier today. Patient choked on oral medications. Feels like pills stuck in the esophagus. EXAM: CHEST  1 VIEW COMPARISON:  Chest and ribs 05/01/2023.  CT chest 05/04/2023 FINDINGS: Shallow inspiration with elevation of the left hemidiaphragm. Linear opacities in the lung bases are similar to the prior study, likely representing scarring. There is a circumscribed nodule projecting over the right lower lung.  This is also unchanged. No pleural effusion or pneumothorax. Mediastinal contours appear intact. Calcification of the aorta. Old rib fractures. No radiopaque foreign bodies are identified. IMPRESSION: Shallow inspiration. Chronic linear and nodular opacities in the lung bases as described. No evidence of active pulmonary disease. Electronically Signed   By: Elsie Gravely M.D.   On: 01/15/2024 19:41     ASSESSMENT & PLAN:   Assessment & Plan by Problem: Principal Problem:   Small bowel obstruction (HCC) Active Problems:   Essential hypertension   Gastroesophageal reflux disease without esophagitis   Diabetes mellitus secondary to pancreatic insufficiency (HCC)   Vomiting   Choking   Hyponatremia   Leukocytosis   Thrombocytosis   Status post skin and subcutaneous tissue surgery   AIYSHA JILLSON is a 83 y.o. person living with a history of pancreatic CA s/p pancreatectomy and nissen fundoplication, T2DM, sarcoidosis, and IBD who presented with choking and admitted for dysphagia and concern for SBO on hospital day 0  #Abdominal distention Partial small bowel obstruction vs ileus vs impaction vs IDB CTAP in ED indicates concern for ileus or partial small bowel obstruction. Patient in pain from abdominal distention on exam, worst pain on left side.  -Appreciate gen surg recommendations. Per note, NTG decompression followed by SBO protocol. Difficulty with NGT placement on admission, IR and gen surg consulted and will put in on 8/3  -NPO pending NGT, NS continuous infusion -LFTS in ED wnl  #Choking Esophageal dysmotility vs fundoplication complication vs esophageal stricture  Patient had nissen fundoplication surgery a couple years after pancreatectomy in 2002. Has had it re-operated on in the past. CTA in ED neg for PE.  -NPO, NS continuous infusion -PPI 40 mg  -Once NGT removed, consider swallow study  #T2DM Patient hospitalized for 17 days in January 2024 for DKA and PNA c/p  respiratory failure with 8 days of intubation and AKI possibly due to Bactrim . Glucose 174 on admission, patient states compliant with home meds including lantus  and -glitazone.  -CBG monitoring  -A1c -sliding scale insulin , glargine  5 units, aspart 0-9 units - given NPO status hold Pioglitazone    #Thrombocytosis Found to have thrombocytosis during DKA/PNA admission in 06/2022, thought to be reactive. Recent CBCs in 04/2023 plts wnl.  -CTM   #Hyponatremia 128 on admission, likely due to anorexia. Appears euvolemic, mouth mildly dry.  -NS continuous infusion -CTM  #BCC s/p excision Recently had BCC removed by derm. Derm had to reopen and excise three times to fully remove BCC. Took course of abx and restarted on another higher dose of abx by derm. Several stitches, appropriate healing, mildly erythematous -Monitor for signs of infection, consider topical mupirocin  until can restart abx   #Trigeminal neuralgia -Home meds: amitriptyline , duloxetine , gabapentin  300 mg BID  -Hold as pt NPO  #HLD -Home meds: atorvastation -Hold as pt NPO   Best practice: Diet: NPO VTE: Heparin  injections 5000units  IVF: NS,100cc/hr Code: Unknown, need to f/u. Full code ordered  Disposition planning: Prior to Admission Living Arrangement: Home, living with husband Anticipated Discharge Location: Home  Dispo: Admit patient to Inpatient with expected length of stay greater than 2 midnights.  Signed: Charmayne Holmes, DO Internal Medicine Resident  01/16/2024, 4:43 PM  Please contact IM Residency On-Call Pager at: [phone] or [phone].

## 2024-01-16 NOTE — ED Notes (Signed)
 Patient transported to CT

## 2024-01-16 NOTE — Hospital Course (Addendum)
 Patieent BOWEL obstutoin  Nothing surgical to do  Thought shee was choing  Dry heaving  Belly distended CT SBO ilues  BM since she had been here   Nasuea  Medications  She notes that she is tired but she is doing much better. She came in because she has been choking. She reports that she felt that one got stuck in her throat. This has happened before and usually is able to drink a little fluid and it can work. This time it did not water  and felt that she felt more distended. Husband tried to do the heimlich maneuver and it hurt her. Husband helped her to the bathroom and she kept dry heaving. She states that she then got out of breath and then wanted to come to the ED. She reports that she was having some esophageal spasms. She has had more burping. She has had surgery for her pancreatic cancer in the past. Had 3 surgeries in the past. She states that she noted the pain started here when she had the irritable bowel syndrome. She has had a spasm in her throat before. She describes it as a twist. This as happened to her before. She usually drinks a cocktail for the spasms. It numbs it a little bit. Does not need oxygen at home.  NO dizziness or lightheadedness. 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 She had to go 3 times.

## 2024-01-16 NOTE — ED Notes (Signed)
 Pt ambulated with EMT to restroom.

## 2024-01-16 NOTE — ED Provider Notes (Signed)
 Nicole Montgomery Provider Note   CSN: [id number] Arrival date & time: 01/15/24  1511     Patient presents with: Choking   Nicole Montgomery is a 83 y.o. female.   Patient with a history of hypertension, diabetes, pancreatic cancer, sarcoidosis presents with concern for esophageal spasm.  States she has a sensation of pills being stuck in her throat since about 10 AM yesterday.  She is having discomfort in her chest and feels like her esophagus is spasming.  Does have a history of esophageal dysmotility but denies any previous EGDs.  He has a history of esophageal wrap.  Nausea but no vomiting.  States the discomfort in her chest is making her short of breath.  Unable to eat or drink today secondary to discomfort.  No vomiting.  No cough or fever.  The history is provided by the patient.       Prior to Admission medications   Medication Sig Start Date End Date Taking? Authorizing Provider  acetaminophen  (TYLENOL ) 500 MG tablet Take 2 tablets (1,000 mg total) by mouth every 6 (six) hours as needed. 05/06/23   Tammy Sor, PA-C  amitriptyline  (ELAVIL ) 50 MG tablet Take 1 tablet (50 mg total) by mouth at bedtime. 07/04/22   Krishnan, Gokul, MD  aspirin  EC 81 MG tablet Take 81 mg by mouth daily. Swallow whole.    [provider]  B-D ULTRAFINE III SHORT PEN 31G X 8 MM MISC SMARTSIG:1 Pen Needle SUB-Q Daily 02/15/23   [provider]  DULoxetine  (CYMBALTA ) 60 MG capsule Take 60 mg by mouth daily. 04/08/22   [provider]  fluconazole (DIFLUCAN) 100 MG tablet Take 100 mg by mouth daily as needed.    [provider]  fluorouracil  (EFUDEX ) 5 % cream Apply 1 Application topically 2 (two) times daily. 04/15/23   [provider]  gabapentin  (NEURONTIN ) 300 MG capsule Take 300 mg by mouth 4 (four) times daily. Take with 600mg  Gabapentin     [provider]  gabapentin  (NEURONTIN ) 600 MG tablet Take 600 mg  by mouth 4 (four) times daily. Take with 300mg  Gabapentin  03/06/23   [provider]  insulin  glargine (LANTUS ) 100 UNIT/ML injection Inject 5 Units into the skin at bedtime.    [provider]  Melatonin Gummies 5 MG CHEW Chew 1 each by mouth at bedtime.    [provider]  methocarbamol  (ROBAXIN ) 500 MG tablet Take 1 tablet (500 mg total) by mouth every 8 (eight) hours as needed for muscle spasms. 05/06/23   Tammy Sor, PA-C  Multiple Vitamin (MULTIVITAMIN WITH MINERALS) TABS tablet Take 1 tablet by mouth daily. 07/04/22   Krishnan, Gokul, MD  ondansetron  (ZOFRAN ) 4 MG tablet Take 4 mg by mouth every 8 (eight) hours as needed for nausea or vomiting.    [provider]  oxyCODONE  (OXY IR/ROXICODONE ) 5 MG immediate release tablet Take 1 tablet (5 mg total) by mouth every 6 (six) hours as needed (pain). 05/06/23   Tammy Sor, PA-C  pioglitazone  (ACTOS ) 45 MG tablet Take 45 mg by mouth daily.    [provider]  polyethylene glycol powder (GLYCOLAX /MIRALAX ) powder Take 17 g by mouth daily.    [provider]  pravastatin  (PRAVACHOL ) 80 MG tablet Take 80 mg by mouth daily. 01/13/17   [provider]  Semaglutide,0.25 or 0.5MG /DOS, (OZEMPIC, 0.25 OR 0.5 MG/DOSE,) 2 MG/1.5ML SOPN Inject 0.5 mg into the skin once a week. On Tuesdays  [provider]  sulfamethoxazole -trimethoprim  (BACTRIM  DS) 800-160 MG tablet Take 1 tablet by mouth 2 (two) times daily. 04/28/23   [provider]    Allergies: Codeine, Baclofen, Carbamazepine, Gabapentin , Oxycodone -acetaminophen , Pregabalin, and Topiramate    Review of Systems  Constitutional:  Positive for activity change and appetite change.  HENT:  Negative for congestion and rhinorrhea.   Respiratory:  Positive for chest tightness.   Cardiovascular:  Negative for chest pain.  Gastrointestinal:  Positive for abdominal pain and nausea. Negative for vomiting.  Genitourinary:   Negative for dysuria and hematuria.  Musculoskeletal:  Negative for arthralgias and myalgias.  Skin:  Negative for rash.  Neurological:  Negative for dizziness, weakness and headaches.    all other systems are negative except as noted in the HPI and PMH.   Updated Vital Signs BP (!) 145/82   Pulse 85   Temp 98.6 F (37 C) (Oral)   Resp (!) 22   SpO2 100%   Physical Exam Vitals and nursing note reviewed.  Constitutional:      General: She is not in acute distress.    Appearance: She is well-developed.     Comments: Anxious, dyspneic with speaking  HENT:     Head: Normocephalic and atraumatic.     Mouth/Throat:     Pharynx: No oropharyngeal exudate.  Eyes:     Conjunctiva/sclera: Conjunctivae normal.     Pupils: Pupils are equal, round, and reactive to light.  Neck:     Comments: No meningismus. Cardiovascular:     Rate and Rhythm: Normal rate and regular rhythm.     Heart sounds: Normal heart sounds. No murmur heard. Pulmonary:     Effort: Pulmonary effort is normal. No respiratory distress.     Breath sounds: Normal breath sounds.  Chest:     Chest wall: No tenderness.  Abdominal:     Palpations: Abdomen is soft.     Tenderness: There is abdominal tenderness. There is no guarding or rebound.  Musculoskeletal:        General: No tenderness. Normal range of motion.     Cervical back: Normal range of motion and neck supple.  Skin:    General: Skin is warm.  Neurological:     Mental Status: She is alert and oriented to person, place, and time.     Cranial Nerves: No cranial nerve deficit.     Motor: No abnormal muscle tone.     Coordination: Coordination normal.     Comments:  5/5 strength throughout. CN 2-12 intact.Equal grip strength.   Psychiatric:        Behavior: Behavior normal.     (all labs ordered are listed, but only abnormal results are displayed) Labs Reviewed  CBC - Abnormal; Notable for the following components:      Result Value   WBC 12.9 (*)     Platelets 455 (*)    All other components within normal limits  BASIC METABOLIC PANEL WITH GFR - Abnormal; Notable for the following components:   Sodium 128 (*)    Chloride 97 (*)    CO2 18 (*)    Glucose, Bld 174 (*)    Creatinine, Ser 1.26 (*)    GFR, Estimated 42 (*)    All other components within normal limits  HEPATIC FUNCTION PANEL  LIPASE, BLOOD  TROPONIN I (HIGH SENSITIVITY)  TROPONIN I (HIGH SENSITIVITY)    EKG: None  Radiology: DG Chest 1 View Result Date: 01/15/2024 CLINICAL DATA:  Choking episode  earlier today. Patient choked on oral medications. Feels like pills stuck in the esophagus. EXAM: CHEST  1 VIEW COMPARISON:  Chest and ribs 05/01/2023.  CT chest 05/04/2023 FINDINGS: Shallow inspiration with elevation of the left hemidiaphragm. Linear opacities in the lung bases are similar to the prior study, likely representing scarring. There is a circumscribed nodule projecting over the right lower lung. This is also unchanged. No pleural effusion or pneumothorax. Mediastinal contours appear intact. Calcification of the aorta. Old rib fractures. No radiopaque foreign bodies are identified. IMPRESSION: Shallow inspiration. Chronic linear and nodular opacities in the lung bases as described. No evidence of active pulmonary disease. Electronically Signed   By: Elsie Gravely M.D.   On: 01/15/2024 19:41     Procedures   Medications Ordered in the ED  glucagon  (human recombinant) (GLUCAGEN ) injection 1 mg (1 mg Intravenous Given 01/16/24 0137)  LORazepam  (ATIVAN ) injection 0.5 mg (0.5 mg Intravenous Given 01/16/24 0138)  lactated ringers  bolus 1,000 mL (1,000 mLs Intravenous New Bag/Given 01/16/24 0137)                                    Medical Decision Making Amount and/or Complexity of Data Reviewed Labs: ordered. Decision-making details documented in ED Course. Radiology: ordered and independent interpretation performed. Decision-making details documented in ED  Course. ECG/medicine tests: ordered and independent interpretation performed. Decision-making details documented in ED Course.  Risk Prescription drug management.   Sensation of something stuck in her throat and esophagus.  Nausea without vomiting.  Some dyspnea with conversation.  No hypoxia or increased work of breathing.  Clear lungs.  Will give IV fluids, glucagon  and Ativan .  No EGDs in system.  Chest x-ray shows shallow inspiration with chronic opacities  Feels improved with above treatment. Able to swallow some water .  EKG without acute ST changes. Troponin negative.  Labs with mild hyponatremia. Will hydrate. Mild leukocytosis.   Given her SOB with difficulty swallowing with abdominal pain, CT imaging will be pursued.   Troponin negative x2. Low suspicion for ACS. CTPE and CT a/p pending at shift change.   Low suspicion for acute esophageal food impaction as she is able to tolerate PO without vomiting.   Care transferred at shift change pending CT scan. Anticipate outpatient followup with GI if results reassuring. Dr. Ginger to assume care.   ED ECG REPORT   Date: 01/16/2024  Rate: 83  Rhythm: normal sinus rhythm  QRS Axis: normal  Intervals: normal  ST/T Wave abnormalities: normal  Conduction Disutrbances:none  Narrative Interpretation:   Old EKG Reviewed: none available  I have personally reviewed the EKG tracing and agree with the computerized printout as noted.      Final diagnoses:  None    ED Discharge Orders     None          Gradie Butrick, Garnette, MD 01/16/24 [phone]

## 2024-01-16 NOTE — ED Notes (Signed)
 Admitting to place order for fluoro NG placement due to NG tube coiling

## 2024-01-16 NOTE — Plan of Care (Signed)
  Problem: Coping: Goal: Ability to adjust to condition or change in health will improve Outcome: Progressing   Problem: Fluid Volume: Goal: Ability to maintain a balanced intake and output will improve Outcome: Progressing   Problem: Metabolic: Goal: Ability to maintain appropriate glucose levels will improve Outcome: Progressing   Problem: Nutritional: Goal: Progress toward achieving an optimal weight will improve Outcome: Progressing   Problem: Skin Integrity: Goal: Risk for impaired skin integrity will decrease Outcome: Progressing   Problem: Clinical Measurements: Goal: Will remain free from infection Outcome: Progressing Goal: Diagnostic test results will improve Outcome: Progressing   Problem: Nutrition: Goal: Adequate nutrition will be maintained Outcome: Progressing   Problem: Coping: Goal: Level of anxiety will decrease Outcome: Progressing   Problem: Pain Managment: Goal: General experience of comfort will improve and/or be controlled Outcome: Progressing   Problem: Skin Integrity: Goal: Risk for impaired skin integrity will decrease Outcome: Progressing

## 2024-01-16 NOTE — ED Notes (Signed)
 Admitting team notified of NG tube placement difficulty.

## 2024-01-16 NOTE — ED Notes (Signed)
 Per IR PA, plan for NG insertion tomorrow. Admitting MD notified.

## 2024-01-16 NOTE — Plan of Care (Signed)
  Problem: Pain Managment: Goal: General experience of comfort will improve and/or be controlled Outcome: Progressing   Problem: Safety: Goal: Ability to remain free from injury will improve Outcome: Progressing

## 2024-01-17 ENCOUNTER — Inpatient Hospital Stay (HOSPITAL_COMMUNITY)

## 2024-01-17 LAB — COMPREHENSIVE METABOLIC PANEL WITH GFR
ALT: 17 U/L (ref 0–44)
AST: 27 U/L (ref 15–41)
Albumin: 3.1 g/dL — ABNORMAL LOW (ref 3.5–5.0)
Alkaline Phosphatase: 75 U/L (ref 38–126)
Anion gap: 16 — ABNORMAL HIGH (ref 5–15)
BUN: 15 mg/dL (ref 8–23)
CO2: 15 mmol/L — ABNORMAL LOW (ref 22–32)
Calcium: 8.8 mg/dL — ABNORMAL LOW (ref 8.9–10.3)
Chloride: 108 mmol/L (ref 98–111)
Creatinine, Ser: 0.96 mg/dL (ref 0.44–1.00)
GFR, Estimated: 59 mL/min — ABNORMAL LOW (ref >60)
Glucose, Bld: 135 mg/dL — ABNORMAL HIGH (ref 70–99)
Potassium: 3.3 mmol/L — ABNORMAL LOW (ref 3.5–5.1)
Sodium: 139 mmol/L (ref 135–145)
Total Bilirubin: 1.1 mg/dL (ref 0.0–1.2)
Total Protein: 6.1 g/dL — ABNORMAL LOW (ref 6.5–8.1)

## 2024-01-17 LAB — CBC
HCT: 41.8 % (ref 36.0–46.0)
Hemoglobin: 13.2 g/dL (ref 12.0–15.0)
MCH: 29.6 pg (ref 26.0–34.0)
MCHC: 31.6 g/dL (ref 30.0–36.0)
MCV: 93.7 fL (ref 80.0–100.0)
Platelets: 454 K/uL — ABNORMAL HIGH (ref 150–400)
RBC: 4.46 MIL/uL (ref 3.87–5.11)
RDW: 14.4 % (ref 11.5–15.5)
WBC: 11.8 K/uL — ABNORMAL HIGH (ref 4.0–10.5)
nRBC: 0 % (ref 0.0–0.2)

## 2024-01-17 LAB — GLUCOSE, CAPILLARY
Glucose-Capillary: 113 mg/dL — ABNORMAL HIGH (ref 70–99)
Glucose-Capillary: 116 mg/dL — ABNORMAL HIGH (ref 70–99)
Glucose-Capillary: 116 mg/dL — ABNORMAL HIGH (ref 70–99)
Glucose-Capillary: 127 mg/dL — ABNORMAL HIGH (ref 70–99)
Glucose-Capillary: 137 mg/dL — ABNORMAL HIGH (ref 70–99)
Glucose-Capillary: 72 mg/dL (ref 70–99)

## 2024-01-17 MED ORDER — DIATRIZOATE MEGLUMINE & SODIUM 66-10 % PO SOLN
90.0000 mL | Freq: Once | ORAL | Status: DC
  Filled 2024-01-17: qty 90

## 2024-01-17 MED ORDER — ACETAMINOPHEN 325 MG PO TABS
650.0000 mg | ORAL_TABLET | Freq: Four times a day (QID) | ORAL | Status: DC | PRN
  Administered 2024-01-17: 650 mg via ORAL
  Filled 2024-01-17: qty 2

## 2024-01-17 MED ORDER — POTASSIUM CHLORIDE 10 MEQ/100ML IV SOLN
10.0000 meq | INTRAVENOUS | Status: AC
  Administered 2024-01-17 (×4): 10 meq via INTRAVENOUS
  Filled 2024-01-17 (×4): qty 100

## 2024-01-17 NOTE — Progress Notes (Signed)
 HD#1 SUBJECTIVE:  Patient Summary: Nicole Montgomery is a 83 y.o. person living with a history of pancreatic CA s/p pancreatectomy and nissen fundoplication, T2DM, sarcoidosis, and IBD who presented with choking and admitted for dysphagia and concern for SBO    Overnight Events: none  Interim History: Patient doing much better today. She feels more tired because she didn't sleep well last night. She had a BM this morning. She does not have abdominal pain and denies SOB. She's able to swallow and has an appetite.  Discussed gen surg plan to get more imaging to decide the best next steps.   OBJECTIVE:  Vital Signs: Vitals:   01/16/24 1620 01/16/24 2026 01/17/24 0009 01/17/24 0437  BP: (!) 147/78 130/75 (!) 141/82 (!) 154/84  Pulse: 88 85 80 81  Resp: 17 17 16 18   Temp: 98.1 F (36.7 C) 98.3 F (36.8 C) 98.2 F (36.8 C) 98 F (36.7 C)  TempSrc: Oral Oral Oral Oral  SpO2: 98% 97% 97% 96%   Supplemental O2: Room Air SpO2: 96 %  There were no vitals filed for this visit.   Intake/Output Summary (Last 24 hours) at 01/17/2024 0657 Last data filed at 01/17/2024 0300 Gross per 24 hour  Intake 242.08 ml  Output --  Net 242.08 ml   Net IO Since Admission: 1,242.47 mL [01/17/24 0657]  Physical Exam: Physical Exam Constitutional:      General: She is not in acute distress.    Appearance: Normal appearance. She is not toxic-appearing or diaphoretic.  HENT:     Nose: No congestion or rhinorrhea.     Mouth/Throat:     Mouth: Mucous membranes are moist.     Pharynx: Oropharynx is clear.  Eyes:     Extraocular Movements: Extraocular movements intact.     Conjunctiva/sclera: Conjunctivae normal.  Cardiovascular:     Heart sounds: Normal heart sounds.  Pulmonary:     Effort: Pulmonary effort is normal.     Breath sounds: Normal breath sounds.  Abdominal:     General: Abdomen is flat. Bowel sounds are normal. There is no distension.     Palpations: Abdomen is soft.     Tenderness:  There is no abdominal tenderness. There is no guarding.  Musculoskeletal:     Right lower leg: No edema.     Left lower leg: No edema.  Skin:    General: Skin is warm and dry.  Neurological:     General: No focal deficit present.     Mental Status: She is alert and oriented to person, place, and time.  Psychiatric:        Mood and Affect: Mood normal.     Patient Lines/Drains/Airways Status     Active Line/Drains/Airways     Name Placement date Placement time Site Days   Peripheral IV 01/16/24 22 G 1.75 Anterior;Left Forearm 01/16/24  0122  Forearm  1            Pertinent labs and imaging:      Latest Ref Rng & Units 01/15/2024    5:18 PM 05/06/2023    7:31 AM 05/05/2023   10:02 AM  CBC  WBC 4.0 - 10.5 K/uL 12.9  10.5  12.0   Hemoglobin 12.0 - 15.0 g/dL 85.5  88.2  88.6   Hematocrit 36.0 - 46.0 % 45.1  37.1  36.2   Platelets 150 - 400 K/uL 455  386  381        Latest Ref Rng &  Units 01/16/2024   12:53 AM 01/15/2024    5:18 PM 05/06/2023    7:31 AM  CMP  Glucose 70 - 99 mg/dL  825  843   BUN 8 - 23 mg/dL  16  25   Creatinine 9.55 - 1.00 mg/dL  8.73  8.92   Sodium 864 - 145 mmol/L  128  137   Potassium 3.5 - 5.1 mmol/L  4.0  4.5   Chloride 98 - 111 mmol/L  97  107   CO2 22 - 32 mmol/L  18  23   Calcium 8.9 - 10.3 mg/dL  9.4  8.6   Total Protein 6.5 - 8.1 g/dL 6.8     Total Bilirubin 0.0 - 1.2 mg/dL 0.8     Alkaline Phos 38 - 126 U/L 85     AST 15 - 41 U/L 28     ALT 0 - 44 U/L 19       DG Abd Portable 1V-Small Bowel Protocol-Position Verification Result Date: 01/16/2024 EXAM: 1 VIEW XRAY OF THE ABDOMEN 01/16/2024 12:38:00 PM COMPARISON: CT of the abdomen and pelvis dated 07/18/2013. CLINICAL HISTORY: Encounter for imaging study to confirm nasogastric (NG) tube placement. FINDINGS: BOWEL: Mild-to-moderate distention of loops of mid small bowel. Bowel anastomosis sutures are noted within the left upper quadrant. SOFT TISSUES: Surgical clips are noted in the  gallbladder fossa near the gastroesophageal junction. BONES: No acute osseous abnormality. LINES AND TUBES: Since the previous study, a gastric tube has been placed, but it is looped in the distal esophagus and terminates above the level of view. IMPRESSION: 1. Gastric tube looped in the distal esophagus and terminates above the level of view. 2. Mild-to-moderate distention of loops of mid small bowel. Electronically signed by: evalene coho 01/16/2024 12:44 PM EDT RP Workstation: [id number]   CT Angio Chest PE W and/or Wo Contrast Result Date: 01/16/2024 EXAM: CTA of the Chest with contrast for PE 01/16/2024 08:55:15 AM TECHNIQUE: CTA of the chest was performed after the administration of intravenous contrast. Multiplanar reformatted images are provided for review. MIP images are provided for review. Automated exposure control, iterative reconstruction, and/or weight based adjustment of the mA/kV was utilized to reduce the radiation dose to as low as reasonably achievable. COMPARISON: 04/24/2023 CLINICAL HISTORY: Pulmonary embolism (PE) suspected, low to intermediate prob, positive D-dimer; dysphagia, SOB. Omni 350 75cc; reports choking on oral medication this morning. Pt thinks there is pill stuck in esophagus. Pt able to talk. No resp distress. Pt has recurrent choking episode. FINDINGS: PULMONARY ARTERIES: Pulmonary arteries are adequately opacified for evaluation. No pulmonary embolism. Main pulmonary artery is normal in caliber. MEDIASTINUM: The heart and pericardium demonstrate no acute abnormality. There is no acute abnormality of the thoracic aorta. LYMPH NODES: Subcarinal lymph node measures 1.7 cm, image 169/5, unchanged from previous exam. Right paratracheal lymph node measures 1.2 cm, image 115/5. Previously 1.4 cm. Multiple prominent upper abdominal lymph nodes are identified with a similar appearance to the previous exam of unknown clinical significance. Index left periaortic node measures 1.3  cm, image 128/3. Previously 1 cm. Gastrohepatic ligament node measures 1.1 cm image 113/3. Previously 1 cm. LUNGS AND PLEURA: High-grade focal luminal narrowing of the right middle lobe bronchus is again noted and appears unchanged from the previous exam. Associated chronic partial atelectasis of the right middle lobe with volume loss. Chronic subsegmental atelectasis and volume loss involving the posterior left upper lobe and Lingula. Calcified granulomas identified within the apical segment of the right upper  lobe. No pleural fluid or airspace consolidation. Diffuse bronchial wall thickening. There are scattered lung nodules identified bilaterally which have a nonspecific appearance the largest nodule is in the right lung base measuring 1 x 0.6 cm, image 94/4. On the previous exam, this measured 1.1 x 0.8 cm. Posterior left upper lobe lung nodule is unchanged measuring 0.4 cm, image 51/4. UPPER ABDOMEN: Postoperative changes at the GE junction possibly from prior fundoplication wrap. Gaseous distention of the stomach is noted, which results in asymmetric elevation of the left hemidiaphragm. Postoperative change from prior distal pancreatectomy. SOFT TISSUES AND BONES: There are new fractures involving the anterolateral aspect of the left 8th and 9th ribs. Chronic right lateral rib fracture deformities are noted. IMPRESSION: 1. No evidence of pulmonary embolism. 2. High-grade focal luminal narrowing of the right middle lobe bronchus with associated chronic partial atelectasis and volume loss, unchanged from previous exam. 3. Scattered bilateral lung nodules, largest in the right lung base measuring 1 x 0.6 cm, previously 1.1 x 0.8 cm. Posterior left upper lobe nodule is unchanged at 0.4 cm. 4. Multiple prominent upper abdominal lymph nodes, similar to previous exam, of unknown clinical significance. Electronically signed by: Waddell Calk MD 01/16/2024 09:27 AM EDT RP Workstation: [id number]   CT ABDOMEN PELVIS W  CONTRAST Result Date: 01/16/2024 EXAM: CT ABDOMEN AND PELVIS WITH CONTRAST 01/16/2024 08:55:15 AM TECHNIQUE: CT of the abdomen and pelvis was performed with the administration of intravenous contrast (75mL iohexol  (OMNIPAQUE ) 350 MG/ML injection). Multiplanar reformatted images are provided for review. Automated exposure control, iterative reconstruction, and/or weight based adjustment of the mA/kV was utilized to reduce the radiation dose to as low as reasonably achievable. COMPARISON: CT of the abdomen and pelvis dated 10/17/2000. CLINICAL HISTORY: Abdominal pain, acute, nonlocalized. Reports choking on oral medication this morning. Patient thinks there is pill stuck in esophagus. Patient able to talk. No respiratory distress. Patient has recurrent choking episode. FINDINGS: LOWER CHEST: Bronchiectasis and streaky peribronchovascular consolidation present within the base of the right middle lobe, as before. LIVER: The liver is unremarkable. GALLBLADDER AND BILE DUCTS: The patient is status post cholecystectomy. SPLEEN: The patient is status post splenectomy. PANCREAS: The patient is status post distal pancreatectomy. ADRENAL GLANDS: No acute abnormality. KIDNEYS, URETERS AND BLADDER: No stones in the kidneys or ureters. No hydronephrosis. No perinephric or periureteral stranding. Urinary bladder is unremarkable. GI AND BOWEL: The distal sigmoid colon remains collapsed and featureless. The proximal sigmoid colon remains mildly dilated with air and formed fecal material. The patient is status post partial left colectomy. There are loops of moderately distended mid to distal small bowel, which measure up to approximately 3.2 cm in diameter. There is no abrupt transition point. Findings are compatible with ileus or partial small bowel obstruction. PERITONEUM AND RETROPERITONEUM: The patient is again noted to be status post abdominal hernia repair. VASCULATURE: Moderate calcific coronary artery disease. LYMPH NODES: No  lymphadenopathy. REPRODUCTIVE ORGANS: The patient is status post hysterectomy. There are no abnormal adnexal masses. BONES AND SOFT TISSUES: No acute osseous abnormality. No focal soft tissue abnormality. IMPRESSION: 1. Moderately distended mid to distal small bowel loops, measuring up to 3.2 cm in diameter, without an abrupt transition point, compatible with ileus or partial small bowel obstruction. 2. Collapsed and featureless distal sigmoid colon with mildly dilated proximal sigmoid colon containing air and formed fecal material. Electronically signed by: evalene berrigan 01/16/2024 09:21 AM EDT RP Workstation: [id number]    ASSESSMENT/PLAN:  Assessment: Principal Problem:   Small bowel obstruction (  HCC) Active Problems:   Essential hypertension   Gastroesophageal reflux disease without esophagitis   Diabetes mellitus secondary to pancreatic insufficiency (HCC)   Vomiting   Choking   Hyponatremia   Leukocytosis   Thrombocytosis   Status post skin and subcutaneous tissue surgery   Plan: Nicole Montgomery is a 83 y.o. person living with a history of pancreatic CA s/p pancreatectomy and nissen fundoplication, T2DM, sarcoidosis, and IBD who presented with choking and admitted for dysphagia and concern for SBO on hospital day 1.    #Abdominal distention Partial small bowel obstruction vs ileus vs impaction vs IDB CTAP in ED indicates concern for ileus or partial small bowel obstruction. Denies abdominal pain or distention today. Had bowel movements this morning. -Appreciate gen surg recommendations. Repeat Xray demonstrated persistent dilated small bowel thus PO gastrografin  being administered 8/3 -NS continuous infusion, cont NPO - Ondansetron , hydromorphone , acetaminophen  prn -LFTS in ED wnl -K low 3.3, gave Kcl on 8/3 -PT OT ordered   #Choking Esophageal dysmotility vs fundoplication complication vs esophageal stricture  Patient had nissen fundoplication surgery a couple years  after pancreatectomy in 2002. Has had it re-operated on in the past. CTA in ED neg for PE. Patient denies N/V today and states she can swallow without difficulty.  -NPO, NS continuous infusion -PPI 40 mg    #T2DM Patient hospitalized for 17 days in January 2024 for DKA and PNA c/p respiratory failure with 8 days of intubation and AKI possibly due to Bactrim . Glucose 174 on admission, patient states compliant with home meds including lantus  and -glitazone.  -CBG monitoring  -A1c -sliding scale insulin , glargine 5 units, aspart 0-9 units -cont hold Pioglitazone  until not NPO   #Thrombocytosis Found to have thrombocytosis during DKA/PNA admission in 06/2022, thought to be reactive. Recent CBCs in 04/2023 plts wnl.  -CTM    #Hyponatremia 128 on admission, likely due to anorexia. Appears euvolemic, mouth mildly dry.  -NS continuous infusion -139 on 8/3, CTM   #BCC s/p excision Recently had BCC removed by derm. Derm had to reopen and excise three times to fully remove BCC. Took course of abx and restarted on another higher dose of abx by derm. Several stitches, appropriate healing, mildly erythematous -Monitor for signs of infection, topical mupirocin  until can restart abx    #Trigeminal neuralgia -Home meds: amitriptyline , duloxetine , gabapentin  300 mg BID  -Hold as pt NPO   #HLD -Home meds: atorvastation -Hold as pt NPO   Best Practice: Diet: Diabetic diet IVF: Fluids: 0.9NS,  VTE: heparin  injection 5,000 Units Start: 01/16/24 1400 Code: Full  Disposition planning: Therapy Recs: None, DME: none Family Contact: Husband, patietn said she would call and update DISPO: Anticipated discharge tomorrow to Home pending GI work up.  Signature:  Viktoria Charmayne [facility]  6:57 AM, 01/17/2024  On Call pager [phone]

## 2024-01-17 NOTE — Evaluation (Signed)
 Occupational Therapy Evaluation Patient Details Name: Nicole Montgomery MRN: [id number] DOB: [birth year] Today's Date: 01/17/2024   History of Present Illness   Nicole Montgomery is a 83 y.o. female presented to [facility] ED 01/15/24 with trouble swallowing. CTAP in ED indicates concern for ileus or partial small bowel obstruction. PMHx: pancreatic adeno s/p partial pancreatectomy (2002), fundoplication (2005), sarcoidosis, trigeminal neuralgia, IBS, basal cell skin cancers, T2DM, HTN, and HLD.     Clinical Impressions At baseline, pt is largely Ind to Mod I with ADLs, IADLs, and functional mobility with a RW or SPC. Pt now presents with decreased activity tolerance, generalized B UE weakness, decreased balance, and decreased safety and independence with functional tasks. Pt currently demonstrates ability to complete ADLs largely with Set up to Contact guard assist and functional mobility/transfers with a RW with close Supervision to Contact guard assist. Pt participated well in session, is motivated to return to PLOF, and has good family support. Pt will benefit from acute skilled OT services to address deficits and increase safety and independence with functional tasks. Post acute discharge, pt will benefit from [facility] OT to maximize rehab potential, decrease risk of falls, and decrease risk of rehospitalization.     If plan is discharge home, recommend the following:   A little help with walking and/or transfers;A little help with bathing/dressing/bathroom;Assistance with cooking/housework;Assist for transportation;Help with stairs or ramp for entrance;Direct supervision/assist for medications management;Direct supervision/assist for financial management     Functional Status Assessment   Patient has had a recent decline in their functional status and demonstrates the ability to make significant improvements in function in a reasonable and predictable amount of time.     Equipment Recommendations   None  recommended by OT (Pt already has needed equipment)     Recommendations for Other Services         Precautions/Restrictions   Precautions Precautions: Fall Recall of Precautions/Restrictions: Intact Restrictions Weight Bearing Restrictions Per Provider Order: No     Mobility Bed Mobility Overal bed mobility: Needs Assistance Bed Mobility: Supine to Sit     Supine to sit: Supervision     General bed mobility comments: Supervision for safety    Transfers Overall transfer level: Needs assistance Equipment used: Rolling walker (2 wheels) Transfers: Sit to/from Stand, Bed to chair/wheelchair/BSC Sit to Stand: Supervision     Step pivot transfers: Supervision, Contact guard assist     General transfer comment: close Supervision to CGA for safety      Balance Overall balance assessment: Needs assistance, History of Falls Sitting-balance support: Single extremity supported, No upper extremity supported, Feet supported Sitting balance-Leahy Scale: Fair   Postural control: Posterior lean (dynamic sitting) Standing balance support: Single extremity supported, Bilateral upper extremity supported, During functional activity, Reliant on assistive device for balance, No upper extremity supported Standing balance-Leahy Scale: Fair Standing balance comment: pt able to maintain static stand; requires UE support for dynamic stand and ambulation                           ADL either performed or assessed with clinical judgement   ADL Overall ADL's : Needs assistance/impaired Eating/Feeding: Set up;Sitting   Grooming: Contact guard assist;Standing   Upper Body Bathing: Supervision/ safety;Set up;Sitting   Lower Body Bathing: Contact guard assist;Sit to/from stand;Sitting/lateral leans;Cueing for safety   Upper Body Dressing : Set up;Sitting   Lower Body Dressing: Supervision/safety;Contact guard assist;Sitting/lateral leans;Sit to/from stand;Cueing for  safety  Toilet Transfer: Supervision/safety;Contact guard assist;Rolling walker (2 wheels);Regular Toilet;Grab bars;Cueing for safety   Toileting- Clothing Manipulation and Hygiene: Set up;Sitting/lateral lean;Contact guard assist;Sit to/from stand       Functional mobility during ADLs: Supervision/safety;Contact guard assist;Rolling walker (2 wheels);Cueing for safety General ADL Comments: Pt with decreased activity tolerance     Vision Baseline Vision/History: 1 Wears glasses Ability to See in Adequate Light: 1 Impaired (glasses not present in room this session) Patient Visual Report: No change from baseline       Perception         Praxis         Pertinent Vitals/Pain Pain Assessment Pain Assessment: No/denies pain     Extremity/Trunk Assessment Upper Extremity Assessment Upper Extremity Assessment: Defer to OT evaluation RUE Deficits / Details: incision and stiches on dorsal side of hand near thumb due to recent removal of basal cell skin cancer with incision clean and dry and pt reporting no pain.   Lower Extremity Assessment Lower Extremity Assessment: Generalized weakness   Cervical / Trunk Assessment Cervical / Trunk Assessment: Normal   Communication Communication Communication: Impaired Factors Affecting Communication: Hearing impaired   Cognition Arousal: Alert Behavior During Therapy: WFL for tasks assessed/performed, Impulsive (Occasionally impulsive with movement) Cognition: Cognition impaired, No family/caregiver present to determine baseline     Awareness: Intellectual awareness intact, Online awareness impaired (intermittent online awareness) Memory impairment (select all impairments): Working memory Attention impairment (select first level of impairment): Alternating attention Executive functioning impairment (select all impairments): Problem solving, Organization OT - Cognition Comments: Pt AAOx4 and pleasant throughout session. Cognition  largely [facility] for tasks assessed but with noted deficits above.                 Following commands: Intact       Cueing  General Comments   Cueing Techniques: Verbal cues;Visual cues      Exercises     Shoulder Instructions      Home Living Family/patient expects to be discharged to:: Private residence Living Arrangements: Spouse/significant other;Other relatives (two young great-grandsons are currently staying with pt and her husband for a few weeks before school starts) Available Help at Discharge: Family;Available 24 hours/day Type of Home: House Home Access: Stairs to enter [employer] of Steps: 5 Entrance Stairs-Rails: Right;Left;Can reach both Home Layout: One level     Bathroom Shower/Tub: Chief Strategy Officer: Standard     Home Equipment: Agricultural consultant (2 wheels);Cane - single point;Hand held shower head;Grab bars - tub/shower   Additional Comments: Pt reports she typically takes baths instead of showers      Prior Functioning/Environment Prior Level of Function : Independent/Modified Independent;Driving;History of Falls (last six months)             Mobility Comments: Mod I with RW or SPC; pt reports 2 falls and several near falls in past year and reports dizziness if she looks up when walking ADLs Comments: Largely Ind to Mod I with ADLs and IADLs with intermittent CGA from husband to get in/out of tub. Pt drives. Pt enjoys spending time with her great-grandchildren and doing crafts, including Copy and crochet.    OT Problem List: Decreased strength;Decreased activity tolerance;Impaired balance (sitting and/or standing);Decreased safety awareness;Decreased knowledge of use of DME or AE;Decreased knowledge of precautions   OT Treatment/Interventions: Self-care/ADL training;Therapeutic exercise;Energy conservation;DME and/or AE instruction;Therapeutic activities;Patient/family education;Balance training;Cognitive  remediation/compensation      OT Goals(Current goals can be found in the care plan section)  Acute Rehab OT Goals Patient Stated Goal: to return home and spend time with her great-grandsons OT Goal Formulation: With patient Time For Goal Achievement: 01/31/24 Potential to Achieve Goals: Good ADL Goals Pt Will Perform Grooming: with modified independence;standing Pt Will Perform Lower Body Bathing: with modified independence;sitting/lateral leans;sit to/from stand Pt Will Perform Lower Body Dressing: with modified independence;sitting/lateral leans;sit to/from stand Pt Will Transfer to Toilet: with modified independence;ambulating;regular height toilet (with least restrictive AD) Pt Will Perform Toileting - Clothing Manipulation and hygiene: with modified independence;sitting/lateral leans;sit to/from stand Additional ADL Goal #1: Patient will demonstrate ability to Independently state 3 energy conservation strategies to increase safety and independence with functional tasks with handout provided.   OT Frequency:  Min 2X/week    Co-evaluation              AM-PAC OT 6 Clicks Daily Activity     Outcome Measure Help from another person eating meals?: A Little Help from another person taking care of personal grooming?: A Little Help from another person toileting, which includes using toliet, bedpan, or urinal?: A Little Help from another person bathing (including washing, rinsing, drying)?: A Little Help from another person to put on and taking off regular upper body clothing?: A Little Help from another person to put on and taking off regular lower body clothing?: A Little 6 Click Score: 18   End of Session Equipment Utilized During Treatment: Gait belt;Rolling walker (2 wheels) Nurse Communication: Mobility status  Activity Tolerance: Patient tolerated treatment well Patient left: in chair;with call bell/phone within reach;with chair alarm set  OT Visit Diagnosis:  Unsteadiness on feet (R26.81);Other abnormalities of gait and mobility (R26.89);History of falling (Z91.81);Muscle weakness (generalized) (M62.81)                Time: 8577-8551 OT Time Calculation (min): 26 min Charges:  OT General Charges $OT Visit: 1 Visit OT Evaluation $OT Eval Moderate Complexity: 1 Mod  Margarie Rockey HERO., OTR/L, MA Acute Rehab [phone]   Margarie FORBES Horns 01/17/2024, 5:17 PM

## 2024-01-17 NOTE — Progress Notes (Signed)
 Pt did not tolerate 2nd bottle of Gastrografin , she vomited and she felt she's choking. Zofran  given, vomiting improved. Pt refused to continue with the remaining Gastrografin  even with zofran . Radio and MD are notified via secured chat. Pt had BM late afternoon.

## 2024-01-17 NOTE — Progress Notes (Signed)
 Progress Note     Subjective: Pt reports several BMs overnight and passing some flatus as well. No further wretching or vomiting. Mild nausea this AM that she feels is more related to hunger. Abdomen is non-tender and not bloated.   Objective: Vital signs in last 24 hours: Temp:  [98 F (36.7 C)-98.6 F (37 C)] 98 F (36.7 C) (08/03 0845) Pulse Rate:  [79-92] 79 (08/03 0845) Resp:  [12-25] 18 (08/03 0845) BP: (128-159)/(73-87) 141/73 (08/03 0845) SpO2:  [92 %-100 %] 98 % (08/03 0845) Last BM Date : 01/16/24  Intake/Output from previous day: 08/02 0701 - 08/03 0700 In: 242.1 [P.O.:240; I.V.:2.1] Out: -  Intake/Output this shift: No intake/output data recorded.  PE: General: pleasant, WD, elderly female who is laying in bed in NAD Heart: regular, rate, and rhythm.   Lungs: Respiratory effort nonlabored Abd: soft, NT, ND Psych: A&Ox3 with an appropriate affect.    Lab Results:  Recent Labs    01/15/24 1718 01/17/24 0657  WBC 12.9* 11.8*  HGB 14.4 13.2  HCT 45.1 41.8  PLT 455* 454*   BMET Recent Labs    01/15/24 1718 01/17/24 0657  NA 128* 139  K 4.0 3.3*  CL 97* 108  CO2 18* 15*  GLUCOSE 174* 135*  BUN 16 15  CREATININE 1.26* 0.96  CALCIUM 9.4 8.8*   PT/INR No results for input(s): LABPROT, INR in the last 72 hours. CMP     Component Value Date/Time   NA 139 01/17/2024 0657   K 3.3 (L) 01/17/2024 0657   CL 108 01/17/2024 0657   CO2 15 (L) 01/17/2024 0657   GLUCOSE 135 (H) 01/17/2024 0657   BUN 15 01/17/2024 0657   CREATININE 0.96 01/17/2024 0657   CALCIUM 8.8 (L) 01/17/2024 0657   PROT 6.1 (L) 01/17/2024 0657   ALBUMIN  3.1 (L) 01/17/2024 0657   AST 27 01/17/2024 0657   ALT 17 01/17/2024 0657   ALKPHOS 75 01/17/2024 0657   BILITOT 1.1 01/17/2024 0657   GFRNONAA 59 (L) 01/17/2024 0657   GFRAA  10/23/2007 1248    >60        The eGFR has been calculated using the MDRD equation. This calculation has not been validated in all clinical    Lipase     Component Value Date/Time   LIPASE 28 01/16/2024 0053       Studies/Results: DG Abd Portable 1V-Small Bowel Protocol-Position Verification Result Date: 01/16/2024 EXAM: 1 VIEW XRAY OF THE ABDOMEN 01/16/2024 12:38:00 PM COMPARISON: CT of the abdomen and pelvis dated 07/18/2013. CLINICAL HISTORY: Encounter for imaging study to confirm nasogastric (NG) tube placement. FINDINGS: BOWEL: Mild-to-moderate distention of loops of mid small bowel. Bowel anastomosis sutures are noted within the left upper quadrant. SOFT TISSUES: Surgical clips are noted in the gallbladder fossa near the gastroesophageal junction. BONES: No acute osseous abnormality. LINES AND TUBES: Since the previous study, a gastric tube has been placed, but it is looped in the distal esophagus and terminates above the level of view. IMPRESSION: 1. Gastric tube looped in the distal esophagus and terminates above the level of view. 2. Mild-to-moderate distention of loops of mid small bowel. Electronically signed by: evalene coho 01/16/2024 12:44 PM EDT RP Workstation: [id number]   CT Angio Chest PE W and/or Wo Contrast Result Date: 01/16/2024 EXAM: CTA of the Chest with contrast for PE 01/16/2024 08:55:15 AM TECHNIQUE: CTA of the chest was performed after the administration of intravenous contrast. Multiplanar reformatted images are provided for review.  MIP images are provided for review. Automated exposure control, iterative reconstruction, and/or weight based adjustment of the mA/kV was utilized to reduce the radiation dose to as low as reasonably achievable. COMPARISON: 04/24/2023 CLINICAL HISTORY: Pulmonary embolism (PE) suspected, low to intermediate prob, positive D-dimer; dysphagia, SOB. Omni 350 75cc; reports choking on oral medication this morning. Pt thinks there is pill stuck in esophagus. Pt able to talk. No resp distress. Pt has recurrent choking episode. FINDINGS: PULMONARY ARTERIES: Pulmonary arteries are  adequately opacified for evaluation. No pulmonary embolism. Main pulmonary artery is normal in caliber. MEDIASTINUM: The heart and pericardium demonstrate no acute abnormality. There is no acute abnormality of the thoracic aorta. LYMPH NODES: Subcarinal lymph node measures 1.7 cm, image 169/5, unchanged from previous exam. Right paratracheal lymph node measures 1.2 cm, image 115/5. Previously 1.4 cm. Multiple prominent upper abdominal lymph nodes are identified with a similar appearance to the previous exam of unknown clinical significance. Index left periaortic node measures 1.3 cm, image 128/3. Previously 1 cm. Gastrohepatic ligament node measures 1.1 cm image 113/3. Previously 1 cm. LUNGS AND PLEURA: High-grade focal luminal narrowing of the right middle lobe bronchus is again noted and appears unchanged from the previous exam. Associated chronic partial atelectasis of the right middle lobe with volume loss. Chronic subsegmental atelectasis and volume loss involving the posterior left upper lobe and Lingula. Calcified granulomas identified within the apical segment of the right upper lobe. No pleural fluid or airspace consolidation. Diffuse bronchial wall thickening. There are scattered lung nodules identified bilaterally which have a nonspecific appearance the largest nodule is in the right lung base measuring 1 x 0.6 cm, image 94/4. On the previous exam, this measured 1.1 x 0.8 cm. Posterior left upper lobe lung nodule is unchanged measuring 0.4 cm, image 51/4. UPPER ABDOMEN: Postoperative changes at the GE junction possibly from prior fundoplication wrap. Gaseous distention of the stomach is noted, which results in asymmetric elevation of the left hemidiaphragm. Postoperative change from prior distal pancreatectomy. SOFT TISSUES AND BONES: There are new fractures involving the anterolateral aspect of the left 8th and 9th ribs. Chronic right lateral rib fracture deformities are noted. IMPRESSION: 1. No evidence  of pulmonary embolism. 2. High-grade focal luminal narrowing of the right middle lobe bronchus with associated chronic partial atelectasis and volume loss, unchanged from previous exam. 3. Scattered bilateral lung nodules, largest in the right lung base measuring 1 x 0.6 cm, previously 1.1 x 0.8 cm. Posterior left upper lobe nodule is unchanged at 0.4 cm. 4. Multiple prominent upper abdominal lymph nodes, similar to previous exam, of unknown clinical significance. Electronically signed by: Waddell Calk MD 01/16/2024 09:27 AM EDT RP Workstation: [id number]   CT ABDOMEN PELVIS W CONTRAST Result Date: 01/16/2024 EXAM: CT ABDOMEN AND PELVIS WITH CONTRAST 01/16/2024 08:55:15 AM TECHNIQUE: CT of the abdomen and pelvis was performed with the administration of intravenous contrast (75mL iohexol  (OMNIPAQUE ) 350 MG/ML injection). Multiplanar reformatted images are provided for review. Automated exposure control, iterative reconstruction, and/or weight based adjustment of the mA/kV was utilized to reduce the radiation dose to as low as reasonably achievable. COMPARISON: CT of the abdomen and pelvis dated 10/17/2000. CLINICAL HISTORY: Abdominal pain, acute, nonlocalized. Reports choking on oral medication this morning. Patient thinks there is pill stuck in esophagus. Patient able to talk. No respiratory distress. Patient has recurrent choking episode. FINDINGS: LOWER CHEST: Bronchiectasis and streaky peribronchovascular consolidation present within the base of the right middle lobe, as before. LIVER: The liver is unremarkable. GALLBLADDER AND  BILE DUCTS: The patient is status post cholecystectomy. SPLEEN: The patient is status post splenectomy. PANCREAS: The patient is status post distal pancreatectomy. ADRENAL GLANDS: No acute abnormality. KIDNEYS, URETERS AND BLADDER: No stones in the kidneys or ureters. No hydronephrosis. No perinephric or periureteral stranding. Urinary bladder is unremarkable. GI AND BOWEL: The distal  sigmoid colon remains collapsed and featureless. The proximal sigmoid colon remains mildly dilated with air and formed fecal material. The patient is status post partial left colectomy. There are loops of moderately distended mid to distal small bowel, which measure up to approximately 3.2 cm in diameter. There is no abrupt transition point. Findings are compatible with ileus or partial small bowel obstruction. PERITONEUM AND RETROPERITONEUM: The patient is again noted to be status post abdominal hernia repair. VASCULATURE: Moderate calcific coronary artery disease. LYMPH NODES: No lymphadenopathy. REPRODUCTIVE ORGANS: The patient is status post hysterectomy. There are no abnormal adnexal masses. BONES AND SOFT TISSUES: No acute osseous abnormality. No focal soft tissue abnormality. IMPRESSION: 1. Moderately distended mid to distal small bowel loops, measuring up to 3.2 cm in diameter, without an abrupt transition point, compatible with ileus or partial small bowel obstruction. 2. Collapsed and featureless distal sigmoid colon with mildly dilated proximal sigmoid colon containing air and formed fecal material. Electronically signed by: evalene berrigan 01/16/2024 09:21 AM EDT RP Workstation: [id number]   DG Chest 1 View Result Date: 01/15/2024 CLINICAL DATA:  Choking episode earlier today. Patient choked on oral medications. Feels like pills stuck in the esophagus. EXAM: CHEST  1 VIEW COMPARISON:  Chest and ribs 05/01/2023.  CT chest 05/04/2023 FINDINGS: Shallow inspiration with elevation of the left hemidiaphragm. Linear opacities in the lung bases are similar to the prior study, likely representing scarring. There is a circumscribed nodule projecting over the right lower lung. This is also unchanged. No pleural effusion or pneumothorax. Mediastinal contours appear intact. Calcification of the aorta. Old rib fractures. No radiopaque foreign bodies are identified. IMPRESSION: Shallow inspiration. Chronic linear  and nodular opacities in the lung bases as described. No evidence of active pulmonary disease. Electronically Signed   By: Elsie Gravely M.D.   On: 01/15/2024 19:41    Anti-infectives: Anti-infectives (From admission, onward)    None        Assessment/Plan  SBO - NGT unable to be placed yesterday - pt having more bowel function and abdominal exam benign this AM - check KUB this AM - if dilated bowel still present may attempt PO gastrografin  protocol, if bowel gas pattern appears non-obstructive then likely start CLD and just follow clinically  - no indication for emergent surgical intervention at this time but we will continue to follow    FEN: NPO, IVF per primary  VTE: SQH ID: no current abx    LOS: 1 day   I reviewed nursing notes, hospitalist notes, last 24 h vitals and pain scores, last 48 h intake and output, last 24 h labs and trends, and last 24 h imaging results.  This care required moderate level of medical decision making.    Burnard JONELLE Louder, [facility] Surgery 01/17/2024, 8:45 AM Please see Amion for pager number during day hours 7:00am-4:30pm

## 2024-01-17 NOTE — Progress Notes (Signed)
 OT Cancellation Note  Patient Details Name: Nicole Montgomery MRN: [id number] DOB: [birth year]   Cancelled Treatment:    Reason Eval/Treat Not Completed: Medical issues which prohibited therapy (Per RN, pt currently with significnat nausea and not appropriate for participation in OT eval at this time. RN requests OT reattempt later. OT to reattempt to see pt at a later time as appropriate/available.)  Margarie Rockey HERO., OTR/L, MA Acute Rehab [phone]   Margarie FORBES Horns 01/17/2024, 11:48 AM

## 2024-01-17 NOTE — Evaluation (Signed)
 Physical Therapy Evaluation Patient Details Name: Nicole Montgomery MRN: [id number] DOB: 1940/12/20 Today's Date: 01/17/2024  History of Present Illness  KEAGHAN BOWENS is a 83 y.o. female presented to [facility] ED 01/15/24 with trouble swallowing. CTAP in ED indicates concern for ileus or partial small bowel obstruction. PMHx: pancreatic adeno s/p partial pancreatectomy (2002), fundoplication (2005), sarcoidosis, trigeminal neuralgia, IBS, basal cell skin cancers, T2DM, HTN, and HLD.   Clinical Impression  Pt admitted with above diagnosis. PTA, pt was modI for functional mobility using a RW and independent to modI for ADLs/IADLs. She lives with her husband in a one story house with 5 STE and railings. Pt currently with functional limitations due to the deficits listed below (see PT Problem List). She completed bed mobility with supervision, transfers using RW with supervision, and gait using RW with CGA-supervision. Pt ambulated ~154ft with a reciprocal gait pattern. She demonstrated unsteadiness, but no LOB. Pt will benefit from acute skilled PT to increase her independence and safety with mobility to allow discharge. Recommend HHPT to increase strength, improve balance, decrease fall risk, and optimize safety within the home environment.      If plan is discharge home, recommend the following: Help with stairs or ramp for entrance;Assist for transportation   Can travel by private vehicle        Equipment Recommendations None recommended by PT (Pt already has DME)  Recommendations for Other Services       Functional Status Assessment Patient has had a recent decline in their functional status and demonstrates the ability to make significant improvements in function in a reasonable and predictable amount of time.     Precautions / Restrictions Precautions Precautions: Fall Recall of Precautions/Restrictions: Intact Restrictions Weight Bearing Restrictions Per Provider Order: No      Mobility   Bed Mobility Overal bed mobility: Needs Assistance Bed Mobility: Supine to Sit     Supine to sit: Supervision, HOB elevated     General bed mobility comments: Pt sat up on L side of bed. She brought BLE off EOB, elevated trunk, and scooted fwd til feet supported. Supervision for safety.    Transfers Overall transfer level: Needs assistance Equipment used: Rolling walker (2 wheels) Transfers: Sit to/from Stand, Bed to chair/wheelchair/BSC Sit to Stand: Supervision   Step pivot transfers: Supervision       General transfer comment: Pt stood from lowest bed height. She demonstrated proper hand placement using RW. Powered up without physical assist. Transferred to recliner chair on left. Good eccentric control. Supervision for safety.    Ambulation/Gait Ambulation/Gait assistance: Contact guard assist, Supervision Gait Distance (Feet): 150 Feet Assistive device: Rolling walker (2 wheels) Gait Pattern/deviations: Step-through pattern, Decreased stride length, Drifts right/left Gait velocity: decreased Gait velocity interpretation: <1.8 ft/sec, indicate of risk for recurrent falls   General Gait Details: Pt ambulated with a reciprocal gait pattern, even weight shift, and good foot clearence. She maintained body inside RW at all times. Manuevering within room and hallway well. Pt slightly unsteady as she fatigued. No LOB.  Stairs            Wheelchair Mobility     Tilt Bed    Modified Rankin (Stroke Patients Only)       Balance Overall balance assessment: Needs assistance, History of Falls Sitting-balance support: Single extremity supported, No upper extremity supported, Feet supported Sitting balance-Leahy Scale: Fair Sitting balance - Comments: Pt sat EOB with supervision. She demonstrated a posterior lean during LE MMT but was able  to self-correct Postural control: Posterior lean Standing balance support: Bilateral upper extremity supported, During functional  activity, Reliant on assistive device for balance Standing balance-Leahy Scale: Poor Standing balance comment: Pt dependent on RW                             Pertinent Vitals/Pain Pain Assessment Pain Assessment: No/denies pain    Home Living Family/patient expects to be discharged to:: Private residence Living Arrangements: Spouse/significant other;Other relatives (two young great-grandsons are currently staying with pt and her husband for a few weeks before school starts) Available Help at Discharge: Family;Available 24 hours/day Type of Home: House Home Access: Stairs to enter Entrance Stairs-Rails: Right;Left;Can reach both Entrance Stairs-Number of Steps: 5   Home Layout: One level Home Equipment: Agricultural consultant (2 wheels);Cane - single point;Hand held shower head;Grab bars - tub/shower Additional Comments: Pt reports she typically takes baths instead of showers    Prior Function Prior Level of Function : Independent/Modified Independent;Driving;History of Falls (last six months)             Mobility Comments: Mod I with RW or SPC; pt reports 2 falls and several near falls in past year and reports dizziness if she looks up when walking ADLs Comments: Largely Ind to Mod I with ADLs and IADLs with intermittent CGA from husband to get in/out of tub. Pt drives. Pt enjoys spending time with her great-grandchildren and doing crafts, including Copy and crochet.     Extremity/Trunk Assessment   Upper Extremity Assessment Upper Extremity Assessment: Defer to OT evaluation RUE Deficits / Details: incision and stiches on dorsal side of hand near thumb due to recent removal of basal cell skin cancer with incision clean and dry and pt reporting no pain.    Lower Extremity Assessment Lower Extremity Assessment: Generalized weakness    Cervical / Trunk Assessment Cervical / Trunk Assessment: Normal  Communication   Communication Communication:  Impaired Factors Affecting Communication: Hearing impaired    Cognition Arousal: Alert Behavior During Therapy: WFL for tasks assessed/performed   PT - Cognitive impairments: No apparent impairments                       PT - Cognition Comments: Pt A,Ox4 Following commands: Intact       Cueing Cueing Techniques: Verbal cues, Visual cues     General Comments      Exercises     Assessment/Plan    PT Assessment Patient needs continued PT services  PT Problem List Decreased strength;Decreased activity tolerance;Decreased balance;Decreased mobility       PT Treatment Interventions DME instruction;Gait training;Stair training;Functional mobility training;Therapeutic activities;Therapeutic exercise;Balance training;Patient/family education    PT Goals (Current goals can be found in the Care Plan section)  Acute Rehab PT Goals Patient Stated Goal: Return Home PT Goal Formulation: With patient Time For Goal Achievement: 01/24/24 Potential to Achieve Goals: Good    Frequency Min 2X/week     Co-evaluation               AM-PAC PT 6 Clicks Mobility  Outcome Measure Help needed turning from your back to your side while in a flat bed without using bedrails?: A Little Help needed moving from lying on your back to sitting on the side of a flat bed without using bedrails?: A Little Help needed moving to and from a bed to a chair (including a wheelchair)?: A Little Help needed  standing up from a chair using your arms (e.g., wheelchair or bedside chair)?: A Little Help needed to walk in hospital room?: A Little Help needed climbing 3-5 steps with a railing? : A Lot 6 Click Score: 17    End of Session Equipment Utilized During Treatment: Gait belt Activity Tolerance: Patient tolerated treatment well Patient left: in chair;with call bell/phone within reach;with chair alarm set Nurse Communication: Mobility status PT Visit Diagnosis: Muscle weakness (generalized)  (M62.81);Unsteadiness on feet (R26.81);Other abnormalities of gait and mobility (R26.89)    Time: 8577-8552 PT Time Calculation (min) (ACUTE ONLY): 25 min   Charges:   PT Evaluation $PT Eval Moderate Complexity: 1 Mod   PT General Charges $$ ACUTE PT VISIT: 1 Visit         Randall SAUNDERS, PT, DPT Acute Rehabilitation Services Office: [phone] Secure Chat Preferred  Delon CHRISTELLA Callander 01/17/2024, 4:54 PM

## 2024-01-18 ENCOUNTER — Inpatient Hospital Stay (HOSPITAL_COMMUNITY)

## 2024-01-18 LAB — CBC WITH DIFFERENTIAL/PLATELET
Abs Immature Granulocytes: 0.04 K/uL (ref 0.00–0.07)
Basophils Absolute: 0.1 K/uL (ref 0.0–0.1)
Basophils Relative: 0 %
Eosinophils Absolute: 0 K/uL (ref 0.0–0.5)
Eosinophils Relative: 0 %
HCT: 44.2 % (ref 36.0–46.0)
Hemoglobin: 13.5 g/dL (ref 12.0–15.0)
Immature Granulocytes: 0 %
Lymphocytes Relative: 19 %
Lymphs Abs: 2.4 K/uL (ref 0.7–4.0)
MCH: 29.9 pg (ref 26.0–34.0)
MCHC: 30.5 g/dL (ref 30.0–36.0)
MCV: 97.8 fL (ref 80.0–100.0)
Monocytes Absolute: 0.8 K/uL (ref 0.1–1.0)
Monocytes Relative: 7 %
Neutro Abs: 8.9 K/uL — ABNORMAL HIGH (ref 1.7–7.7)
Neutrophils Relative %: 74 %
Platelets: 421 K/uL — ABNORMAL HIGH (ref 150–400)
RBC: 4.52 MIL/uL (ref 3.87–5.11)
RDW: 14.6 % (ref 11.5–15.5)
WBC: 12.2 K/uL — ABNORMAL HIGH (ref 4.0–10.5)
nRBC: 0 % (ref 0.0–0.2)

## 2024-01-18 LAB — GLUCOSE, CAPILLARY
Glucose-Capillary: 102 mg/dL — ABNORMAL HIGH (ref 70–99)
Glucose-Capillary: 111 mg/dL — ABNORMAL HIGH (ref 70–99)
Glucose-Capillary: 125 mg/dL — ABNORMAL HIGH (ref 70–99)
Glucose-Capillary: 134 mg/dL — ABNORMAL HIGH (ref 70–99)
Glucose-Capillary: 210 mg/dL — ABNORMAL HIGH (ref 70–99)
Glucose-Capillary: 222 mg/dL — ABNORMAL HIGH (ref 70–99)
Glucose-Capillary: 65 mg/dL — ABNORMAL LOW (ref 70–99)

## 2024-01-18 LAB — MAGNESIUM: Magnesium: 1.7 mg/dL (ref 1.7–2.4)

## 2024-01-18 LAB — BASIC METABOLIC PANEL WITH GFR
Anion gap: 16 — ABNORMAL HIGH (ref 5–15)
BUN: 14 mg/dL (ref 8–23)
CO2: 13 mmol/L — ABNORMAL LOW (ref 22–32)
Calcium: 8.8 mg/dL — ABNORMAL LOW (ref 8.9–10.3)
Chloride: 112 mmol/L — ABNORMAL HIGH (ref 98–111)
Creatinine, Ser: 0.9 mg/dL (ref 0.44–1.00)
GFR, Estimated: >60 mL/min (ref >60)
Glucose, Bld: 141 mg/dL — ABNORMAL HIGH (ref 70–99)
Potassium: 4.2 mmol/L (ref 3.5–5.1)
Sodium: 141 mmol/L (ref 135–145)

## 2024-01-18 LAB — HEMOGLOBIN A1C
Hgb A1c MFr Bld: 9.1 % — ABNORMAL HIGH (ref 4.8–5.6)
Mean Plasma Glucose: 214 mg/dL

## 2024-01-18 MED ORDER — PRAVASTATIN SODIUM 40 MG PO TABS
80.0000 mg | ORAL_TABLET | Freq: Every day | ORAL | Status: DC
  Administered 2024-01-18: 80 mg via ORAL
  Filled 2024-01-18: qty 2

## 2024-01-18 MED ORDER — GABAPENTIN 300 MG PO CAPS
300.0000 mg | ORAL_CAPSULE | Freq: Four times a day (QID) | ORAL | Status: DC
  Administered 2024-01-18 – 2024-01-19 (×5): 300 mg via ORAL
  Filled 2024-01-18 (×5): qty 1

## 2024-01-18 MED ORDER — POTASSIUM CHLORIDE 10 MEQ/100ML IV SOLN
10.0000 meq | INTRAVENOUS | Status: DC
  Administered 2024-01-18 (×2): 10 meq via INTRAVENOUS
  Filled 2024-01-18 (×2): qty 100

## 2024-01-18 MED ORDER — POTASSIUM CHLORIDE 10 MEQ/100ML IV SOLN
10.0000 meq | INTRAVENOUS | Status: DC
  Administered 2024-01-18: 10 meq via INTRAVENOUS
  Filled 2024-01-18: qty 100

## 2024-01-18 MED ORDER — DULOXETINE HCL 60 MG PO CPEP
60.0000 mg | ORAL_CAPSULE | Freq: Every day | ORAL | Status: DC
  Administered 2024-01-18 – 2024-01-19 (×2): 60 mg via ORAL
  Filled 2024-01-18 (×2): qty 1

## 2024-01-18 MED ORDER — PIOGLITAZONE HCL 30 MG PO TABS: 45.0000 mg | ORAL_TABLET | Freq: Every day | ORAL | Status: DC

## 2024-01-18 MED ORDER — AMITRIPTYLINE HCL 50 MG PO TABS
50.0000 mg | ORAL_TABLET | Freq: Every day | ORAL | Status: DC
  Administered 2024-01-18: 50 mg via ORAL
  Filled 2024-01-18: qty 1

## 2024-01-18 MED ORDER — MAGNESIUM SULFATE 2 GM/50ML IV SOLN
2.0000 g | Freq: Once | INTRAVENOUS | Status: AC
  Administered 2024-01-18: 2 g via INTRAVENOUS
  Filled 2024-01-18: qty 50

## 2024-01-18 NOTE — Plan of Care (Signed)
  Problem: Education: Goal: Ability to describe self-care measures that may prevent or decrease complications (Diabetes Survival Skills Education) will improve Outcome: Progressing   Problem: Skin Integrity: Goal: Risk for impaired skin integrity will decrease Outcome: Progressing   Problem: Activity: Goal: Risk for activity intolerance will decrease Outcome: Progressing   Problem: Elimination: Goal: Will not experience complications related to bowel motility Outcome: Progressing   Problem: Safety: Goal: Ability to remain free from injury will improve Outcome: Progressing   Problem: Skin Integrity: Goal: Risk for impaired skin integrity will decrease Outcome: Progressing

## 2024-01-18 NOTE — Progress Notes (Signed)
 HD#2 SUBJECTIVE:  Patient Summary: Nicole Montgomery is a 83 y.o. person living with a history of pancreatic CA s/p pancreatectomy and nissen fundoplication, T2DM, trigeminal neuralgia, sarcoidosis, and IBD who presented with choking and admitted for dysphagia and concern for SBO.  Overnight Events: could not tolerate the entire gastrografin  bottle  Interim History: Patient said the liquid gen surg wanted her to drink caused her throat to spasm again and was making her dry heave. She didn't throw up any of the liquid that she swallowed but was dry heaving and felt nauseous.  The tightness and nausea have gone away and she does not have it anymore. She is tired and hasn't gotten good sleep in several days. On exam today she denies trouble swallowing, abdominal pain, headache, and SOB. She had a watery BM this morning and is urinating without pain. Denies new back pain. She does have tingling in her hands from her chronic neuropathy and can't have the fan blowing on her face because it hurts her trigeminal neuralgia.  Discussed that we will talk with gen surg team about the results of the test, test her swallow function, and hopefully allow her to start taking her home medications again. She prefers a pureed diet as she can't chew.   OBJECTIVE:  Vital Signs: Vitals:   01/17/24 0437 01/17/24 0845 01/17/24 1601 01/17/24 1939  BP: (!) 154/84 (!) 141/73 (!) 155/80 (!) 147/79  Pulse: 81 79 91 81  Resp: 18 18 17 17   Temp: 98 F (36.7 C) 98 F (36.7 C) 98.1 F (36.7 C) 98 F (36.7 C)  TempSrc: Oral Oral Oral Oral  SpO2: 96% 98% 100% 99%   Supplemental O2: Room Air SpO2: 99 %  There were no vitals filed for this visit.   Intake/Output Summary (Last 24 hours) at 01/18/2024 0631 Last data filed at 01/17/2024 1747 Gross per 24 hour  Intake 435.05 ml  Output --  Net 435.05 ml   Net IO Since Admission: 1,677.52 mL [01/18/24 0631]  Physical Exam: Physical Exam Constitutional:      Appearance:  Normal appearance. She is not ill-appearing.  HENT:     Nose: No congestion or rhinorrhea.     Mouth/Throat:     Mouth: Mucous membranes are moist.     Pharynx: Oropharynx is clear.  Eyes:     Extraocular Movements: Extraocular movements intact.     Conjunctiva/sclera: Conjunctivae normal.  Cardiovascular:     Rate and Rhythm: Normal rate and regular rhythm.  Pulmonary:     Effort: Pulmonary effort is normal.     Breath sounds: Normal breath sounds.  Abdominal:     General: Bowel sounds are normal. There is no distension.     Palpations: Abdomen is soft.     Tenderness: There is no abdominal tenderness.  Musculoskeletal:     Right lower leg: No edema.     Left lower leg: No edema.  Skin:    General: Skin is warm.     Comments: Right hand s/p surgery, appropriate healing, no signs of infection  Neurological:     General: No focal deficit present.     Mental Status: She is alert and oriented to person, place, and time.     Patient Lines/Drains/Airways Status     Active Line/Drains/Airways     Name Placement date Placement time Site Days   Peripheral IV 01/16/24 22 G 1.75 Anterior;Left Forearm 01/16/24  0122  Forearm  2   Peripheral IV 01/17/24 24  G Left Antecubital 01/17/24  1126  Antecubital  1            Pertinent labs and imaging:  XR 8/3 AM still dilated bowel so attempted gastrografin     Latest Ref Rng & Units 01/17/2024    6:57 AM 01/15/2024    5:18 PM 05/06/2023    7:31 AM  CBC  WBC 4.0 - 10.5 K/uL 11.8  12.9  10.5   Hemoglobin 12.0 - 15.0 g/dL 86.7  85.5  88.2   Hematocrit 36.0 - 46.0 % 41.8  45.1  37.1   Platelets 150 - 400 K/uL 454  455  386        Latest Ref Rng & Units 01/17/2024    6:57 AM 01/16/2024   12:53 AM 01/15/2024    5:18 PM  CMP  Glucose 70 - 99 mg/dL 864   825   BUN 8 - 23 mg/dL 15   16   Creatinine 9.55 - 1.00 mg/dL 9.03   8.73   Sodium 864 - 145 mmol/L 139   128   Potassium 3.5 - 5.1 mmol/L 3.3   4.0   Chloride 98 - 111 mmol/L 108    97   CO2 22 - 32 mmol/L 15   18   Calcium 8.9 - 10.3 mg/dL 8.8   9.4   Total Protein 6.5 - 8.1 g/dL 6.1  6.8    Total Bilirubin 0.0 - 1.2 mg/dL 1.1  0.8    Alkaline Phos 38 - 126 U/L 75  85    AST 15 - 41 U/L 27  28    ALT 0 - 44 U/L 17  19      DG Abd Portable 1V Result Date: 01/17/2024 CLINICAL DATA:  Evaluate for small bowel obstruction. EXAM: PORTABLE ABDOMEN - 1 VIEW COMPARISON:  01/16/2024 FINDINGS: Air is present throughout the colon. There is continued evidence of several air-filled mildly dilated small bowel loops measuring up to 3.7 cm in diameter as there are less number of these dilated small bowel loops compared to the recent prior exam. No free peritoneal air. Surgical clips over the upper abdomen. Postsurgical change compatible with previous abdominal wall hernia repair. IMPRESSION: Continued evidence of several air-filled mildly dilated small bowel loops measuring up to 3.7 cm in diameter as there are less number of these dilated small bowel loops compared to the recent prior exam. Findings may be due to improving partial small bowel obstruction versus ileus. Electronically Signed   By: Toribio Agreste M.D.   On: 01/17/2024 09:55    ASSESSMENT/PLAN:  Assessment: Principal Problem:   Small bowel obstruction (HCC) Active Problems:   Essential hypertension   Gastroesophageal reflux disease without esophagitis   Diabetes mellitus secondary to pancreatic insufficiency (HCC)   Vomiting   Choking   Hyponatremia   Leukocytosis   Thrombocytosis   Status post skin and subcutaneous tissue surgery   Plan: Nicole Montgomery is a 83 y.o. person living with a history of pancreatic CA s/p pancreatectomy and nissen fundoplication, T2DM, sarcoidosis, and IBD who presented with choking and admitted for dysphagia and concern for SBO on hospital day 1.    #Abdominal distention Partial small bowel obstruction vs ileus vs impaction vs IDB CTAP in ED indicates concern for ileus or partial small  bowel obstruction. Denies abdominal pain or distention today. Patient had watery BM this morning, denies abdominal tenderness and distention.  -Appreciate gen surg recommendations. Repeat Xray demonstrated persistent dilated small bowel so gen  surg went ahead with PO gastrografin . Repeat Xray 8/4 showed contrast present in colon, reflecting focal ileus. - plan to trial a clear liquid diet - Ondansetron , hydromorphone , acetaminophen  prn - K 4.2 after repletion - Mg 1.7, ordered 2 g IV   #Choking Esophageal dysmotility vs fundoplication complication vs esophageal stricture  Patient had nissen fundoplication surgery a couple years after pancreatectomy in 2002. Has had it re-operated on in the past. CTA in ED neg for PE. Patient denies N/V today and states she can swallow without difficulty.  -PPI 40 mg    #T2DM Patient hospitalized for 17 days in January 2024 for DKA and PNA c/p respiratory failure with 8 days of intubation and AKI possibly due to Bactrim . Glucose 174 on admission, patient states compliant with home meds including lantus  and -glitazone.  -CBG monitoring  -A1c 9.1 -sliding scale insulin , glargine 5 units, aspart 0-9 units -Cont hold Pioglitazone  until resume normal diet, and hold GLP1    #Thrombocytosis Found to have thrombocytosis during DKA/PNA admission in 06/2022, thought to be reactive. Recent CBCs in 04/2023 plts wnl.  -CTM    #Hyponatremia 128 on admission, likely due to anorexia. Appears euvolemic on exam.  -NS continuous infusion -139 on 8/3, 141 on 8/4   #BCC s/p excision Recently had BCC removed by derm. Derm had to reopen and excise three times to fully remove BCC. Took course of abx and restarted on another higher dose of abx by derm. Several stitches, appropriate healing, mildly erythematous -Monitor for signs of infection, topical mupirocin   -Hold PO abx   #Trigeminal neuralgia -Cont Home meds amitriptyline  50 mg daily, duloxetine  60 mg daily -Usually  taking gabapentin  900 mg QID, will resume 300 mg QID to ramp up    #HLD -cont atorvastation  Best Practice: Diet: pending VTE: heparin  injection 5,000 Units Start: 01/16/24 1400 Code: Full  Disposition planning: Therapy Recs: Home Health,  Family Contact: Husband, called and notified. DISPO: Anticipated discharge tomorrow to Home pending diet.  Signature:  Viktoria Charmayne [facility]  6:31 AM, 01/18/2024  On Call pager [phone]

## 2024-01-18 NOTE — TOC Initial Note (Signed)
 Transition of Care (TOC) - Initial/Assessment Note   Spoke to patient at bedside.   Patient from home with spouse. Has all required DME.   PT recommending home health PT at discharge. Patient in agreement. Offered Medicare.gov list. No preference . Kelly with [facility] accepted .   NCM secure chatted attending team for orders and face to face  Patient Details  Name: Nicole Montgomery MRN: [id number] Date of Birth: 11-16-1940  Transition of Care [facility]) CM/SW Contact:    Stephane Powell Jansky, RN Phone Number: 01/18/2024, 9:57 AM  Clinical Narrative:                   Expected Discharge Plan: Home w Home Health Services Barriers to Discharge: Continued Medical Work up   Patient Goals and CMS Choice Patient states their goals for this hospitalization and ongoing recovery are:: to retrurn to home CMS Medicare.gov Compare Post Acute Care list provided to:: Patient Choice offered to / list presented to : Patient      Expected Discharge Plan and Services   Discharge Planning Services: CM Consult Post Acute Care Choice: Home Health Living arrangements for the past 2 months: Single Family Home                 DME Arranged: N/A DME Agency: NA       HH Arranged: PT HH Agency: [facility] Date HH Agency Contacted: 01/18/24 Time HH Agency Contacted: [phone] Representative spoke with at [facility] Agency: [place]  Prior Living Arrangements/Services Living arrangements for the past 2 months: Single Family Home Lives with:: Spouse Patient language and need for interpreter reviewed:: Yes Do you feel safe going back to the place where you live?: Yes      Need for Family Participation in Patient Care: Yes (Comment) Care giver support system in place?: Yes (comment) Current home services: DME Criminal Activity/Legal Involvement Pertinent to Current Situation/Hospitalization: No - Comment as needed  Activities of Daily Living      Permission Sought/Granted   Permission granted to  share information with : Yes, Verbal Permission Granted     Permission granted to share info w AGENCY: [facility]        Emotional Assessment Appearance:: Appears stated age Attitude/Demeanor/Rapport: Engaged Affect (typically observed): Appropriate Orientation: : Oriented to Self, Oriented to Place, Oriented to  Time, Oriented to Situation Alcohol  / Substance Use: Not Applicable Psych Involvement: No (comment)  Admission diagnosis:  Small bowel obstruction (HCC) [K56.609] Partial small bowel obstruction (HCC) [K56.600] Choking, initial encounter [T17.308A] Abdominal pain, unspecified abdominal location [R10.9] Vomiting, unspecified vomiting type, unspecified whether nausea present [R11.10] Patient Active Problem List   Diagnosis Date Noted   Small bowel obstruction (HCC) 01/16/2024   Diabetes mellitus secondary to pancreatic insufficiency (HCC) 01/16/2024   Vomiting 01/16/2024   Choking 01/16/2024   Hyponatremia 01/16/2024   Leukocytosis 01/16/2024   Thrombocytosis 01/16/2024   Status post skin and subcutaneous tissue surgery 01/16/2024   Trauma 05/04/2023   Pneumonia of right lung due to infectious organism 06/22/2022   Malnutrition of moderate degree 06/19/2022   On mechanically assisted ventilation (HCC) 06/18/2022   Pressure injury of skin 06/18/2022   DKA (diabetic ketoacidosis) (HCC) 06/17/2022   Influenza A 06/17/2022   Acute respiratory failure with hypoxia (HCC) 06/17/2022   Pain in left foot 01/28/2017   Plantar fasciitis, left 01/28/2017   Essential hypertension 04/12/2015   Gastroesophageal reflux disease without esophagitis 04/12/2015   Hyperlipidemia, unspecified 04/12/2015   Postherpetic trigeminal neuralgia 01/13/2013  PCP:  Trinidad Hun, MD Pharmacy:   [facility] - [place], [place] - [address] [place] [address] Phone: [phone] Fax: [phone]  [facility] [address], [place] - [address] [facility] [place] [address] Phone: [phone] Fax: [phone]     Social Drivers of Health (SDOH) Social History: SDOH Screenings   Food Insecurity: No Food Insecurity (01/16/2024)  Housing: Unknown (01/16/2024)  Transportation Needs: No Transportation Needs (01/16/2024)  Utilities: Not At Risk (01/16/2024)  Social Connections: Unknown (01/16/2024)  Tobacco Use: Low Risk  (01/15/2024)   SDOH Interventions:     Readmission Risk Interventions     No data to display

## 2024-01-18 NOTE — Plan of Care (Signed)
  Problem: Pain Managment: Goal: General experience of comfort will improve and/or be controlled Outcome: Progressing   Problem: Safety: Goal: Ability to remain free from injury will improve Outcome: Progressing   Problem: Skin Integrity: Goal: Risk for impaired skin integrity will decrease Outcome: Progressing

## 2024-01-18 NOTE — Inpatient Diabetes Management (Signed)
 Inpatient Diabetes Program Recommendations  AACE/ADA: New Consensus Statement on Inpatient Glycemic Control (2025)  Target Ranges:  Prepandial:   less than 140 mg/dL      Peak postprandial:   less than 180 mg/dL (1-2 hours)      Critically ill patients:  140 - 180 mg/dL   Lab Results  Component Value Date   GLUCAP 102 (H) 01/18/2024   HGBA1C 9.1 (H) 01/16/2024    Review of Glycemic Control  Latest Reference Range & Units 01/18/24 06:16 01/18/24 07:38 01/18/24 12:11  Glucose-Capillary 70 - 99 mg/dL 865 (H) 874 (H) 897 (H)   Diabetes history: DM 2 Outpatient Diabetes medications:  Lantus  5 units q HS Actos  45 mg daily Ozempic 0.5 mg weekly Current orders for Inpatient glycemic control:  Novolog  0-9 units q 4 hours Semglee  5 units daily  Inpatient Diabetes Program Recommendations:   CBG's currently controlled.  **Note that patient takes GLP-1 (Ozempic)- May not be appropriate to restart at discharge due to GI side effects.   Thanks,  Randall Bullocks, RN, BC-ADM Inpatient Diabetes Coordinator Pager [phone]  (8a-5p)

## 2024-01-18 NOTE — Progress Notes (Signed)
 Subjective: CC: She reports some nausea with po contrast yesterday.  Currently no abdominal pain, distension, n/v. She feels her abdomen is back to baseline. No PRN pain or anti-nausea medication this morning. She is passing flatus and had a liquid bm this am. Xray with contrast in colon.   HDS without fever, tachycardia or hypotension. WBC 12.2 (11.8)  Objective: Vital signs in last 24 hours: Temp:  [97.6 F (36.4 C)-98.1 F (36.7 C)] 97.6 F (36.4 C) (08/04 0741) Pulse Rate:  [77-91] 77 (08/04 0741) Resp:  [17] 17 (08/04 0741) BP: (138-155)/(75-80) 138/75 (08/04 0741) SpO2:  [99 %-100 %] 100 % (08/04 0741) Last BM Date : 01/17/24  Intake/Output from previous day: 08/03 0701 - 08/04 0700 In: 435.1 [IV Piggyback:435.1] Out: -  Intake/Output this shift: No intake/output data recorded.  PE: Gen:  Alert, NAD, pleasant Abd: Soft, ND, NT, +BS  Lab Results:  Recent Labs    01/17/24 0657 01/18/24 0745  WBC 11.8* 12.2*  HGB 13.2 13.5  HCT 41.8 44.2  PLT 454* 421*   BMET Recent Labs    01/17/24 0657 01/18/24 0745  NA 139 141  K 3.3* 4.2  CL 108 112*  CO2 15* 13*  GLUCOSE 135* 141*  BUN 15 14  CREATININE 0.96 0.90  CALCIUM 8.8* 8.8*   PT/INR No results for input(s): LABPROT, INR in the last 72 hours. CMP     Component Value Date/Time   NA 141 01/18/2024 0745   K 4.2 01/18/2024 0745   CL 112 (H) 01/18/2024 0745   CO2 13 (L) 01/18/2024 0745   GLUCOSE 141 (H) 01/18/2024 0745   BUN 14 01/18/2024 0745   CREATININE 0.90 01/18/2024 0745   CALCIUM 8.8 (L) 01/18/2024 0745   PROT 6.1 (L) 01/17/2024 0657   ALBUMIN  3.1 (L) 01/17/2024 0657   AST 27 01/17/2024 0657   ALT 17 01/17/2024 0657   ALKPHOS 75 01/17/2024 0657   BILITOT 1.1 01/17/2024 0657   GFRNONAA >60 01/18/2024 0745   GFRAA  10/23/2007 1248    >60        The eGFR has been calculated using the MDRD equation. This calculation has not been validated in all clinical   Lipase      Component Value Date/Time   LIPASE 28 01/16/2024 0053    Studies/Results: DG Abd Portable 1V Result Date: 01/18/2024 CLINICAL DATA:  [id number] SBO (small bowel obstruction) (HCC) 881154 EXAM: DG ABD PORTABLE 1V COMPARISON:  None Available. FINDINGS: A single gas-filled segments of small bowel is mildly dilated in the mid to upper abdomen measuring 3.4 cm. Enteric contrast throughout the colon. No pneumoperitoneum. No organomegaly or radiopaque calculi. No acute fracture or destructive lesion. The lung bases are clear.Multilevel degenerative disc disease of the spine. Herniorrhaphy markers. IMPRESSION: Similar appearance of a single, mildly dilated gas-filled segment of small bowel in the upper abdomen measuring 3.4 cm. Enteric contrast present within the colon, suggesting that this reflect changes of a focal ileus. Electronically Signed   By: Rogelia Myers M.D.   On: 01/18/2024 08:27   DG Abd Portable 1V Result Date: 01/17/2024 CLINICAL DATA:  Evaluate for small bowel obstruction. EXAM: PORTABLE ABDOMEN - 1 VIEW COMPARISON:  01/16/2024 FINDINGS: Air is present throughout the colon. There is continued evidence of several air-filled mildly dilated small bowel loops measuring up to 3.7 cm in diameter as there are less number of these dilated small bowel loops compared to the recent prior exam. No free  peritoneal air. Surgical clips over the upper abdomen. Postsurgical change compatible with previous abdominal wall hernia repair. IMPRESSION: Continued evidence of several air-filled mildly dilated small bowel loops measuring up to 3.7 cm in diameter as there are less number of these dilated small bowel loops compared to the recent prior exam. Findings may be due to improving partial small bowel obstruction versus ileus. Electronically Signed   By: Toribio Agreste M.D.   On: 01/17/2024 09:55   DG Abd Portable 1V-Small Bowel Protocol-Position Verification Result Date: 01/16/2024 EXAM: 1 VIEW XRAY OF THE ABDOMEN  01/16/2024 12:38:00 PM COMPARISON: CT of the abdomen and pelvis dated 07/18/2013. CLINICAL HISTORY: Encounter for imaging study to confirm nasogastric (NG) tube placement. FINDINGS: BOWEL: Mild-to-moderate distention of loops of mid small bowel. Bowel anastomosis sutures are noted within the left upper quadrant. SOFT TISSUES: Surgical clips are noted in the gallbladder fossa near the gastroesophageal junction. BONES: No acute osseous abnormality. LINES AND TUBES: Since the previous study, a gastric tube has been placed, but it is looped in the distal esophagus and terminates above the level of view. IMPRESSION: 1. Gastric tube looped in the distal esophagus and terminates above the level of view. 2. Mild-to-moderate distention of loops of mid small bowel. Electronically signed by: evalene coho 01/16/2024 12:44 PM EDT RP Workstation: [id number]    Anti-infectives: Anti-infectives (From admission, onward)    None        Assessment/Plan SBO - CT w/ moderately distended mid to distal small bowel loops, measuring up to 3.2 cm in diameter, without an abrupt transition point - Hx of pancreatic cancer status post distal pancreatectomy/splenectomy/partial colectomy in 2012 as well as prior incisional hernia repair, appendectomy, hysterectomy, some kind of esophageal wrap and open cholecystectomy  - No current indication for emergency surgery - Clinically and radiographically improving. She has contrast in her colon on xray and reports resolution of symptoms, return of bowel function and is ND/NT on exam. Will trial CLD.   FEN - CLD, IVF per primary  VTE - SCDs, sqh ID - None   I reviewed  nursing notes, hospitalist notes, last 24 h vitals and pain scores, last 48 h intake and output, last 24 h labs and trends, and last 24 h imaging results.    LOS: 2 days    Ozell CHRISTELLA Shaper, [facility] Surgery 01/18/2024, 9:48 AM Please see Amion for pager number during day hours  7:00am-4:30pm

## 2024-01-18 NOTE — Progress Notes (Signed)
 Mobility Specialist Progress Note:    01/18/24 1045  Mobility  Activity Ambulated with assistance (In hallway/ to BR)  Level of Assistance Contact guard assist, steadying assist  Assistive Device Front wheel walker  Distance Ambulated (ft) 215 ft  Activity Response Tolerated well  Mobility Referral Yes  Mobility visit 1 Mobility  Mobility Specialist Start Time (ACUTE ONLY) 0944  Mobility Specialist Stop Time (ACUTE ONLY) 1001  Mobility Specialist Time Calculation (min) (ACUTE ONLY) 17 min   Received pt in bed and agreeable to mobility. No physical assistance needed. Pt requesting to use the BR prior to ambulation in halls. No c/o. Returned to room and left pt in chair with alarm on. Personal belongings and call light within reach. All needs met.  Lavanda Pollack Mobility Specialist  Please contact via [employer] or  Rehab Office [phone]

## 2024-01-18 NOTE — Evaluation (Signed)
 Clinical/Bedside Swallow Evaluation Patient Details  Name: Nicole Montgomery MRN: [id number] Date of Birth: 1940-12-27  Today's Date: 01/18/2024 Time: SLP Start Time (ACUTE ONLY): 1336 SLP Stop Time (ACUTE ONLY): 1359 SLP Time Calculation (min) (ACUTE ONLY): 23 min  Past Medical History:  Past Medical History:  Diagnosis Date   DM (diabetes mellitus) (HCC)    s/p pancreatectomy   H/O splenectomy 2012   HTN (hypertension)    Nephrolithiasis    Pancreatic cancer (HCC) 2012   Sarcoid 2012   Past Surgical History:  Past Surgical History:  Procedure Laterality Date   ABDOMINAL HYSTERECTOMY     APPENDECTOMY     CHOLECYSTECTOMY     PANCREATECTOMY  2012   PARTIAL COLECTOMY  2012   SPLENECTOMY  2012   HPI:  Nicole Montgomery is a 83 y.o. female presented to [facility] ED 01/15/24 with trouble swallowing described as feeling like there was a pill stuck in her throat. CTAP in ED indicates concern for ileus or partial small bowel obstruction. Previous MBS in 06/2022 during admission for flu/DKA with intubation recommended Dys 1 diet and honey thick liquids. She advanced to Dys 3 prior to discharge and f/u MBS 07/2022 showed further improvements including no aspiration, and a regular diet with thin liquids was recommended. PMHx: pancreatic adeno (s/p partial pancreatectomy as well as prior incisional hernia repair, appendectomy, hysterectomy, some kind of esophageal wrap and open cholecystectomy 2002), fundoplication (2005), sarcoidosis, trigeminal neuralgia, IBS, basal cell skin cancers, T2DM, HTN, and HLD.    Assessment / Plan / Recommendation  Clinical Impression  Pt reports a longstanding h/o esophageal symptoms, to which she attributes her pill dysphagia. She says that these symptoms have been present since her esophageal wrap, and that pills or solids will occasionally feel stuck if she goes too quickly or if she does not chew the food enough. Pt was given ice chips and water  only as she is currently on  clear liquid diet. She uses precautions with Mod I and there are no overt signs of oropharyngeal dysphagia or aspiration. As she is medically ready to advance solids, there is likely more concern for esophageal dysphagia. Pt acknowledges the need to progress slowly, and wants to start with purees when she is medically cleared to try food. SLP will f/u at least briefly to see how she does with solids but anticipate that she will do well from an oropharyngeal standpoint.   SLP Visit Diagnosis: Dysphagia, unspecified (R13.10)    Aspiration Risk       Diet Recommendation Thin liquid (solids to be determined once medically cleared by MD, although note that pt prefers to advance to purees first)    Liquid Administration via: Cup;Straw Medication Administration: Crushed with puree Supervision: Patient able to self feed;Intermittent supervision to cue for compensatory strategies Compensations: Slow rate;Small sips/bites Postural Changes: Seated upright at 90 degrees;Remain upright for at least 30 minutes after po intake    Other  Recommendations Oral Care Recommendations: Oral care BID     Assistance Recommended at Discharge    Functional Status Assessment Patient has had a recent decline in their functional status and demonstrates the ability to make significant improvements in function in a reasonable and predictable amount of time.  Frequency and Duration min 2x/week  2 weeks       Prognosis Prognosis for improved oropharyngeal function: Good      Swallow Study   General HPI: Nicole Montgomery is a 83 y.o. female presented to [facility] ED  01/15/24 with trouble swallowing described as feeling like there was a pill stuck in her throat. CTAP in ED indicates concern for ileus or partial small bowel obstruction. Previous MBS in 06/2022 during admission for flu/DKA with intubation recommended Dys 1 diet and honey thick liquids. She advanced to Dys 3 prior to discharge and f/u MBS 07/2022 showed further  improvements including no aspiration, and a regular diet with thin liquids was recommended. PMHx: pancreatic adeno (s/p partial pancreatectomy as well as prior incisional hernia repair, appendectomy, hysterectomy, some kind of esophageal wrap and open cholecystectomy 2002), fundoplication (2005), sarcoidosis, trigeminal neuralgia, IBS, basal cell skin cancers, T2DM, HTN, and HLD. Type of Study: Bedside Swallow Evaluation Previous Swallow Assessment: see HPI Diet Prior to this Study: Clear liquid diet;Thin liquids (Level 0) Temperature Spikes Noted: No Respiratory Status: Room air History of Recent Intubation: No Behavior/Cognition: Alert;Cooperative;Pleasant mood Oral Cavity Assessment: Within Functional Limits Oral Care Completed by SLP: No Oral Cavity - Dentition: Missing dentition (partial uppers not in place) Vision: Functional for self-feeding Self-Feeding Abilities: Able to feed self Patient Positioning: Upright in bed Baseline Vocal Quality: Normal Volitional Cough: Strong Volitional Swallow: Able to elicit    Oral/Motor/Sensory Function Overall Oral Motor/Sensory Function: Within functional limits   Ice Chips Ice chips: Within functional limits Presentation: Spoon   Thin Liquid Thin Liquid: Within functional limits Presentation: Cup;Self Fed;Straw    Nectar Thick Nectar Thick Liquid: Not tested   Honey Thick Honey Thick Liquid: Not tested   Puree Puree: Not tested   Solid     Solid: Not tested      Leita SAILOR., M.A. CCC-SLP Acute Rehabilitation Services Office: [phone]  Secure chat preferred  01/18/2024,2:16 PM

## 2024-01-19 ENCOUNTER — Other Ambulatory Visit (HOSPITAL_COMMUNITY): Payer: Self-pay

## 2024-01-19 ENCOUNTER — Telehealth (HOSPITAL_COMMUNITY): Payer: Self-pay | Admitting: Pharmacy Technician

## 2024-01-19 LAB — CBC
HCT: 44.1 % (ref 36.0–46.0)
Hemoglobin: 13.8 g/dL (ref 12.0–15.0)
MCH: 30.1 pg (ref 26.0–34.0)
MCHC: 31.3 g/dL (ref 30.0–36.0)
MCV: 96.1 fL (ref 80.0–100.0)
Platelets: 352 K/uL (ref 150–400)
RBC: 4.59 MIL/uL (ref 3.87–5.11)
RDW: 14.6 % (ref 11.5–15.5)
WBC: 11.2 K/uL — ABNORMAL HIGH (ref 4.0–10.5)
nRBC: 0 % (ref 0.0–0.2)

## 2024-01-19 LAB — BASIC METABOLIC PANEL WITH GFR
Anion gap: 10 (ref 5–15)
BUN: 11 mg/dL (ref 8–23)
CO2: 17 mmol/L — ABNORMAL LOW (ref 22–32)
Calcium: 8.6 mg/dL — ABNORMAL LOW (ref 8.9–10.3)
Chloride: 107 mmol/L (ref 98–111)
Creatinine, Ser: 0.79 mg/dL (ref 0.44–1.00)
GFR, Estimated: >60 mL/min (ref >60)
Glucose, Bld: 214 mg/dL — ABNORMAL HIGH (ref 70–99)
Potassium: 3.6 mmol/L (ref 3.5–5.1)
Sodium: 134 mmol/L — ABNORMAL LOW (ref 135–145)

## 2024-01-19 LAB — GLUCOSE, CAPILLARY
Glucose-Capillary: 100 mg/dL — ABNORMAL HIGH (ref 70–99)
Glucose-Capillary: 105 mg/dL — ABNORMAL HIGH (ref 70–99)
Glucose-Capillary: 119 mg/dL — ABNORMAL HIGH (ref 70–99)
Glucose-Capillary: 140 mg/dL — ABNORMAL HIGH (ref 70–99)

## 2024-01-19 MED ORDER — EMPAGLIFLOZIN 10 MG PO TABS
10.0000 mg | ORAL_TABLET | Freq: Every day | ORAL | 0 refills | Status: AC
  Filled 2024-01-19: qty 30, 30d supply, fill #0

## 2024-01-19 MED ORDER — INSULIN ASPART 100 UNIT/ML IJ SOLN
0.0000 [IU] | Freq: Three times a day (TID) | INTRAMUSCULAR | Status: DC
  Administered 2024-01-19: 1 [IU] via SUBCUTANEOUS

## 2024-01-19 MED ORDER — GABAPENTIN 300 MG PO CAPS
ORAL_CAPSULE | ORAL | 0 refills | Status: AC
  Filled 2024-01-19: qty 24, 5d supply, fill #0

## 2024-01-19 MED ORDER — PANTOPRAZOLE SODIUM 40 MG PO TBEC
40.0000 mg | DELAYED_RELEASE_TABLET | Freq: Every day | ORAL | Status: DC
  Administered 2024-01-19: 40 mg via ORAL
  Filled 2024-01-19: qty 1

## 2024-01-19 MED ORDER — MUPIROCIN 2 % EX OINT
TOPICAL_OINTMENT | Freq: Two times a day (BID) | CUTANEOUS | 0 refills | Status: AC
  Filled 2024-01-19: qty 22, 20d supply, fill #0

## 2024-01-19 NOTE — Progress Notes (Signed)
 AVS and discharge plan reviewed with patient and spouse.  Both expressed verbal agreement and understanding regarding All medications and discharge plan of care.  Clarification given by medicine team regarding home medication and patient was in [facility] [place] [facility] [facility] with plan.   Two PIV's  removed, dressings intact. TOC meds given to patient.  Transport to [employer].

## 2024-01-19 NOTE — Discharge Summary (Signed)
 Name: Nicole Montgomery MRN: [id number] DOB: June 27, 1940 83 y.o. PCP: Trinidad Hun, MD  Date of Admission: 01/15/2024  3:11 PM Date of Discharge: 01/19/2024 Attending Physician: Dr. Reyes Fenton  Discharge Diagnosis: 1. Principal Problem:   Small bowel obstruction (HCC) Active Problems:   Essential hypertension   Gastroesophageal reflux disease without esophagitis   Diabetes mellitus secondary to pancreatic insufficiency (HCC)   Vomiting   Choking   Hyponatremia   Leukocytosis   Thrombocytosis   Status post skin and subcutaneous tissue surgery   Discharge Medications: Allergies as of 01/19/2024       Reactions   Codeine Other (See Comments)   Light headedness Diaphoresis Near syncope   Lioresal [baclofen] Swelling, Other (See Comments)   Edema in hands/feet Tingling/neuropathy in hands/feet   Neurontin  [gabapentin ] Other (See Comments)   Confusion Memory loss Reaction only to extremely high doses; patient reports no issues at current dose - 900mg  QID   Percocet [oxycodone -acetaminophen ] Nausea And Vomiting   GI intolerance Severe stomach pain   Tegretol [carbamazepine] Other (See Comments)   Liver dysfunction at high doses   Lyrica [pregabalin] Other (See Comments)   Weight gain secondary to fluid retention Difficulty with concentration   Topamax [topiramate] Other (See Comments)   Confusion Mental status changes        Medication List     STOP taking these medications    aspirin  EC 81 MG tablet   levofloxacin 750 MG tablet Commonly known as: LEVAQUIN   Ozempic (0.25 or 0.5 MG/DOSE) 2 MG/1.5ML Sopn Generic drug: Semaglutide(0.25 or 0.5MG /DOS)   pioglitazone  45 MG tablet Commonly known as: ACTOS        TAKE these medications    acetaminophen  500 MG tablet Commonly known as: TYLENOL  Take 2 tablets (1,000 mg total) by mouth every 6 (six) hours as needed.   amitriptyline  50 MG tablet Commonly known as: ELAVIL  Take 1 tablet (50 mg total) by mouth  at bedtime.   DULoxetine  60 MG capsule Commonly known as: CYMBALTA  Take 60 mg by mouth daily.   gabapentin  300 MG capsule Commonly known as: NEURONTIN  Please take one capsule (300 mg) 4 times per day for the first three days after leaving the hospital. After three days, take two capsules (600 mg total) 4 times a day. What changed:  how much to take how to take this when to take this additional instructions Another medication with the same name was removed. Continue taking this medication, and follow the directions you see here.   insulin  glargine 100 UNIT/ML injection Commonly known as: LANTUS  Inject 5 Units into the skin at bedtime.   Jardiance  10 MG Tabs tablet Generic drug: empagliflozin  Take 1 tablet (10 mg total) by mouth daily before breakfast.   MELATONIN PO Take 5 each by mouth at bedtime. OTC melatonin gummies   Multivitamin Women 50+ Tabs Take 1 tablet by mouth daily.   mupirocin  ointment 2 % Commonly known as: BACTROBAN  Apply topically 2 (two) times daily.   pravastatin  80 MG tablet Commonly known as: PRAVACHOL  Take 80 mg by mouth daily.        Disposition and follow-up:   Nicole Montgomery was discharged from [facility] in Good condition.  At the hospital follow up visit please address:  1.   Partial SBO: Ensure continued resolution of symptoms, toleration of diet, and normal Bms.  Diabetes: Ensure she is tolerating her empaglifozin, otherwise, please ensure patient is on appropriate diabetes medication regimen. A1c 9 in  hospital. Neuropathy: Patient only given 4.5 days worth of gabapentin  on discharge. Please ensure she gets a refill, as appropriate.  PT recommended home health for patient.   2.  Labs / imaging needed at time of follow-up: none  3.  Pending labs/ test needing follow-up: none  Follow-up Appointments:  Follow-up Information     [facility] up.   Specialty: [facility] Contact  information: [address] [place] [address] [place] [address] [phone]         Trinidad Hun, MD. Call today.   Specialty: Family Medicine Why: To make a hospital follow up appointment. Contact information: [address] [place] [address] [phone]                  Hospital Course by problem list: Nicole Montgomery is a 83 y.o. person living with a history of pancreatic cancer s/p pancreatectomy, nissen fundoplication, sarcoidosis, T2DM, IBS, and trigeminal neuralgia who presented with choking and trouble swallowing and admitted for partial small bowel obstruction  now being discharged on hospital day 3 with the following pertinent hospital course:  #Abdominal distention Partial small bowel obstruction vs ileus vs impaction vs IDB CTAP in ED indicates concern for ileus or partial small bowel obstruction. Denies abdominal pain or distention the two days prior to discharge. Patient has had BMs for the two days prior to discharge, denies abdominal tenderness and distention. Repeat Xray demonstrated persistent dilated small bowel so gen surg went ahead with PO gastrografin . Post grastrografin Xray on 8/4 showed contrast present in colon. Patient cleared her swallow study, transitioned to clear liquid diet with progression to soft diet. Tolerated soft diet well and cleared for discharge by gen surg.  Ondansetron , hydromorphone , acetaminophen  prn were given in the hospital. Potassium and magnesium  were given while in hospital. Discharging with last K at 3.6.    #Choking Esophageal dysmotility vs fundoplication complication vs esophageal stricture  Patient had nissen fundoplication surgery a couple years after pancreatectomy in 2002. Has had it re-operated on in the past. CTA in ED neg for PE. Patient denies N/V the two days prior to discharge. She did have trouble tolerating the gastrografin . States she can swallow without difficulty on day of discharge. Given PPI 40  mg while in hospital.    #T2DM Patient hospitalized for 17 days in January 2024 for DKA and PNA c/p respiratory failure with 8 days of intubation and AKI possibly due to Bactrim . Glucose 174 on admission, patient states compliant with home meds including lantus  and -glitazone. CBGs on day of discharge were 105, 100, 119, 140. A1c 9.1. Sliding scale insulin , glargine 5 units, aspart 0-9 units while in hospital. Held Pioglitazone  and GLP1 in hospital and did not start them on discharge as patient says -glitazone makes her swell. GLP1 not recommended due to adverse GI effects. Started empaglifozan 10 mg daily on discharge. Recommend close hospital follow up with PCP to ensure appropriate diabetes regimen.    #Thrombocytosis Found to have thrombocytosis during DKA/PNA admission in 06/2022, thought to be reactive. Recent CBCs in 04/2023 patient had normal platelets. Were in 400s for first three days of admission, normal at 352 on day of discharge. Likely reactive.    #Hyponatremia 128 on admission, likely due to anorexia. Appears euvolemic on exam. Was on NS continuous infusion while in hospital. Will likely normalize with diet.    #BCC s/p excision Recently had BCC removed by derm. Derm had to reopen and excise  three times to fully remove BCC. Took course of abx and restarted on another higher dose of abx by derm. Several stitches, appropriate healing, mildly erythematous. Did not take abx given by derm while in the hospital, did not discharge patient with abx. No signs of infection while in hospital. Discharged patient with topical mupirocin .       Stable chronic medical conditions: Trigeminal neuralgia Cont Home meds amitriptyline  50 mg daily, duloxetine  60 mg daily and gabapentin  (patient taking 900 mg QID prior to admission) HLD cont atorvastation   Subjective Patient is feeling much better today and tolerated the clear diet well. She is ready to try soft foods and looking forward to trying  salmon at lunch. She has not had anymore abdominal pain or distention. She had a bowel movement right before our encounter and had a walk with PT before we came. She denies trouble swallowing and the twisting in her throat. Denies chest pain. Last night was the first good sleep she has gotten. Her nerve pain is much better since starting her home medications again. Discussed that if she can tolerate soft food diet that she can go home.  Discussed stopping her GLP1 because it can cause stomach upset. Patient has tried metformin in the past and did not tolerate. Will start SGLT2 for diabetes, cautioned patient on risk of UTI and to be aware of symptoms. Patient understands.  Discharge Exam:   BP (!) 143/83 (BP Location: Right Arm)   Pulse 70   Temp 97.7 F (36.5 C)   Resp 17   Ht 5' (1.524 m)   SpO2 96%   BMI 23.59 kg/m  Discharge exam:  Physical Exam Constitutional:      General: She is not in acute distress.    Appearance: She is not toxic-appearing.  HENT:     Nose: No congestion or rhinorrhea.     Mouth/Throat:     Pharynx: No oropharyngeal exudate.  Eyes:     Conjunctiva/sclera: Conjunctivae normal.  Cardiovascular:     Rate and Rhythm: Normal rate and regular rhythm.     Heart sounds: Normal heart sounds.  Pulmonary:     Effort: Pulmonary effort is normal.     Breath sounds: Normal breath sounds.  Abdominal:     General: There is no distension.     Palpations: There is no mass.     Tenderness: There is no abdominal tenderness.  Musculoskeletal:     Right lower leg: No edema.     Left lower leg: No edema.  Skin:    General: Skin is warm and dry.     Comments: -appropriately healing right hand surgical wound  Neurological:     General: No focal deficit present.     Mental Status: She is alert and oriented to person, place, and time.    Pertinent Labs, Studies, and Procedures:     Latest Ref Rng & Units 01/19/2024    1:55 PM 01/18/2024    7:45 AM 01/17/2024    6:57 AM   CBC  WBC 4.0 - 10.5 K/uL 11.2  12.2  11.8   Hemoglobin 12.0 - 15.0 g/dL 86.1  86.4  86.7   Hematocrit 36.0 - 46.0 % 44.1  44.2  41.8   Platelets 150 - 400 K/uL 352  421  454        Latest Ref Rng & Units 01/19/2024    1:55 PM 01/18/2024    7:45 AM 01/17/2024    6:57 AM  CMP  Glucose 70 - 99 mg/dL 785  858  864   BUN 8 - 23 mg/dL 11  14  15    Creatinine 0.44 - 1.00 mg/dL 9.20  9.09  9.03   Sodium 135 - 145 mmol/L 134  141  139   Potassium 3.5 - 5.1 mmol/L 3.6  4.2  3.3   Chloride 98 - 111 mmol/L 107  112  108   CO2 22 - 32 mmol/L 17  13  15    Calcium 8.9 - 10.3 mg/dL 8.6  8.8  8.8   Total Protein 6.5 - 8.1 g/dL   6.1   Total Bilirubin 0.0 - 1.2 mg/dL   1.1   Alkaline Phos 38 - 126 U/L   75   AST 15 - 41 U/L   27   ALT 0 - 44 U/L   17     DG Abd Portable 1V-Small Bowel Protocol-Position Verification Result Date: 01/16/2024 EXAM: 1 VIEW XRAY OF THE ABDOMEN 01/16/2024 12:38:00 PM COMPARISON: CT of the abdomen and pelvis dated 07/18/2013. CLINICAL HISTORY: Encounter for imaging study to confirm nasogastric (NG) tube placement. FINDINGS: BOWEL: Mild-to-moderate distention of loops of mid small bowel. Bowel anastomosis sutures are noted within the left upper quadrant. SOFT TISSUES: Surgical clips are noted in the gallbladder fossa near the gastroesophageal junction. BONES: No acute osseous abnormality. LINES AND TUBES: Since the previous study, a gastric tube has been placed, but it is looped in the distal esophagus and terminates above the level of view. IMPRESSION: 1. Gastric tube looped in the distal esophagus and terminates above the level of view. 2. Mild-to-moderate distention of loops of mid small bowel. Electronically signed by: evalene coho 01/16/2024 12:44 PM EDT RP Workstation: [id number]   CT Angio Chest PE W and/or Wo Contrast Result Date: 01/16/2024 EXAM: CTA of the Chest with contrast for PE 01/16/2024 08:55:15 AM TECHNIQUE: CTA of the chest was performed after the  administration of intravenous contrast. Multiplanar reformatted images are provided for review. MIP images are provided for review. Automated exposure control, iterative reconstruction, and/or weight based adjustment of the mA/kV was utilized to reduce the radiation dose to as low as reasonably achievable. COMPARISON: 04/24/2023 CLINICAL HISTORY: Pulmonary embolism (PE) suspected, low to intermediate prob, positive D-dimer; dysphagia, SOB. Omni 350 75cc; reports choking on oral medication this morning. Pt thinks there is pill stuck in esophagus. Pt able to talk. No resp distress. Pt has recurrent choking episode. FINDINGS: PULMONARY ARTERIES: Pulmonary arteries are adequately opacified for evaluation. No pulmonary embolism. Main pulmonary artery is normal in caliber. MEDIASTINUM: The heart and pericardium demonstrate no acute abnormality. There is no acute abnormality of the thoracic aorta. LYMPH NODES: Subcarinal lymph node measures 1.7 cm, image 169/5, unchanged from previous exam. Right paratracheal lymph node measures 1.2 cm, image 115/5. Previously 1.4 cm. Multiple prominent upper abdominal lymph nodes are identified with a similar appearance to the previous exam of unknown clinical significance. Index left periaortic node measures 1.3 cm, image 128/3. Previously 1 cm. Gastrohepatic ligament node measures 1.1 cm image 113/3. Previously 1 cm. LUNGS AND PLEURA: High-grade focal luminal narrowing of the right middle lobe bronchus is again noted and appears unchanged from the previous exam. Associated chronic partial atelectasis of the right middle lobe with volume loss. Chronic subsegmental atelectasis and volume loss involving the posterior left upper lobe and Lingula. Calcified granulomas identified within the apical segment of the right upper lobe. No pleural fluid or airspace consolidation. Diffuse bronchial wall thickening. There are scattered  lung nodules identified bilaterally which have a nonspecific  appearance the largest nodule is in the right lung base measuring 1 x 0.6 cm, image 94/4. On the previous exam, this measured 1.1 x 0.8 cm. Posterior left upper lobe lung nodule is unchanged measuring 0.4 cm, image 51/4. UPPER ABDOMEN: Postoperative changes at the GE junction possibly from prior fundoplication wrap. Gaseous distention of the stomach is noted, which results in asymmetric elevation of the left hemidiaphragm. Postoperative change from prior distal pancreatectomy. SOFT TISSUES AND BONES: There are new fractures involving the anterolateral aspect of the left 8th and 9th ribs. Chronic right lateral rib fracture deformities are noted. IMPRESSION: 1. No evidence of pulmonary embolism. 2. High-grade focal luminal narrowing of the right middle lobe bronchus with associated chronic partial atelectasis and volume loss, unchanged from previous exam. 3. Scattered bilateral lung nodules, largest in the right lung base measuring 1 x 0.6 cm, previously 1.1 x 0.8 cm. Posterior left upper lobe nodule is unchanged at 0.4 cm. 4. Multiple prominent upper abdominal lymph nodes, similar to previous exam, of unknown clinical significance. Electronically signed by: Waddell Calk MD 01/16/2024 09:27 AM EDT RP Workstation: [id number]   CT ABDOMEN PELVIS W CONTRAST Result Date: 01/16/2024 EXAM: CT ABDOMEN AND PELVIS WITH CONTRAST 01/16/2024 08:55:15 AM TECHNIQUE: CT of the abdomen and pelvis was performed with the administration of intravenous contrast (75mL iohexol  (OMNIPAQUE ) 350 MG/ML injection). Multiplanar reformatted images are provided for review. Automated exposure control, iterative reconstruction, and/or weight based adjustment of the mA/kV was utilized to reduce the radiation dose to as low as reasonably achievable. COMPARISON: CT of the abdomen and pelvis dated 10/17/2000. CLINICAL HISTORY: Abdominal pain, acute, nonlocalized. Reports choking on oral medication this morning. Patient thinks there is pill stuck in  esophagus. Patient able to talk. No respiratory distress. Patient has recurrent choking episode. FINDINGS: LOWER CHEST: Bronchiectasis and streaky peribronchovascular consolidation present within the base of the right middle lobe, as before. LIVER: The liver is unremarkable. GALLBLADDER AND BILE DUCTS: The patient is status post cholecystectomy. SPLEEN: The patient is status post splenectomy. PANCREAS: The patient is status post distal pancreatectomy. ADRENAL GLANDS: No acute abnormality. KIDNEYS, URETERS AND BLADDER: No stones in the kidneys or ureters. No hydronephrosis. No perinephric or periureteral stranding. Urinary bladder is unremarkable. GI AND BOWEL: The distal sigmoid colon remains collapsed and featureless. The proximal sigmoid colon remains mildly dilated with air and formed fecal material. The patient is status post partial left colectomy. There are loops of moderately distended mid to distal small bowel, which measure up to approximately 3.2 cm in diameter. There is no abrupt transition point. Findings are compatible with ileus or partial small bowel obstruction. PERITONEUM AND RETROPERITONEUM: The patient is again noted to be status post abdominal hernia repair. VASCULATURE: Moderate calcific coronary artery disease. LYMPH NODES: No lymphadenopathy. REPRODUCTIVE ORGANS: The patient is status post hysterectomy. There are no abnormal adnexal masses. BONES AND SOFT TISSUES: No acute osseous abnormality. No focal soft tissue abnormality. IMPRESSION: 1. Moderately distended mid to distal small bowel loops, measuring up to 3.2 cm in diameter, without an abrupt transition point, compatible with ileus or partial small bowel obstruction. 2. Collapsed and featureless distal sigmoid colon with mildly dilated proximal sigmoid colon containing air and formed fecal material. Electronically signed by: evalene berrigan 01/16/2024 09:21 AM EDT RP Workstation: [id number]   DG Chest 1 View Result Date:  01/15/2024 CLINICAL DATA:  Choking episode earlier today. Patient choked on oral medications. Feels like pills stuck in  the esophagus. EXAM: CHEST  1 VIEW COMPARISON:  Chest and ribs 05/01/2023.  CT chest 05/04/2023 FINDINGS: Shallow inspiration with elevation of the left hemidiaphragm. Linear opacities in the lung bases are similar to the prior study, likely representing scarring. There is a circumscribed nodule projecting over the right lower lung. This is also unchanged. No pleural effusion or pneumothorax. Mediastinal contours appear intact. Calcification of the aorta. Old rib fractures. No radiopaque foreign bodies are identified. IMPRESSION: Shallow inspiration. Chronic linear and nodular opacities in the lung bases as described. No evidence of active pulmonary disease. Electronically Signed   By: Elsie Gravely M.D.   On: 01/15/2024 19:41     Discharge Instructions: Discharge Instructions     Call MD for:  persistant nausea and vomiting   Complete by: As directed    Call MD for:  severe uncontrolled pain   Complete by: As directed    Call MD for:  temperature >100.4   Complete by: As directed    Diet Carb Modified   Complete by: As directed    Discharge instructions   Complete by: As directed    Thank you for allowing us  to be part of your care. You were hospitalized for trouble swallowing and for a partial bowel obstruction. We treated you with pain medicine and bowel rest.   See the changes in your medications and management of your chronic conditions below:  We have made changes to your diabetes medications.    For your Diabetes: -We have STARTED you on these following medication:  - Empagliflozin  once daily before breakfast -We have STOPPED the following medications:  - Semaglutide injection  - Pioglitazone    We also recommend that you not continue the Levofloxacin antibiotic for your surgical hand wound, but do continue applying the mupirocin  ointment twice daily until you  can follow up with dermatology.  We also recommend that you take one tablet (300 mg) of gabapentin , four times a day, for the first three days after leaving the hospital. After three days, you may take two tablets (600 mg) of gabapentin  four times a day.   FOLLOW UP APPOINTMENTS:  Please see Landry Georgi, your PCP, in 7 to 10 days.  You should also be contacted to arrange Home Health physical therapy through [facility].   Please make sure to take your medications as prescribed.   Please call your PCP or our clinic if you have any questions or concerns, we may be able to help and keep you from a long and expensive emergency room wait. Our clinic and after hours phone number is [phone]. The best time to call is Monday through Friday 9 am to 4 pm but there is always someone available 24/7 if you have an emergency. If you need medication refills please notify your pharmacy one week in advance and they will send us  a request.   We are glad you are feeling better,  Viktoria King Internal Medicine Inpatient Teaching Service at [facility]   Increase activity slowly   Complete by: As directed        Signed: King Viktoria, DO 01/19/2024, 4:20 PM

## 2024-01-19 NOTE — Discharge Instructions (Addendum)
 Thank you for allowing us  to be part of your care. You were hospitalized for trouble swallowing and for a partial bowel obstruction. We treated you with pain medicine and bowel rest.   See the changes in your medications and management of your chronic conditions below:  We have made changes to your diabetes medications.    For your Diabetes: -We have STARTED you on these following medication:  - Empagliflozin  once daily before breakfast -We have STOPPED the following medications:  - Semaglutide injection  - Pioglitazone    We also recommend that you not continue the Levofloxacin antibiotic for your surgical hand wound, but do continue applying the mupirocin  ointment twice daily until you can follow up with dermatology.  We also recommend that you take one tablet (300 mg) of gabapentin , four times a day, for the first three days after leaving the hospital. After three days, you may take two tablets (600 mg) of gabapentin  four times a day.   FOLLOW UP APPOINTMENTS:  Please see Landry Georgi, your PCP, in 7 to 10 days.  You should also be contacted to arrange Home Health physical therapy through [facility].   Please make sure to take your medications as prescribed.   Please call your PCP or our clinic if you have any questions or concerns, we may be able to help and keep you from a long and expensive emergency room wait. Our clinic and after hours phone number is [phone]. The best time to call is Monday through Friday 9 am to 4 pm but there is always someone available 24/7 if you have an emergency. If you need medication refills please notify your pharmacy one week in advance and they will send us  a request.   We are glad you are feeling better,  Viktoria King Internal Medicine Inpatient Teaching Service at [facility]

## 2024-01-19 NOTE — Progress Notes (Signed)
 Mobility Specialist Progress Note:    01/19/24 1014  Mobility  Activity Ambulated with assistance (In hallway)  Level of Assistance Standby assist, set-up cues, supervision of patient - no hands on  Assistive Device Front wheel walker  Distance Ambulated (ft) 240 ft  Activity Response Tolerated well  Mobility Referral Yes  Mobility visit 1 Mobility  Mobility Specialist Start Time (ACUTE ONLY) 0946  Mobility Specialist Stop Time (ACUTE ONLY) 0959  Mobility Specialist Time Calculation (min) (ACUTE ONLY) 13 min   Received pt in bed and agreeable to mobility. No physical assistance needed. No c/o. Returned to room without fault. Left in bed with alarm on. Personal belongings and call light within reach. All needs met.  Lavanda Pollack Mobility Specialist  Please contact via [employer] or  Rehab Office [phone]

## 2024-01-19 NOTE — Progress Notes (Signed)
 Physical Therapy Treatment Patient Details Name: Nicole Montgomery MRN: [id number] DOB: June 23, 1940 Today's Date: 01/19/2024   History of Present Illness Nicole Montgomery is a 83 y.o. female presented to [facility] ED 01/15/24 with trouble swallowing. CTAP in ED indicates concern for ileus or partial small bowel obstruction. PMHx: pancreatic adeno s/p partial pancreatectomy (2002), fundoplication (2005), sarcoidosis, trigeminal neuralgia, IBS, basal cell skin cancers, T2DM, HTN, and HLD.    PT Comments  Received pt semi-reclined in bed and agreeable to OOB mobility prior to discharge. Pt performed bed mobility with HOB elevated and use of bedrails mod I and performed all transfers with RW and mod I throughout session. Pt ambulated >540ft x 2 trials with RW and mod I/supervision to/from stairwell and navigated 11 6in steps with 1 handrail and CGA fading to close supervision ascending and descending with a step to pattern to simulate home setup. Returned to room, ambulated in/out of bathroom with RW mod I, and with loose BM (required CGA for standing balance while washing lower body). Changed gowns, performed hand hygiene at sink mod I, and left sitting in recliner with all needs met and medical team at bedside. Current recommendations remain appropriate and pt verbalized feeling prepared for discharge.     If plan is discharge home, recommend the following: Help with stairs or ramp for entrance;Assist for transportation;Assistance with cooking/housework   Can travel by private vehicle        Equipment Recommendations  None recommended by PT (already has DME)    Recommendations for Other Services       Precautions / Restrictions Precautions Precautions: Fall Restrictions Weight Bearing Restrictions Per Provider Order: No     Mobility  Bed Mobility Overal bed mobility: Modified Independent Bed Mobility: Rolling, Supine to Sit, Sit to Supine Rolling: Modified independent (Device/Increase time)   Supine  to sit: HOB elevated, Modified independent (Device/Increase time), Used rails Sit to supine: Modified independent (Device/Increase time), HOB elevated, Used rails     Patient Response: Cooperative  Transfers Overall transfer level: Modified independent Equipment used: Rolling walker (2 wheels) Transfers: Sit to/from Stand, Bed to chair/wheelchair/BSC Sit to Stand: Modified independent (Device/Increase time) Stand pivot transfers: Modified independent (Device/Increase time)              Ambulation/Gait Ambulation/Gait assistance: Modified independent (Device/Increase time), Supervision Gait Distance (Feet): 500 Feet Assistive device: Rolling walker (2 wheels) Gait Pattern/deviations: Step-through pattern, Decreased stride length Gait velocity: decreased Gait velocity interpretation: 1.31 - 2.62 ft/sec, indicative of limited community ambulator       Stairs Stairs: Yes Stairs assistance: Contact guard assist, Supervision Stair Management: One rail Right, Forwards, Step to pattern Number of Stairs: 11 (6in)     Wheelchair Mobility     Tilt Bed Tilt Bed Patient Response: Cooperative  Modified Rankin (Stroke Patients Only)       Balance Overall balance assessment: Modified Independent Sitting-balance support: Feet supported, No upper extremity supported Sitting balance-Leahy Scale: Good     Standing balance support: Bilateral upper extremity supported, Reliant on assistive device for balance (RW) Standing balance-Leahy Scale: Good Standing balance comment: BUE support on RW                            Communication Communication Communication: No apparent difficulties Factors Affecting Communication: Hearing impaired  Cognition Arousal: Alert Behavior During Therapy: WFL for tasks assessed/performed   PT - Cognitive impairments: No apparent impairments  Following commands: Intact      Cueing Cueing  Techniques: Verbal cues, Visual cues  Exercises      General Comments        Pertinent Vitals/Pain Pain Assessment Pain Assessment: No/denies pain    Home Living                          Prior Function            PT Goals (current goals can now be found in the care plan section) Acute Rehab PT Goals Patient Stated Goal: Return Home PT Goal Formulation: With patient Time For Goal Achievement: 01/24/24 Potential to Achieve Goals: Good Progress towards PT goals: Progressing toward goals    Frequency    Min 2X/week      PT Plan      Co-evaluation              AM-PAC PT 6 Clicks Mobility   Outcome Measure  Help needed turning from your back to your side while in a flat bed without using bedrails?: None Help needed moving from lying on your back to sitting on the side of a flat bed without using bedrails?: None Help needed moving to and from a bed to a chair (including a wheelchair)?: None Help needed standing up from a chair using your arms (e.g., wheelchair or bedside chair)?: None Help needed to walk in hospital room?: None Help needed climbing 3-5 steps with a railing? : A Little 6 Click Score: 23    End of Session   Activity Tolerance: Patient tolerated treatment well Patient left: in chair;with call bell/phone within reach Nurse Communication: Mobility status PT Visit Diagnosis: Muscle weakness (generalized) (M62.81);Unsteadiness on feet (R26.81)     Time: 8973-8952 PT Time Calculation (min) (ACUTE ONLY): 21 min  Charges:    $Therapeutic Activity: 8-22 mins PT General Charges $$ ACUTE PT VISIT: 1 Visit                     Therisa Stains PT, DPT Therisa HERO Zaunegger 01/19/2024, 11:12 AM

## 2024-01-19 NOTE — Telephone Encounter (Signed)
 Patient Product/process development scientist completed.    The patient is insured through [employer]/ [employer]. Patient has Medicare and is not eligible for a copay card, but may be able to apply for patient assistance or Medicare RX Payment Plan (Patient Must reach out to their plan, if eligible for payment plan), if available.    Ran test claim for Jardiance  10 mg and the current 30 day co-pay is $0.00.   This test claim was processed through [facility]- copay amounts may vary at other pharmacies due to pharmacy/plan contracts, or as the patient moves through the different stages of their insurance plan.     Reyes Sharps, CPHT Pharmacy Technician III Certified Patient Advocate [facility] Patient Advocate Team Direct Number: [phone]  Fax: [phone]

## 2024-01-19 NOTE — Progress Notes (Signed)
 SLP Cancellation Note  Patient Details Name: Nicole Montgomery MRN: [id number] DOB: [birth year]   Cancelled treatment:       Reason Eval/Treat Not Completed: Other (comment) Patient has been advanced to soft digestion/regular solids diet by surgery today. Per patient, she had regular solids at lunch (salmon, etc) and had no problems with swallowing. She feels 100% better and is expecting discharge back home today. SLP will s/o at this time.  [name], MA, CCC-SLP Speech Therapy

## 2024-01-19 NOTE — Progress Notes (Signed)
 Subjective: CC: Tolerating liquids without abdominal pain, n/v. Passing flatus. Liquid BM yesterday.   Objective: Vital signs in last 24 hours: Temp:  [97.7 F (36.5 C)-98.6 F (37 C)] 97.7 F (36.5 C) (08/05 0806) Pulse Rate:  [58-70] 70 (08/05 0806) Resp:  [16-17] 17 (08/05 0806) BP: (134-149)/(67-83) 143/83 (08/05 0806) SpO2:  [96 %-99 %] 96 % (08/05 0806) Last BM Date : 01/17/24  Intake/Output from previous day: No intake/output data recorded. Intake/Output this shift: No intake/output data recorded.  PE: Gen:  Alert, NAD, pleasant Abd: Soft, ND, NT, +BS  Lab Results:  Recent Labs    01/17/24 0657 01/18/24 0745  WBC 11.8* 12.2*  HGB 13.2 13.5  HCT 41.8 44.2  PLT 454* 421*   BMET Recent Labs    01/17/24 0657 01/18/24 0745  NA 139 141  K 3.3* 4.2  CL 108 112*  CO2 15* 13*  GLUCOSE 135* 141*  BUN 15 14  CREATININE 0.96 0.90  CALCIUM 8.8* 8.8*   PT/INR No results for input(s): LABPROT, INR in the last 72 hours. CMP     Component Value Date/Time   NA 141 01/18/2024 0745   K 4.2 01/18/2024 0745   CL 112 (H) 01/18/2024 0745   CO2 13 (L) 01/18/2024 0745   GLUCOSE 141 (H) 01/18/2024 0745   BUN 14 01/18/2024 0745   CREATININE 0.90 01/18/2024 0745   CALCIUM 8.8 (L) 01/18/2024 0745   PROT 6.1 (L) 01/17/2024 0657   ALBUMIN  3.1 (L) 01/17/2024 0657   AST 27 01/17/2024 0657   ALT 17 01/17/2024 0657   ALKPHOS 75 01/17/2024 0657   BILITOT 1.1 01/17/2024 0657   GFRNONAA >60 01/18/2024 0745   GFRAA  10/23/2007 1248    >60        The eGFR has been calculated using the MDRD equation. This calculation has not been validated in all clinical   Lipase     Component Value Date/Time   LIPASE 28 01/16/2024 0053    Studies/Results: DG Abd Portable 1V Result Date: 01/18/2024 CLINICAL DATA:  [id number] SBO (small bowel obstruction) (HCC) [id number] EXAM: DG ABD PORTABLE 1V COMPARISON:  None Available. FINDINGS: A single gas-filled segments of small bowel  is mildly dilated in the mid to upper abdomen measuring 3.4 cm. Enteric contrast throughout the colon. No pneumoperitoneum. No organomegaly or radiopaque calculi. No acute fracture or destructive lesion. The lung bases are clear.Multilevel degenerative disc disease of the spine. Herniorrhaphy markers. IMPRESSION: Similar appearance of a single, mildly dilated gas-filled segment of small bowel in the upper abdomen measuring 3.4 cm. Enteric contrast present within the colon, suggesting that this reflect changes of a focal ileus. Electronically Signed   By: Rogelia Myers M.D.   On: 01/18/2024 08:27    Anti-infectives: Anti-infectives (From admission, onward)    None        Assessment/Plan SBO  - Clinically and radiographically improving. She has contrast in her colon on xray, reports resolution of symptoms, ND/NT on exam and is tolerating liquids with return of bowel function. Will advance diet to soft. If tolerates diet advancement, okay for discharge from our standpoint. Please notify us  if patient does not tolerate diet advancement, otherwise general surgery will sign off.   FEN - Soft, IVF per primary  VTE - SCDs, sqh ID - None   I reviewed  nursing notes, hospitalist notes, last 24 h vitals and pain scores, last 48 h intake and output, last 24 h labs and  trends, and last 24 h imaging results.    LOS: 3 days    Nicole Montgomery, [facility] 01/19/2024, 9:54 AM Please see Amion for pager number during day hours 7:00am-4:30pm

## 2024-01-28 DIAGNOSIS — Z789 Other specified health status: Secondary | ICD-10-CM | POA: Diagnosis not present

## 2024-01-28 DIAGNOSIS — E1159 Type 2 diabetes mellitus with other circulatory complications: Secondary | ICD-10-CM | POA: Diagnosis not present

## 2024-01-28 DIAGNOSIS — Z8719 Personal history of other diseases of the digestive system: Secondary | ICD-10-CM | POA: Diagnosis not present

## 2024-01-28 DIAGNOSIS — Z6823 Body mass index (BMI) 23.0-23.9, adult: Secondary | ICD-10-CM | POA: Diagnosis not present

## 2024-01-28 DIAGNOSIS — R1310 Dysphagia, unspecified: Secondary | ICD-10-CM | POA: Diagnosis not present

## 2024-01-28 DIAGNOSIS — R918 Other nonspecific abnormal finding of lung field: Secondary | ICD-10-CM | POA: Diagnosis not present

## 2024-02-15 DIAGNOSIS — E1159 Type 2 diabetes mellitus with other circulatory complications: Secondary | ICD-10-CM | POA: Diagnosis not present

## 2024-02-15 DIAGNOSIS — M1611 Unilateral primary osteoarthritis, right hip: Secondary | ICD-10-CM | POA: Diagnosis not present

## 2024-03-04 DIAGNOSIS — L570 Actinic keratosis: Secondary | ICD-10-CM | POA: Diagnosis not present

## 2024-03-04 DIAGNOSIS — C44622 Squamous cell carcinoma of skin of right upper limb, including shoulder: Secondary | ICD-10-CM | POA: Diagnosis not present

## 2024-03-04 DIAGNOSIS — D485 Neoplasm of uncertain behavior of skin: Secondary | ICD-10-CM | POA: Diagnosis not present

## 2024-03-08 DIAGNOSIS — N1831 Chronic kidney disease, stage 3a: Secondary | ICD-10-CM | POA: Diagnosis not present

## 2024-03-08 DIAGNOSIS — Z23 Encounter for immunization: Secondary | ICD-10-CM | POA: Diagnosis not present

## 2024-03-08 DIAGNOSIS — E1159 Type 2 diabetes mellitus with other circulatory complications: Secondary | ICD-10-CM | POA: Diagnosis not present

## 2024-03-08 DIAGNOSIS — E782 Mixed hyperlipidemia: Secondary | ICD-10-CM | POA: Diagnosis not present

## 2024-03-08 DIAGNOSIS — Z6824 Body mass index (BMI) 24.0-24.9, adult: Secondary | ICD-10-CM | POA: Diagnosis not present

## 2024-03-08 DIAGNOSIS — I152 Hypertension secondary to endocrine disorders: Secondary | ICD-10-CM | POA: Diagnosis not present

## 2024-03-14 DIAGNOSIS — C44622 Squamous cell carcinoma of skin of right upper limb, including shoulder: Secondary | ICD-10-CM | POA: Diagnosis not present

## 2024-03-14 DIAGNOSIS — L570 Actinic keratosis: Secondary | ICD-10-CM | POA: Diagnosis not present

## 2024-03-16 DIAGNOSIS — M1611 Unilateral primary osteoarthritis, right hip: Secondary | ICD-10-CM | POA: Diagnosis not present

## 2024-03-16 DIAGNOSIS — E1159 Type 2 diabetes mellitus with other circulatory complications: Secondary | ICD-10-CM | POA: Diagnosis not present

## 2024-03-23 DIAGNOSIS — B379 Candidiasis, unspecified: Secondary | ICD-10-CM | POA: Diagnosis not present

## 2024-03-23 DIAGNOSIS — E1159 Type 2 diabetes mellitus with other circulatory complications: Secondary | ICD-10-CM | POA: Diagnosis not present

## 2024-03-23 DIAGNOSIS — Z6824 Body mass index (BMI) 24.0-24.9, adult: Secondary | ICD-10-CM | POA: Diagnosis not present

## 2024-03-23 DIAGNOSIS — I152 Hypertension secondary to endocrine disorders: Secondary | ICD-10-CM | POA: Diagnosis not present

## 2024-04-16 DIAGNOSIS — M1611 Unilateral primary osteoarthritis, right hip: Secondary | ICD-10-CM | POA: Diagnosis not present

## 2024-04-16 DIAGNOSIS — E1159 Type 2 diabetes mellitus with other circulatory complications: Secondary | ICD-10-CM | POA: Diagnosis not present
